# Patient Record
Sex: Female | Born: 1935
Health system: Southern US, Community
[De-identification: ages and names within clinical notes are randomized; demographics above are authoritative.]

## PROBLEM LIST (undated history)

## (undated) DIAGNOSIS — G629 Polyneuropathy, unspecified: Secondary | ICD-10-CM

## (undated) DIAGNOSIS — Z973 Presence of spectacles and contact lenses: Secondary | ICD-10-CM

## (undated) DIAGNOSIS — K589 Irritable bowel syndrome without diarrhea: Secondary | ICD-10-CM

## (undated) DIAGNOSIS — E278 Other specified disorders of adrenal gland: Secondary | ICD-10-CM

## (undated) DIAGNOSIS — I700 Atherosclerosis of aorta: Secondary | ICD-10-CM

## (undated) DIAGNOSIS — C649 Malignant neoplasm of unspecified kidney, except renal pelvis: Secondary | ICD-10-CM

## (undated) DIAGNOSIS — E7800 Pure hypercholesterolemia, unspecified: Secondary | ICD-10-CM

## (undated) DIAGNOSIS — M810 Age-related osteoporosis without current pathological fracture: Secondary | ICD-10-CM

## (undated) DIAGNOSIS — N183 Chronic kidney disease, stage 3 unspecified: Secondary | ICD-10-CM

## (undated) DIAGNOSIS — C799 Secondary malignant neoplasm of unspecified site: Secondary | ICD-10-CM

## (undated) DIAGNOSIS — I454 Nonspecific intraventricular block: Secondary | ICD-10-CM

## (undated) DIAGNOSIS — L989 Disorder of the skin and subcutaneous tissue, unspecified: Secondary | ICD-10-CM

## (undated) DIAGNOSIS — C679 Malignant neoplasm of bladder, unspecified: Secondary | ICD-10-CM

## (undated) DIAGNOSIS — N200 Calculus of kidney: Secondary | ICD-10-CM

## (undated) DIAGNOSIS — E782 Mixed hyperlipidemia: Secondary | ICD-10-CM

## (undated) DIAGNOSIS — Z972 Presence of dental prosthetic device (complete) (partial): Secondary | ICD-10-CM

## (undated) DIAGNOSIS — R600 Localized edema: Secondary | ICD-10-CM

## (undated) DIAGNOSIS — I10 Essential (primary) hypertension: Secondary | ICD-10-CM

## (undated) DIAGNOSIS — E039 Hypothyroidism, unspecified: Secondary | ICD-10-CM

## (undated) DIAGNOSIS — Z87442 Personal history of urinary calculi: Secondary | ICD-10-CM

## (undated) DIAGNOSIS — I8290 Acute embolism and thrombosis of unspecified vein: Secondary | ICD-10-CM

## (undated) DIAGNOSIS — I8393 Asymptomatic varicose veins of bilateral lower extremities: Secondary | ICD-10-CM

## (undated) DIAGNOSIS — I129 Hypertensive chronic kidney disease with stage 1 through stage 4 chronic kidney disease, or unspecified chronic kidney disease: Secondary | ICD-10-CM

## (undated) HISTORY — DX: Chronic kidney disease, stage 3 unspecified: N18.30

## (undated) HISTORY — DX: Irritable bowel syndrome, unspecified: K58.9

## (undated) HISTORY — PX: COLON SURGERY: SHX602

## (undated) HISTORY — PX: OTHER SURGICAL HISTORY: SHX169

## (undated) HISTORY — PX: ABDOMINAL HYSTERECTOMY: SHX81

## (undated) HISTORY — DX: Hypothyroidism, unspecified: E03.9

## (undated) HISTORY — DX: Hypertensive chronic kidney disease with stage 1 through stage 4 chronic kidney disease, or unspecified chronic kidney disease: I12.9

## (undated) HISTORY — DX: Atherosclerosis of aorta: I70.0

## (undated) HISTORY — DX: Mixed hyperlipidemia: E78.2

## (undated) HISTORY — DX: Malignant neoplasm of bladder, unspecified: C67.9

## (undated) HISTORY — DX: Calculus of kidney: N20.0

## (undated) HISTORY — PX: APPENDECTOMY: SHX54

## (undated) HISTORY — PX: CATARACT EXTRACTION: SUR2

## (undated) HISTORY — DX: Secondary malignant neoplasm of unspecified site: C79.9

## (undated) HISTORY — DX: Pure hypercholesterolemia, unspecified: E78.00

## (undated) HISTORY — DX: Other specified disorders of adrenal gland: E27.8

---

## 1988-09-27 HISTORY — PX: EYE SURGERY: SHX253

## 2009-05-12 HISTORY — PX: CATARACT EXTRACTION: SUR2

## 2009-11-19 HISTORY — PX: COLONOSCOPY: SHX174

## 2010-09-27 HISTORY — PX: KNEE SURGERY: SHX244

## 2017-01-25 HISTORY — PX: BLADDER SURGERY: SHX569

## 2018-04-26 DIAGNOSIS — Z01818 Encounter for other preprocedural examination: Secondary | ICD-10-CM | POA: Diagnosis not present

## 2018-05-30 DIAGNOSIS — N1330 Unspecified hydronephrosis: Secondary | ICD-10-CM | POA: Diagnosis not present

## 2018-05-30 DIAGNOSIS — B9689 Other specified bacterial agents as the cause of diseases classified elsewhere: Secondary | ICD-10-CM

## 2018-05-30 DIAGNOSIS — A09 Infectious gastroenteritis and colitis, unspecified: Secondary | ICD-10-CM | POA: Diagnosis not present

## 2018-05-30 DIAGNOSIS — N3000 Acute cystitis without hematuria: Secondary | ICD-10-CM | POA: Diagnosis not present

## 2018-05-30 DIAGNOSIS — A0472 Enterocolitis due to Clostridium difficile, not specified as recurrent: Secondary | ICD-10-CM | POA: Diagnosis not present

## 2018-05-30 DIAGNOSIS — E871 Hypo-osmolality and hyponatremia: Secondary | ICD-10-CM

## 2018-05-31 DIAGNOSIS — A0472 Enterocolitis due to Clostridium difficile, not specified as recurrent: Secondary | ICD-10-CM | POA: Diagnosis not present

## 2018-05-31 DIAGNOSIS — A09 Infectious gastroenteritis and colitis, unspecified: Secondary | ICD-10-CM | POA: Diagnosis not present

## 2018-05-31 DIAGNOSIS — N1330 Unspecified hydronephrosis: Secondary | ICD-10-CM | POA: Diagnosis not present

## 2018-05-31 DIAGNOSIS — N3000 Acute cystitis without hematuria: Secondary | ICD-10-CM | POA: Diagnosis not present

## 2018-06-01 DIAGNOSIS — A0472 Enterocolitis due to Clostridium difficile, not specified as recurrent: Secondary | ICD-10-CM | POA: Diagnosis not present

## 2018-06-01 DIAGNOSIS — N1330 Unspecified hydronephrosis: Secondary | ICD-10-CM | POA: Diagnosis not present

## 2018-06-01 DIAGNOSIS — N3000 Acute cystitis without hematuria: Secondary | ICD-10-CM | POA: Diagnosis not present

## 2018-06-01 DIAGNOSIS — A09 Infectious gastroenteritis and colitis, unspecified: Secondary | ICD-10-CM | POA: Diagnosis not present

## 2018-06-02 DIAGNOSIS — N3000 Acute cystitis without hematuria: Secondary | ICD-10-CM | POA: Diagnosis not present

## 2018-06-02 DIAGNOSIS — A0472 Enterocolitis due to Clostridium difficile, not specified as recurrent: Secondary | ICD-10-CM | POA: Diagnosis not present

## 2018-06-02 DIAGNOSIS — N1330 Unspecified hydronephrosis: Secondary | ICD-10-CM | POA: Diagnosis not present

## 2018-06-02 DIAGNOSIS — A09 Infectious gastroenteritis and colitis, unspecified: Secondary | ICD-10-CM | POA: Diagnosis not present

## 2018-06-03 DIAGNOSIS — A09 Infectious gastroenteritis and colitis, unspecified: Secondary | ICD-10-CM | POA: Diagnosis not present

## 2018-06-03 DIAGNOSIS — N1330 Unspecified hydronephrosis: Secondary | ICD-10-CM | POA: Diagnosis not present

## 2018-06-03 DIAGNOSIS — A0472 Enterocolitis due to Clostridium difficile, not specified as recurrent: Secondary | ICD-10-CM | POA: Diagnosis not present

## 2018-06-03 DIAGNOSIS — N3000 Acute cystitis without hematuria: Secondary | ICD-10-CM | POA: Diagnosis not present

## 2018-06-04 DIAGNOSIS — A09 Infectious gastroenteritis and colitis, unspecified: Secondary | ICD-10-CM | POA: Diagnosis not present

## 2018-06-04 DIAGNOSIS — A0472 Enterocolitis due to Clostridium difficile, not specified as recurrent: Secondary | ICD-10-CM | POA: Diagnosis not present

## 2018-06-04 DIAGNOSIS — N3000 Acute cystitis without hematuria: Secondary | ICD-10-CM | POA: Diagnosis not present

## 2018-06-04 DIAGNOSIS — N1330 Unspecified hydronephrosis: Secondary | ICD-10-CM | POA: Diagnosis not present

## 2018-06-05 DIAGNOSIS — A0472 Enterocolitis due to Clostridium difficile, not specified as recurrent: Secondary | ICD-10-CM | POA: Diagnosis not present

## 2018-06-05 DIAGNOSIS — A09 Infectious gastroenteritis and colitis, unspecified: Secondary | ICD-10-CM | POA: Diagnosis not present

## 2018-06-05 DIAGNOSIS — N3000 Acute cystitis without hematuria: Secondary | ICD-10-CM | POA: Diagnosis not present

## 2018-06-05 DIAGNOSIS — N1330 Unspecified hydronephrosis: Secondary | ICD-10-CM | POA: Diagnosis not present

## 2018-08-22 ENCOUNTER — Encounter: Payer: Self-pay | Admitting: Gastroenterology

## 2018-08-31 ENCOUNTER — Encounter: Payer: Self-pay | Admitting: Gastroenterology

## 2018-09-08 ENCOUNTER — Encounter: Payer: Self-pay | Admitting: Gastroenterology

## 2018-09-11 ENCOUNTER — Encounter: Payer: Self-pay | Admitting: Gastroenterology

## 2018-09-11 ENCOUNTER — Ambulatory Visit: Payer: Medicare Other | Admitting: Gastroenterology

## 2018-09-11 VITALS — BP 148/80 | HR 62 | Ht 65.5 in | Wt 137.1 lb

## 2018-09-11 DIAGNOSIS — A498 Other bacterial infections of unspecified site: Secondary | ICD-10-CM | POA: Diagnosis not present

## 2018-09-11 MED ORDER — VANCOMYCIN HCL 125 MG PO CAPS: ORAL_CAPSULE | ORAL | 0 refills | Status: DC

## 2018-09-11 NOTE — Patient Instructions (Addendum)
If you are age 82 or older, your body mass index should be between 23-30. Your Body mass index is 22.47 kg/m. If this is out of the aforementioned range listed, please consider follow up with your Primary Care Provider.  If you are age 52 or younger, your body mass index should be between 19-25. Your Body mass index is 22.47 kg/m. If this is out of the aformentioned range listed, please consider follow up with your Primary Care Provider.   We have sent the following medications to your pharmacy for you to pick up at your convenience: Vancomycin 125 mg every other day x 14 days, then every 3rd day x 14 days.   Please call Dr. Leland Her nurse Karl Pock, RN)  in 2 weeks at [phone]  to let her now how you are doing.    Thank you,  Dr. Jackquline Denmark

## 2018-09-11 NOTE — Progress Notes (Signed)
Chief Complaint: Recurrent C. difficile diarrhea  Referring Provider: Dr. [name]      ASSESSMENT AND PLAN;   #1. Recurrent C. Diff (since 05/2018) 3 episodes.  Currently on pulsed vancomycin therapy. Last colon 10/2009 - neg except for mild pancolonic diverticulosis, s/p sigmoid resection.  Plan: - Vancomycin 125 mg every other day for 14 days followed by 125 mg p.o. every third day for 14 days. - Blood tests done last week at Dr. Nyra Capes office. - Discussed regarding Dificid and fecal transplantation.  Hopefully we will not need. - Avoid antibiotics. - Can continue probiotics for now. - She is to promptly call us if she starts having diarrhea after she finishes vancomycin. - Meanwhile, I will also find out if, we as a group, are enrolled in open-biome [facility] program for oral FMT capsules.    HPI:    Kayla Bowers is a 84 y.o. female  Took antibiotics for over a year due to recurrent urinary tract infections/bladder cancer Started having increasing diarrhea around September 2019 around Labor Day requiring hospitalization.  Found to have C. Difficile.  Treated with vancomycin.  Her diarrhea completely resolves as long as she is on vancomycin.  However, when she stops taking vancomycin it does return.  She had 3 episodes.  Currently on tapering vancomycin and is down to 1/day with last dose today.  No diarrhea now.  She has normal bowel movements.  Has been taking probiotics  Lost 3 pounds since September 2019  No melena or hematochezia  No pets at home   Past Medical History:  Diagnosis Date  . Bladder cancer ([place])   . Hypercholesteremia   . Kidney stone     Past Surgical History:  Procedure Laterality Date  . ABDOMINAL HYSTERECTOMY    . APPENDECTOMY    . BLADDER SURGERY  01/2017   removal of a tumor  . CATARACT EXTRACTION    . COLON SURGERY     took out 12 inches  . COLONOSCOPY  11/19/2009   Mild pancolonic diverticulitis. Status post sigmoid resection.  Small internal hemorrhoid. Otherwies normal colonsocopy.   Wilson Singer Tuck    . Vericose Vein Stripping      Family History  Problem Relation Age of Onset  . Colon cancer Neg Hx   . Esophageal cancer Neg Hx     Social History   Tobacco Use  . Smoking status: Former Research scientist (life sciences)  . Smokeless tobacco: Never Used  Substance Use Topics  . Alcohol use: Not Currently    Comment: quit 2 years ago   . Drug use: Never    Current Outpatient Medications  Medication Sig Dispense Refill  . FENOFIBRATE PO Take 1 tablet by mouth at bedtime.    Marland Kitchen HYDROCHLOROTHIAZIDE PO Take 1 tablet by mouth daily.    Marland Kitchen lactobacillus acidophilus (BACID) TABS tablet Take 1 tablet by mouth 2 (two) times daily.    . Multiple Vitamins-Minerals (MULTIVITAMIN ADULT PO) Take 1 tablet by mouth daily.    . Multiple Vitamins-Minerals (PRESERVISION AREDS PO) Take 1 tablet by mouth daily.    Marland Kitchen VANCOMYCIN HCL PO Take 1 tablet by mouth daily.     No current facility-administered medications for this visit.     Allergies  Allergen Reactions  . Flomax [Tamsulosin Hcl]     Review of Systems:  Constitutional: Denies fever, chills, diaphoresis, appetite change and fatigue.  HEENT: Denies photophobia, eye pain, redness, hearing loss, ear pain, congestion, sore throat, rhinorrhea, sneezing, mouth sores,  neck pain, neck stiffness and tinnitus.   Respiratory: Denies SOB, DOE, cough, chest tightness,  and wheezing.   Cardiovascular: Denies chest pain, palpitations and leg swelling.  Genitourinary: Denies dysuria, urgency, frequency, hematuria, flank pain and difficulty urinating.  Musculoskeletal: Denies myalgias, back pain, joint swelling, arthralgias and gait problem.  Skin: No rash.  Neurological: Denies dizziness, seizures, syncope, weakness, light-headedness, numbness and headaches.  Hematological: Denies adenopathy. Easy bruising, personal or family bleeding history  Psychiatric/Behavioral: No anxiety or depression      Physical Exam:    BP (!) 148/80   Pulse 62   Ht 5' 5.5" (1.664 m)   Wt 137 lb 2 oz (62.2 kg)   BMI 22.47 kg/m  Filed Weights   09/11/18 1334  Weight: 137 lb 2 oz (62.2 kg)   Constitutional:  Well-developed, in no acute distress. Psychiatric: Normal mood and affect. Behavior is normal. HEENT: Pupils normal.  Conjunctivae are normal. No scleral icterus. Neck supple.  Cardiovascular: Normal rate, regular rhythm. No edema Pulmonary/chest: Effort normal and breath sounds normal. No wheezing, rales or rhonchi. Abdominal: Soft, nondistended. Nontender. Bowel sounds active throughout. There are no masses palpable. No hepatomegaly.  Has incisional hernia. Rectal:  defered Neurological: Alert and oriented to person place and time. Skin: Skin is warm and dry. No rashes noted.    Carmell Austria, MD 09/11/2018, 2:03 PM  Cc: Dr. [name]

## 2018-10-12 ENCOUNTER — Telehealth: Payer: Self-pay | Admitting: Gastroenterology

## 2018-10-12 NOTE — Telephone Encounter (Signed)
Pt called to inform that she finished taking vancomycin yesterday.  Pt reports she is doing fine.  She reported one small episode when switching from qd to q3d.  FYI.

## 2018-10-12 NOTE — Telephone Encounter (Signed)
I am glad she is doing better I did discuss with the urology previously.  They will only treat again with antibiotics if she has significant symptoms and the cultures are positive. Drink plenty of water

## 2018-10-12 NOTE — Telephone Encounter (Signed)
Please review previous message and advise on next step in plan of care;  

## 2018-10-13 NOTE — Telephone Encounter (Signed)
Called and spoke with patient-patient informed of MD recommendations and is agreeable to plan of care; patient reports she is following up with urology on 11/23/2018 due to cancer cells showing back up in her urine sample; patient requested Dr. Lyndel Safe be made aware that she is being followed by urology due to those results; Patient verbalized understanding of information/instructions; Patient was advised to call back if questions/concerns arise;

## 2019-06-21 HISTORY — PX: KIDNEY STONE SURGERY: SHX686

## 2019-07-09 DIAGNOSIS — S22020A Wedge compression fracture of second thoracic vertebra, initial encounter for closed fracture: Secondary | ICD-10-CM | POA: Insufficient documentation

## 2019-07-09 HISTORY — DX: Wedge compression fracture of second thoracic vertebra, initial encounter for closed fracture: S22.020A

## 2019-10-04 DIAGNOSIS — I129 Hypertensive chronic kidney disease with stage 1 through stage 4 chronic kidney disease, or unspecified chronic kidney disease: Secondary | ICD-10-CM | POA: Diagnosis not present

## 2019-10-12 DIAGNOSIS — N183 Chronic kidney disease, stage 3 unspecified: Secondary | ICD-10-CM | POA: Diagnosis not present

## 2019-10-12 DIAGNOSIS — Z139 Encounter for screening, unspecified: Secondary | ICD-10-CM | POA: Diagnosis not present

## 2019-10-12 DIAGNOSIS — C679 Malignant neoplasm of bladder, unspecified: Secondary | ICD-10-CM | POA: Diagnosis not present

## 2019-10-12 DIAGNOSIS — Z0000 Encounter for general adult medical examination without abnormal findings: Secondary | ICD-10-CM | POA: Diagnosis not present

## 2019-10-12 DIAGNOSIS — Z1339 Encounter for screening examination for other mental health and behavioral disorders: Secondary | ICD-10-CM | POA: Diagnosis not present

## 2019-10-12 DIAGNOSIS — E278 Other specified disorders of adrenal gland: Secondary | ICD-10-CM | POA: Diagnosis not present

## 2019-10-12 DIAGNOSIS — I129 Hypertensive chronic kidney disease with stage 1 through stage 4 chronic kidney disease, or unspecified chronic kidney disease: Secondary | ICD-10-CM | POA: Diagnosis not present

## 2019-10-12 DIAGNOSIS — Z1331 Encounter for screening for depression: Secondary | ICD-10-CM | POA: Diagnosis not present

## 2019-10-12 DIAGNOSIS — Z136 Encounter for screening for cardiovascular disorders: Secondary | ICD-10-CM | POA: Diagnosis not present

## 2019-10-22 DIAGNOSIS — E279 Disorder of adrenal gland, unspecified: Secondary | ICD-10-CM | POA: Diagnosis not present

## 2019-10-22 DIAGNOSIS — D3502 Benign neoplasm of left adrenal gland: Secondary | ICD-10-CM | POA: Diagnosis not present

## 2019-10-22 DIAGNOSIS — D3501 Benign neoplasm of right adrenal gland: Secondary | ICD-10-CM | POA: Diagnosis not present

## 2019-10-22 DIAGNOSIS — E278 Other specified disorders of adrenal gland: Secondary | ICD-10-CM | POA: Diagnosis not present

## 2019-10-23 DIAGNOSIS — N183 Chronic kidney disease, stage 3 unspecified: Secondary | ICD-10-CM | POA: Diagnosis not present

## 2019-10-23 DIAGNOSIS — J90 Pleural effusion, not elsewhere classified: Secondary | ICD-10-CM | POA: Diagnosis not present

## 2019-10-23 DIAGNOSIS — Z6823 Body mass index (BMI) 23.0-23.9, adult: Secondary | ICD-10-CM | POA: Diagnosis not present

## 2019-10-23 DIAGNOSIS — R935 Abnormal findings on diagnostic imaging of other abdominal regions, including retroperitoneum: Secondary | ICD-10-CM | POA: Diagnosis not present

## 2019-11-01 DIAGNOSIS — S22020D Wedge compression fracture of second thoracic vertebra, subsequent encounter for fracture with routine healing: Secondary | ICD-10-CM | POA: Diagnosis not present

## 2019-11-01 DIAGNOSIS — S22028D Other fracture of second thoracic vertebra, subsequent encounter for fracture with routine healing: Secondary | ICD-10-CM | POA: Diagnosis not present

## 2019-11-09 DIAGNOSIS — J939 Pneumothorax, unspecified: Secondary | ICD-10-CM | POA: Diagnosis not present

## 2019-11-09 DIAGNOSIS — R935 Abnormal findings on diagnostic imaging of other abdominal regions, including retroperitoneum: Secondary | ICD-10-CM | POA: Diagnosis not present

## 2019-11-09 DIAGNOSIS — J90 Pleural effusion, not elsewhere classified: Secondary | ICD-10-CM | POA: Diagnosis not present

## 2019-11-09 DIAGNOSIS — N200 Calculus of kidney: Secondary | ICD-10-CM | POA: Diagnosis not present

## 2019-11-15 DIAGNOSIS — E278 Other specified disorders of adrenal gland: Secondary | ICD-10-CM | POA: Diagnosis not present

## 2019-11-15 DIAGNOSIS — J90 Pleural effusion, not elsewhere classified: Secondary | ICD-10-CM | POA: Diagnosis not present

## 2019-11-15 DIAGNOSIS — Z6823 Body mass index (BMI) 23.0-23.9, adult: Secondary | ICD-10-CM | POA: Diagnosis not present

## 2019-11-16 DIAGNOSIS — H353132 Nonexudative age-related macular degeneration, bilateral, intermediate dry stage: Secondary | ICD-10-CM | POA: Diagnosis not present

## 2019-11-25 DIAGNOSIS — I700 Atherosclerosis of aorta: Secondary | ICD-10-CM | POA: Diagnosis not present

## 2019-11-25 DIAGNOSIS — I129 Hypertensive chronic kidney disease with stage 1 through stage 4 chronic kidney disease, or unspecified chronic kidney disease: Secondary | ICD-10-CM | POA: Diagnosis not present

## 2019-11-25 DIAGNOSIS — N183 Chronic kidney disease, stage 3 unspecified: Secondary | ICD-10-CM | POA: Diagnosis not present

## 2019-12-06 DIAGNOSIS — D4411 Neoplasm of uncertain behavior of right adrenal gland: Secondary | ICD-10-CM | POA: Diagnosis not present

## 2019-12-06 DIAGNOSIS — D3001 Benign neoplasm of right kidney: Secondary | ICD-10-CM | POA: Diagnosis not present

## 2019-12-06 DIAGNOSIS — Z8551 Personal history of malignant neoplasm of bladder: Secondary | ICD-10-CM | POA: Diagnosis not present

## 2019-12-06 DIAGNOSIS — R8279 Other abnormal findings on microbiological examination of urine: Secondary | ICD-10-CM | POA: Diagnosis not present

## 2019-12-07 ENCOUNTER — Other Ambulatory Visit: Payer: Self-pay | Admitting: Urology

## 2019-12-07 DIAGNOSIS — D3501 Benign neoplasm of right adrenal gland: Secondary | ICD-10-CM

## 2019-12-07 DIAGNOSIS — N3020 Other chronic cystitis without hematuria: Secondary | ICD-10-CM | POA: Diagnosis not present

## 2019-12-07 DIAGNOSIS — N3090 Cystitis, unspecified without hematuria: Secondary | ICD-10-CM | POA: Diagnosis not present

## 2019-12-07 DIAGNOSIS — N1330 Unspecified hydronephrosis: Secondary | ICD-10-CM | POA: Diagnosis not present

## 2019-12-07 DIAGNOSIS — C674 Malignant neoplasm of posterior wall of bladder: Secondary | ICD-10-CM | POA: Diagnosis not present

## 2019-12-14 DIAGNOSIS — D4411 Neoplasm of uncertain behavior of right adrenal gland: Secondary | ICD-10-CM | POA: Diagnosis not present

## 2019-12-20 DIAGNOSIS — D4411 Neoplasm of uncertain behavior of right adrenal gland: Secondary | ICD-10-CM | POA: Diagnosis not present

## 2019-12-26 DIAGNOSIS — I700 Atherosclerosis of aorta: Secondary | ICD-10-CM | POA: Diagnosis not present

## 2019-12-26 DIAGNOSIS — N183 Chronic kidney disease, stage 3 unspecified: Secondary | ICD-10-CM | POA: Diagnosis not present

## 2019-12-26 DIAGNOSIS — I129 Hypertensive chronic kidney disease with stage 1 through stage 4 chronic kidney disease, or unspecified chronic kidney disease: Secondary | ICD-10-CM | POA: Diagnosis not present

## 2020-01-01 ENCOUNTER — Ambulatory Visit
Admission: RE | Admit: 2020-01-01 | Discharge: 2020-01-01 | Disposition: A | Discharge status: Home / Self Care | Payer: Medicare PPO | Source: Ambulatory Visit | Attending: Urology | Admitting: Urology

## 2020-01-01 DIAGNOSIS — E279 Disorder of adrenal gland, unspecified: Secondary | ICD-10-CM | POA: Diagnosis not present

## 2020-01-01 DIAGNOSIS — N2889 Other specified disorders of kidney and ureter: Secondary | ICD-10-CM | POA: Diagnosis not present

## 2020-01-01 DIAGNOSIS — D3501 Benign neoplasm of right adrenal gland: Secondary | ICD-10-CM

## 2020-01-01 MED ORDER — GADOBENATE DIMEGLUMINE 529 MG/ML IV SOLN
12.0000 mL | Freq: Once | INTRAVENOUS | Status: AC | PRN
  Administered 2020-01-01: 12 mL via INTRAVENOUS

## 2020-01-10 DIAGNOSIS — D49511 Neoplasm of unspecified behavior of right kidney: Secondary | ICD-10-CM | POA: Diagnosis not present

## 2020-01-10 DIAGNOSIS — Z8551 Personal history of malignant neoplasm of bladder: Secondary | ICD-10-CM | POA: Diagnosis not present

## 2020-01-10 DIAGNOSIS — D4411 Neoplasm of uncertain behavior of right adrenal gland: Secondary | ICD-10-CM | POA: Diagnosis not present

## 2020-01-22 ENCOUNTER — Other Ambulatory Visit: Payer: Self-pay

## 2020-01-22 ENCOUNTER — Encounter (HOSPITAL_BASED_OUTPATIENT_CLINIC_OR_DEPARTMENT_OTHER): Payer: Self-pay | Admitting: Urology

## 2020-01-22 NOTE — Progress Notes (Signed)
Spoke w/ via phone for pre-op interview--- [facility] needs dos---- ISTAT8, EKG              Lab results------ COVID test ------01/26/2020 @ 0945 Arrive at -------0630 NPO after ------MN Medications to take morning of surgery -----NONE Diabetic medication -----NONE Patient Special Instructions -----NONE Pre-Op special Istructions -----NONE Patient verbalized understanding of instructions that were given at this phone interview. Patient denies shortness of breath, chest pain, fever, cough a this phone interview.

## 2020-01-25 DIAGNOSIS — I129 Hypertensive chronic kidney disease with stage 1 through stage 4 chronic kidney disease, or unspecified chronic kidney disease: Secondary | ICD-10-CM | POA: Diagnosis not present

## 2020-01-25 DIAGNOSIS — I700 Atherosclerosis of aorta: Secondary | ICD-10-CM | POA: Diagnosis not present

## 2020-01-26 ENCOUNTER — Other Ambulatory Visit (HOSPITAL_COMMUNITY)
Admission: RE | Admit: 2020-01-26 | Discharge: 2020-01-26 | Disposition: A | Discharge status: Home / Self Care | Payer: Medicare PPO | Source: Ambulatory Visit | Attending: Urology | Admitting: Urology

## 2020-01-26 DIAGNOSIS — Z01812 Encounter for preprocedural laboratory examination: Secondary | ICD-10-CM | POA: Insufficient documentation

## 2020-01-26 DIAGNOSIS — Z20822 Contact with and (suspected) exposure to covid-19: Secondary | ICD-10-CM | POA: Diagnosis not present

## 2020-01-26 LAB — SARS CORONAVIRUS 2 (TAT 6-24 HRS): SARS Coronavirus 2: NEGATIVE

## 2020-01-29 ENCOUNTER — Other Ambulatory Visit: Payer: Self-pay | Admitting: Urology

## 2020-01-29 NOTE — Anesthesia Preprocedure Evaluation (Addendum)
Anesthesia Evaluation  Patient identified by MRN, date of birth, ID band Patient awake    Reviewed: Allergy & Precautions, NPO status , Patient's Chart, lab work & pertinent test results  History of Anesthesia Complications Negative for: history of anesthetic complications  Airway Mallampati: II  TM Distance: >3 FB Neck ROM: Full    Dental  (+) Dental Advisory Given, Missing, Loose   Pulmonary former smoker,    Pulmonary exam normal        Cardiovascular hypertension, Pt. on medications Normal cardiovascular exam     Neuro/Psych negative neurological ROS  negative psych ROS   GI/Hepatic negative GI ROS, Neg liver ROS,   Endo/Other  negative endocrine ROS  Renal/GU negative Renal ROS    Bladder cancer     Musculoskeletal negative musculoskeletal ROS (+)   Abdominal   Peds  Hematology negative hematology ROS (+)   Anesthesia Other Findings Covid neg 5/1   Reproductive/Obstetrics                            Anesthesia Physical Anesthesia Plan  ASA: II  Anesthesia Plan: General   Post-op Pain Management:    Induction: Intravenous  PONV Risk Score and Plan: 3 and Treatment may vary due to age or medical condition and Ondansetron  Airway Management Planned: LMA  Additional Equipment: None  Intra-op Plan:   Post-operative Plan: Extubation in OR  Informed Consent: I have reviewed the patients History and Physical, chart, labs and discussed the procedure including the risks, benefits and alternatives for the proposed anesthesia with the patient or authorized representative who has indicated his/her understanding and acceptance.     Dental advisory given  Plan Discussed with: CRNA and Anesthesiologist  Anesthesia Plan Comments:        Anesthesia Quick Evaluation

## 2020-01-30 ENCOUNTER — Encounter (HOSPITAL_BASED_OUTPATIENT_CLINIC_OR_DEPARTMENT_OTHER)
Admission: RE | Disposition: A | Discharge status: Home / Self Care | Payer: Self-pay | Source: Ambulatory Visit | Attending: Urology

## 2020-01-30 ENCOUNTER — Ambulatory Visit (HOSPITAL_BASED_OUTPATIENT_CLINIC_OR_DEPARTMENT_OTHER): Payer: Medicare PPO | Admitting: Anesthesiology

## 2020-01-30 ENCOUNTER — Other Ambulatory Visit: Payer: Self-pay

## 2020-01-30 ENCOUNTER — Ambulatory Visit (HOSPITAL_BASED_OUTPATIENT_CLINIC_OR_DEPARTMENT_OTHER)
Admission: RE | Admit: 2020-01-30 | Discharge: 2020-01-30 | Disposition: A | Discharge status: Home / Self Care | Payer: Medicare PPO | Source: Ambulatory Visit | Attending: Urology | Admitting: Urology

## 2020-01-30 ENCOUNTER — Encounter (HOSPITAL_BASED_OUTPATIENT_CLINIC_OR_DEPARTMENT_OTHER): Payer: Self-pay | Admitting: Urology

## 2020-01-30 DIAGNOSIS — Z888 Allergy status to other drugs, medicaments and biological substances status: Secondary | ICD-10-CM | POA: Diagnosis not present

## 2020-01-30 DIAGNOSIS — C651 Malignant neoplasm of right renal pelvis: Secondary | ICD-10-CM | POA: Insufficient documentation

## 2020-01-30 DIAGNOSIS — C679 Malignant neoplasm of bladder, unspecified: Secondary | ICD-10-CM | POA: Diagnosis not present

## 2020-01-30 DIAGNOSIS — Z79899 Other long term (current) drug therapy: Secondary | ICD-10-CM | POA: Insufficient documentation

## 2020-01-30 DIAGNOSIS — N200 Calculus of kidney: Secondary | ICD-10-CM | POA: Insufficient documentation

## 2020-01-30 DIAGNOSIS — N2889 Other specified disorders of kidney and ureter: Secondary | ICD-10-CM | POA: Diagnosis not present

## 2020-01-30 DIAGNOSIS — Z87891 Personal history of nicotine dependence: Secondary | ICD-10-CM | POA: Diagnosis not present

## 2020-01-30 DIAGNOSIS — D49511 Neoplasm of unspecified behavior of right kidney: Secondary | ICD-10-CM | POA: Diagnosis not present

## 2020-01-30 DIAGNOSIS — I10 Essential (primary) hypertension: Secondary | ICD-10-CM | POA: Insufficient documentation

## 2020-01-30 DIAGNOSIS — Z9049 Acquired absence of other specified parts of digestive tract: Secondary | ICD-10-CM | POA: Insufficient documentation

## 2020-01-30 DIAGNOSIS — E7800 Pure hypercholesterolemia, unspecified: Secondary | ICD-10-CM | POA: Diagnosis not present

## 2020-01-30 DIAGNOSIS — R1900 Intra-abdominal and pelvic swelling, mass and lump, unspecified site: Secondary | ICD-10-CM | POA: Diagnosis present

## 2020-01-30 HISTORY — DX: Essential (primary) hypertension: I10

## 2020-01-30 HISTORY — PX: CYSTOSCOPY WITH URETEROSCOPY AND STENT PLACEMENT: SHX6377

## 2020-01-30 HISTORY — DX: Personal history of urinary calculi: Z87.442

## 2020-01-30 LAB — POCT I-STAT, CHEM 8
BUN: 22 mg/dL (ref 8–23)
Calcium, Ion: 1.39 mmol/L (ref 1.15–1.40)
Chloride: 100 mmol/L (ref 98–111)
Creatinine, Ser: 0.9 mg/dL (ref 0.44–1.00)
Glucose, Bld: 89 mg/dL (ref 70–99)
HCT: 40 % (ref 36.0–46.0)
Hemoglobin: 13.6 g/dL (ref 12.0–15.0)
Potassium: 3.8 mmol/L (ref 3.5–5.1)
Sodium: 139 mmol/L (ref 135–145)
TCO2: 31 mmol/L (ref 22–32)

## 2020-01-30 SURGERY — CYSTOURETEROSCOPY, WITH STENT INSERTION
Anesthesia: General | Site: Pelvis | Laterality: Right

## 2020-01-30 MED ORDER — ONDANSETRON HCL 4 MG/2ML IJ SOLN
INTRAMUSCULAR | Status: DC | PRN
  Administered 2020-01-30: 4 mg via INTRAVENOUS

## 2020-01-30 MED ORDER — DEXAMETHASONE SODIUM PHOSPHATE 10 MG/ML IJ SOLN
INTRAMUSCULAR | Status: AC
  Filled 2020-01-30: qty 1

## 2020-01-30 MED ORDER — LIDOCAINE 2% (20 MG/ML) 5 ML SYRINGE
INTRAMUSCULAR | Status: DC | PRN
  Administered 2020-01-30: 60 mg via INTRAVENOUS

## 2020-01-30 MED ORDER — SODIUM CHLORIDE 0.9 % IR SOLN
Status: DC | PRN
  Administered 2020-01-30: 3000 mL via INTRAVESICAL

## 2020-01-30 MED ORDER — ONDANSETRON HCL 4 MG/2ML IJ SOLN
INTRAMUSCULAR | Status: AC
  Filled 2020-01-30: qty 2

## 2020-01-30 MED ORDER — LACTATED RINGERS IV SOLN: INTRAVENOUS | Status: DC

## 2020-01-30 MED ORDER — IOHEXOL 300 MG/ML  SOLN
INTRAMUSCULAR | Status: DC | PRN
  Administered 2020-01-30: 10 mL via URETHRAL

## 2020-01-30 MED ORDER — PROPOFOL 500 MG/50ML IV EMUL: INTRAVENOUS | Status: DC | PRN

## 2020-01-30 MED ORDER — LIDOCAINE 2% (20 MG/ML) 5 ML SYRINGE
INTRAMUSCULAR | Status: AC
  Filled 2020-01-30: qty 5

## 2020-01-30 MED ORDER — CEFAZOLIN SODIUM-DEXTROSE 2-4 GM/100ML-% IV SOLN
INTRAVENOUS | Status: AC
  Filled 2020-01-30: qty 100

## 2020-01-30 MED ORDER — EPHEDRINE SULFATE-NACL 50-0.9 MG/10ML-% IV SOSY
PREFILLED_SYRINGE | INTRAVENOUS | Status: DC | PRN
  Administered 2020-01-30 (×2): 10 mg via INTRAVENOUS

## 2020-01-30 MED ORDER — TRAMADOL HCL 50 MG PO TABS: 50.0000 mg | ORAL_TABLET | Freq: Four times a day (QID) | ORAL | 0 refills | Status: AC | PRN

## 2020-01-30 MED ORDER — PROPOFOL 500 MG/50ML IV EMUL
INTRAVENOUS | Status: DC | PRN
  Administered 2020-01-30: 100 mg via INTRAVENOUS

## 2020-01-30 MED ORDER — FENTANYL CITRATE (PF) 100 MCG/2ML IJ SOLN
INTRAMUSCULAR | Status: AC
  Filled 2020-01-30: qty 2

## 2020-01-30 MED ORDER — PROPOFOL 10 MG/ML IV BOLUS
INTRAVENOUS | Status: AC
  Filled 2020-01-30: qty 20

## 2020-01-30 MED ORDER — FENTANYL CITRATE (PF) 100 MCG/2ML IJ SOLN
INTRAMUSCULAR | Status: DC | PRN
  Administered 2020-01-30: 50 ug via INTRAVENOUS

## 2020-01-30 MED ORDER — DEXAMETHASONE SODIUM PHOSPHATE 10 MG/ML IJ SOLN
INTRAMUSCULAR | Status: DC | PRN
  Administered 2020-01-30: 10 mg via INTRAVENOUS

## 2020-01-30 MED ORDER — CEFAZOLIN SODIUM-DEXTROSE 2-4 GM/100ML-% IV SOLN
2.0000 g | Freq: Once | INTRAVENOUS | Status: AC
  Administered 2020-01-30: 2 g via INTRAVENOUS

## 2020-01-30 MED ORDER — CEPHALEXIN 500 MG PO CAPS
500.0000 mg | ORAL_CAPSULE | Freq: Two times a day (BID) | ORAL | 0 refills | Status: AC
Start: 2020-01-30 — End: 2020-02-02

## 2020-01-30 SURGICAL SUPPLY — 29 items
BAG DRAIN URO-CYSTO SKYTR STRL (DRAIN) ×2 IMPLANT
BASKET STONE 1.7 NGAGE (UROLOGICAL SUPPLIES) IMPLANT
BASKET ZERO TIP NITINOL 2.4FR (BASKET) ×1 IMPLANT
BENZOIN TINCTURE PRP APPL 2/3 (GAUZE/BANDAGES/DRESSINGS) IMPLANT
BRUSH URET BIOPSY 3F (UROLOGICAL SUPPLIES) ×1 IMPLANT
CABLE HIGH FREQUENCY MONO STRZ (ELECTRODE) ×1 IMPLANT
CATH URET 5FR 28IN OPEN ENDED (CATHETERS) ×1 IMPLANT
CLOTH BEACON ORANGE TIMEOUT ST (SAFETY) ×2 IMPLANT
CNTNR URN SCR LID CUP LEK RST (MISCELLANEOUS) IMPLANT
CONT SPEC 4OZ STRL OR WHT (MISCELLANEOUS) ×2
ELECT COAG BALL END 3FR (ELECTROSURGICAL) ×2
ELECTRODE COAG BALL END 3FR (ELECTROSURGICAL) IMPLANT
FIBER LASER FLEXIVA 365 (UROLOGICAL SUPPLIES) IMPLANT
FIBER LASER TRAC TIP (UROLOGICAL SUPPLIES) IMPLANT
GLOVE BIO SURGEON STRL SZ7.5 (GLOVE) ×2 IMPLANT
GOWN STRL REUS W/TWL XL LVL3 (GOWN DISPOSABLE) ×2 IMPLANT
GUIDEWIRE STR DUAL SENSOR (WIRE) IMPLANT
GUIDEWIRE ZIPWRE .038 STRAIGHT (WIRE) ×3 IMPLANT
IV NS IRRIG 3000ML ARTHROMATIC (IV SOLUTION) ×3 IMPLANT
KIT TURNOVER CYSTO (KITS) ×2 IMPLANT
MANIFOLD NEPTUNE II (INSTRUMENTS) ×2 IMPLANT
NDL SAFETY ECLIPSE 18X1.5 (NEEDLE) IMPLANT
NEEDLE HYPO 18GX1.5 SHARP (NEEDLE) ×1
NS IRRIG 500ML POUR BTL (IV SOLUTION) ×2 IMPLANT
STRIP CLOSURE SKIN 1/2X4 (GAUZE/BANDAGES/DRESSINGS) IMPLANT
SYR 10ML LL (SYRINGE) ×2 IMPLANT
TRAY CYSTO PACK (CUSTOM PROCEDURE TRAY) ×2 IMPLANT
TUBE CONNECTING 12X1/4 (SUCTIONS) ×1 IMPLANT
TUBING UROLOGY SET (TUBING) ×2 IMPLANT

## 2020-01-30 NOTE — Anesthesia Postprocedure Evaluation (Signed)
Anesthesia Post Note  Patient: Kayla Bowers  Procedure(s) Performed: CYSTOSCOPY WITH RIGHT URETEROSCOPY/BIOPSY (Right Pelvis)     Patient location during evaluation: PACU Anesthesia Type: General Level of consciousness: awake and alert Pain management: pain level controlled Vital Signs Assessment: post-procedure vital signs reviewed and stable Respiratory status: spontaneous breathing, nonlabored ventilation and respiratory function stable Cardiovascular status: blood pressure returned to baseline and stable Postop Assessment: no apparent nausea or vomiting Anesthetic complications: no    Last Vitals:  Vitals:   01/30/20 0950 01/30/20 1003  BP:    Pulse: 75   Resp: 20   Temp:  (!) 36.3 C  SpO2: 100%     Last Pain:  Vitals:   01/30/20 1003  TempSrc: Oral  PainSc:                  Audry Pili

## 2020-01-30 NOTE — Anesthesia Procedure Notes (Signed)
Procedure Name: LMA Insertion Date/Time: 01/30/2020 8:23 AM Performed by: Gerald Leitz, CRNA Pre-anesthesia Checklist: Patient identified, Patient being monitored, Timeout performed, Emergency Drugs available and Suction available Patient Re-evaluated:Patient Re-evaluated prior to induction Oxygen Delivery Method: Circle system utilized Preoxygenation: Pre-oxygenation with 100% oxygen Induction Type: IV induction Ventilation: Mask ventilation without difficulty LMA: LMA inserted LMA Size: 3.0 Tube type: Oral Number of attempts: 1 Placement Confirmation: positive ETCO2 and breath sounds checked- equal and bilateral Tube secured with: Tape Dental Injury: Teeth and Oropharynx as per pre-operative assessment

## 2020-01-30 NOTE — H&P (Signed)
PRE-OP H&P  Office Visit Report     01/10/2020   --------------------------------------------------------------------------------   Kayla Bowers  MRN: [id number]  DOB: 03-16-1929, 86 year old Female  SSN:    PRIMARY CARE:  Kayla Senegal, MD  REFERRING:  Kayla Senegal, MD  PROVIDER:  Ellison Bowers, M.D.  LOCATION:  [facility] [phone]     --------------------------------------------------------------------------------   CC/HPI: Right renal pelvis mass   Kayla Bowers is an 86 year old female with a history of bladder cancer that was diagnosed and treated by Kayla Bowers in 2018. The patient has been referred by Kayla Bowers after she was found to have multiple lesions involving the right kidney concerning for possible urothelial carcinoma along with a 2 cm right adrenal nodule on recent MRI. CT with contrast from 10/2019 made no note of any solid and enhancing right renal parenchymal lesions. Per her records, she underwent right ureteroscopy in September of 2020 for kidney stones and a biopsy of a lesion in the right renal pelvis was performed, but no pathology report is available for review.   Past surgical history is significant for an exploratory laparotomy following an episode of diverticulitis in 2010.   Today, the patient denies interval episodes of gross hematuria, flank pain or dysuria.     ALLERGIES: Flomax    MEDICATIONS: Hydrochlorothiazide  Fenofibrate  Multivitamin  Probiotic     GU PSH: None     PSH Notes: Abdominoplasty  Varicose Veins     NON-GU PSH: Hysterectomy Partial Remove Colon Tonsillectomy     GU PMH: Benign tumor right kidney - 12/06/2019 History of bladder cancer - 12/06/2019 Renal calculus      PMH Notes: C-diff  Spine fracture   NON-GU PMH: Right adrenal neoplasm - 12/06/2019 DVT, History    FAMILY HISTORY: 1 Daughter - Other 1 son - Other   SOCIAL HISTORY: Marital Status: Married Preferred Language:  English; Race: White Current Smoking Status: Patient does not smoke anymore. Has not smoked since 11/26/1994.   Tobacco Use Assessment Completed: Used Tobacco in last 30 days? Drinks 4+ caffeinated drinks per day.    REVIEW OF SYSTEMS:    GU Review Female:   Patient denies frequent urination, hard to postpone urination, burning /pain with urination, get up at night to urinate, leakage of urine, stream starts and stops, trouble starting your stream, have to strain to urinate, and being pregnant.  Gastrointestinal (Upper):   Patient denies nausea, vomiting, and indigestion/ heartburn.  Gastrointestinal (Lower):   Patient denies diarrhea and constipation.  Constitutional:   Patient denies fever, night sweats, weight loss, and fatigue.  Skin:   Patient denies skin rash/ lesion and itching.  Eyes:   Patient denies blurred vision and double vision.  Ears/ Nose/ Throat:   Patient denies sore throat and sinus problems.  Hematologic/Lymphatic:   Patient denies swollen glands and easy bruising.  Cardiovascular:   Patient denies leg swelling and chest pains.  Respiratory:   Patient denies shortness of breath and cough.  Endocrine:   Patient denies excessive thirst.  Musculoskeletal:   Patient denies back pain and joint pain.  Neurological:   Patient denies headaches and dizziness.  Psychologic:   Patient denies depression and anxiety.   VITAL SIGNS:      01/10/2020 03:46 PM  Weight 140 lb / 63.5 kg  Height 65 in / 165.1 cm  BP 180/75 mmHg  Pulse 76 /min  Temperature 98.3 F / 36.8 C  BMI 23.3 kg/m  MULTI-SYSTEM PHYSICAL EXAMINATION:    Constitutional: Well-nourished. No physical deformities. Normally developed. Good grooming.  Neurologic / Psychiatric: Oriented to time, oriented to place, oriented to person. No depression, no anxiety, no agitation.  Musculoskeletal: Normal gait and station of head and neck.     Complexity of Data:  X-Ray Review: C.T. Abdomen/Pelvis: Reviewed Films.  Reviewed Report. Discussed With Patient.  C.T. Chest: Reviewed Films. Reviewed Report. Discussed With Patient.  MRI Abdomen: Reviewed Films. Reviewed Report. Discussed With Patient.    Notes:                     CLINICAL DATA: New right adrenal mass   EXAM:  MRI ABDOMEN WITHOUT AND WITH CONTRAST   TECHNIQUE:  Multiplanar multisequence MR imaging of the abdomen was performed  both before and after the administration of intravenous contrast.   CONTRAST: 59mL MULTIHANCE GADOBENATE DIMEGLUMINE 529 MG/ML IV SOLN   COMPARISON: CT abdomen dated to 08/2020. CT abdomen/pelvis dated  06/15/2019.   FINDINGS:  Motion degraded images.   Lower chest: Small right pleural effusion.   Hepatobiliary: Liver is within normal limits. No  suspicious/enhancing hepatic lesions. No hepatic steatosis. No  morphologic findings of cirrhosis.   Gallbladder is unremarkable. No intrahepatic or extrahepatic ductal  dilatation.   Pancreas: Within normal limits.   Spleen: Within normal limits.   Adrenals/Urinary Tract: 2.1 x 2.2 cm right adrenal mass (series  6/image 24), measuring 1.9 x 2.2 cm on recent CTA but new from  September 2020. Additional 1.4 x 1.6 cm enhancing medial right  adrenal nodule (series 6/image 21), 1.3 x 1.6 cm on recent CTA but  new from 2020. No intracellular lipid on opposed phase imaging.  These are both considered suspicious for metastatic disease.   16 mm left adrenal adenoma, benign.   Malrotated right kidney with enhancing soft tissue in the posterior  right upper pole collecting system measuring 1.8 x 2.0 cm (series  9/image 24), highly suspicious for urothelial neoplasm such as  transitional cell carcinoma. Associated restricted diffusion (series  7/image 80; series 8/image 20).   Tiny left renal cysts. No hydronephrosis.   Stomach/Bowel: Stomach and visualized bowel are grossly  unremarkable.   Vascular/Lymphatic: No evidence of abdominal aortic aneurysm.   No  suspicious abdominal lymphadenopathy.   Other: No abdominal ascites.   Musculoskeletal: No focal osseous lesions.   IMPRESSION:  Motion degraded images.   1.8 x 2.0 cm enhancing lesion in the right upper pole renal  collecting system, highly suspicious for urothelial neoplasm such as  transitional cell carcinoma.   Two right renal masses measuring up to 2.2 cm, new from September  2020, compatible with metastatic disease.   Small right pleural effusion.    Electronically Signed  By: Julian Hy M.D.  On: 01/02/2020 09:43  __________________________________________________________________________________________________  CLINICAL DATA: Right suprarenal enlarging lesion, rule out  aneurysm.   EXAM:  CT ANGIOGRAPHY ABDOMEN   TECHNIQUE:  Multidetector CT imaging of the abdomen was performed using the  standard protocol during bolus administration of intravenous  contrast. Multiplanar reconstructed images and MIPs were obtained  and reviewed to evaluate the vascular anatomy.   CONTRAST: 100 mL Isovue 370 IV   COMPARISON: 10/22/2019   FINDINGS:  VASCULAR   Aorta: Moderately heavy calcified atheromatous plaque. No aneurysm,  dissection, or stenosis.   Celiac: Patent without evidence of aneurysm, dissection, vasculitis  or significant stenosis.   SMA: Eccentric plaque just beyond the origin resulting in mild  short-segment  stenosis, patent distally with classic distal branch  anatomy.   Renals: Single left, with calcified ostial plaque, no high-grade  stenosis.   Duplicated right, superior dominant, with heavily calcified ostial  plaque resulting in short segment stenosis of possible hemodynamic  significance. Inferior right renal artery is diminutive, supplying a  portion of the lower pole.   IMA: Diminutive, patent   Inflow: Calcified plaque in the visualized proximal common iliac  arteries without aneurysm or stenosis.   Veins: Patent hepatic veins,  portal vein, splenic vein, SMV,  bilateral renal veins. No venous pathology is identified.   Review of the MIP images confirms the above findings.   NON-VASCULAR   Lower chest: Persistent moderate right pleural effusion with  atelectasis/consolidation at the right lung base.   Hepatobiliary: No focal liver abnormality is seen. No gallstones,  gallbladder wall thickening, or biliary dilatation.   Pancreas: Unremarkable. No pancreatic ductal dilatation or  surrounding inflammatory changes.   Spleen: Normal in size without focal abnormality.   Adrenals/Urinary Tract: Enhancing 2.3 cm right adrenal mass, new  since 06/14/2019. Smaller low-attenuation left adrenal nodule  present since 04/07/2018 possibly adenoma.   Ptotic right kidney. Hyperdense right upper pole renal collecting  system with coarse calcifications measuring up to 6 mm. No  hydronephrosis.   Stomach/Bowel: Stomach is nondistended. Visualized portions of small  bowel and colon decompressed, unremarkable.   Lymphatic: No abdominal or pelvic adenopathy.   Other: No ascites. No free air.   Musculoskeletal: Surgical clips in the left retroperitoneum.  Spondylitic changes in the lumbar spine with a mild thoracolumbar  levoscoliosis apex L1, no underlying vertebral anomaly.   IMPRESSION:  VASCULAR   1. Negative for aneurysm.  2. Right renal artery ostial stenosis of possible hemodynamic  significance.  3. Aortic Atherosclerosis (ICD10-170.0).   NON-VASCULAR   1. Enhancing 2.3 cm right adrenal mass, new since 06/14/2019,  suggesting metastatic disease or primary adrenal neoplasm.  2. Smaller left adrenal nodule, possibly adenoma.  3. Right nephrolithiasis without hydronephrosis.  4. Hyperdense upper pole right renal collecting system, progressive  since prior studies. MR may be useful to differentiate hyperdense  parapelvic cyst from neoplasm.    Electronically Signed  By: Lucrezia Europe M.D.  On: 11/09/2019  16:45  _____________________________________________________________________________________________________--  CLINICAL DATA: Pleural effusion. Right adrenal mass.   EXAM:  CT CHEST WITHOUT CONTRAST   TECHNIQUE:  Multidetector CT imaging of the chest was performed following the  standard protocol without IV contrast.   COMPARISON: 07/08/2019   FINDINGS:  Cardiovascular: Four-chamber cardiac enlargement, stable. No  pericardial effusion. Coarse coronary calcifications. Aortic  Atherosclerosis (ICD10-170.0).   Mediastinum/Nodes: No definite hilar or mediastinal adenopathy,  sensitivity decreased without IV contrast.   Lungs/Pleura: Moderate right pleural effusion, new since previous,  without convincing loculation or nodularity.. Pneumothorax seen  previously has resolved. Consolidation/atelectasis at the right lung  base with air bronchograms. Minimal linear subsegmental atelectasis  or scarring medially in the left lower lobe. Lungs otherwise clear.   Upper Abdomen: See concurrent CTA abdomen report   Musculoskeletal: Sclerosis in the manubrium presumably related to  fracture healing. Multiple healing bilateral rib fractures without  significant displacement. Sclerotic lesion in the T4 vertebral body  stable since 07/08/2019, possibly bone island but nonspecific. No  other suggestion of osseous metastatic disease. No acute fracture.   IMPRESSION:  1. Interval development of moderate right pleural effusion without  loculation or nodularity.  2. Interval resolution of right pneumothorax.  3. Healing bilateral rib and  manubrial fractures.  4. Atherosclerosis, including aortic and coronary artery disease.    Electronically Signed  By: Lucrezia Europe M.D.  On: 11/09/2019 16:56     PROCEDURES: None   ASSESSMENT:      ICD-10 Details  1 GU:   Right renal neoplasm - D49.511 Undiagnosed New Problem  3   History of bladder cancer - Z85.51 Chronic, Stable  2 NON-GU:   Right  adrenal neoplasm - D44.11 Undiagnosed New Problem   PLAN:           Schedule Return Visit/Planned Activity: ASAP - Schedule Surgery          Document Letter(s):  Created for Patient: Clinical Summary         Notes:   -The risks, benefits and alternatives of cystoscopy with RIGHT ureteroscopy, right renal pelvis biopsy, possible laser ablation of right renal pelvis tumor and ureteral stent placement was discussed the patient. Risks included, but are not limited to: bleeding, urinary tract infection, ureteral injury/avulsion, ureteral stricture formation, the possibility that multiple surgeries may be required to treat renal mass, MI, stroke, PE and the inherent risks of general anesthesia. The patient voices understanding and wishes to proceed.

## 2020-01-30 NOTE — Discharge Instructions (Signed)
CYSTOSCOPY HOME CARE INSTRUCTIONS  Activity: Rest for the remainder of the day.  Do not drive or operate equipment today.  You may resume normal activities in one to two days as instructed by your physician.   Meals: Drink plenty of liquids and eat light foods such as gelatin or soup this evening.  You may return to a normal meal plan tomorrow.  Return to Work: You may return to work in one to two days or as instructed by your physician.  Special Instructions / Symptoms: Call your physician if any of these symptoms occur:   -persistent or heavy bleeding  -bleeding which continues after first few urination  -large blood clots that are difficult to pass  -urine stream diminishes or stops completely  -fever equal to or higher than 101 degrees Farenheit.  -cloudy urine with a strong, foul odor  -severe pain  Females should always wipe from front to back after elimination.  You may feel some burning pain when you urinate.  This should disappear with time.  Applying moist heat to the lower abdomen or a hot tub bath may help relieve the pain. \  Follow-Up / Date of Return Visit to Your Physician:   Call for an appointment to arrange follow-up.    Post Anesthesia Home Care Instructions  Activity: Get plenty of rest for the remainder of the day. A responsible adult should stay with you for 24 hours following the procedure.  For the next 24 hours, DO NOT: -Drive a car -Paediatric nurse -Drink alcoholic beverages -Take any medication unless instructed by your physician -Make any legal decisions or sign important papers.  Meals: Start with liquid foods such as gelatin or soup. Progress to regular foods as tolerated. Avoid greasy, spicy, heavy foods. If nausea and/or vomiting occur, drink only clear liquids until the nausea and/or vomiting subsides. Call your physician if vomiting continues.  Special Instructions/Symptoms: Your throat may feel dry or sore from the anesthesia or the  breathing tube placed in your throat during surgery. If this causes discomfort, gargle with warm salt water. The discomfort should disappear within 24 hours.  If you had a scopolamine patch placed behind your ear for the management of post- operative nausea and/or vomiting:  1. The medication in the patch is effective for 72 hours, after which it should be removed.  Wrap patch in a tissue and discard in the trash. Wash hands thoroughly with soap and water. 2. You may remove the patch earlier than 72 hours if you experience unpleasant side effects which may include dry mouth, dizziness or visual disturbances. 3. Avoid touching the patch. Wash your hands with soap and water after contact with the patch.    Post Anesthesia Home Care Instructions  Activity: Get plenty of rest for the remainder of the day. A responsible adult should stay with you for 24 hours following the procedure.  For the next 24 hours, DO NOT: -Drive a car -Paediatric nurse -Drink alcoholic beverages -Take any medication unless instructed by your physician -Make any legal decisions or sign important papers.  Meals: Start with liquid foods such as gelatin or soup. Progress to regular foods as tolerated. Avoid greasy, spicy, heavy foods. If nausea and/or vomiting occur, drink only clear liquids until the nausea and/or vomiting subsides. Call your physician if vomiting continues.  Special Instructions/Symptoms: Your throat may feel dry or sore from the anesthesia or the breathing tube placed in your throat during surgery. If this causes discomfort, gargle with warm salt  water. The discomfort should disappear within 24 hours.  If you had a scopolamine patch placed behind your ear for the management of post- operative nausea and/or vomiting:  1. The medication in the patch is effective for 72 hours, after which it should be removed.  Wrap patch in a tissue and discard in the trash. Wash hands thoroughly with soap and  water. 2. You may remove the patch earlier than 72 hours if you experience unpleasant side effects which may include dry mouth, dizziness or visual disturbances. 3. Avoid touching the patch. Wash your hands with soap and water after contact with the patch.

## 2020-01-30 NOTE — Transfer of Care (Signed)
Immediate Anesthesia Transfer of Care Note  Patient: Kayla Bowers  Procedure(s) Performed: Procedure(s): CYSTOSCOPY WITH RIGHT URETEROSCOPY/POSSIBLE BIOPSY/ POSSIBLE LASER ABLATION/ POSSIBLE STENT PLACEMENT (Right)  Patient Location: PACU  Anesthesia Type:General  Level of Consciousness: Alert, Awake, Oriented  Airway & Oxygen Therapy: Patient Spontanous Breathing  Post-op Assessment: Report given to RN  Post vital signs: Reviewed and stable  Last Vitals:  Vitals:   01/30/20 0720  BP: (!) 163/75  Pulse: (!) 57  Resp: 14  Temp: 36.6 C  SpO2: [id number]    Complications: No apparent anesthesia complications

## 2020-01-30 NOTE — Op Note (Signed)
Operative Note  Preoperative diagnosis:  1.  2 cm right renal pelvis mass  Postoperative diagnosis: 1.  2 cm right renal pelvis mass  Procedure(s): 1.  Cystoscopy with right ureteroscopy, right renal mass biopsy 2.  Right retrograde pyelogram with intraoperative interpretation of fluoroscopic imaging  Surgeon: Ellison Hughs, MD  Assistants:  None  Anesthesia:  General  Complications:  None  EBL: 5 mL  Specimens: 1.  Right renal pelvis washings 2.  Right renal pelvis mass biopsy 3.  Brush biopsy of right renal pelvis mass  Drains/Catheters: 1.  None  Intraoperative findings:   1. 2 cm right upper pole renal pelvis mass with features concerning for urothelial carcinoma 2. Right retrograde pyelogram revealed a tortuous right ureter with a filling defect within the upper pole of the right collecting system, consistent with the enhancing mass seen on recent CT.  No other intravesical, intraureteral or right renal pelvis lesions were noted.  Indication:  Kayla Bowers is a 86 y.o. female with an enhancing 2 cm right renal pelvis mass along with a 2 cm solid right adrenal nodule seen on recent CT and MRI.  She has been consented for the above procedures, voices understanding and wishes to proceed.  Description of procedure:  After informed consent was obtained, the patient was brought to the operating room and general LMA anesthesia was administered. The patient was then placed in the dorsolithotomy position and prepped and draped in the usual sterile fashion. A timeout was performed. A 23 French rigid cystoscope was then inserted into the urethral meatus and advanced into the bladder under direct vision. A complete bladder survey revealed no intravesical pathology.  A 5 French ureteral catheter was then inserted into the right ureteral orifice and a retrograde pyelogram was obtained, with the findings listed above.  A Glidewire was then used to intubate the lumen of the  ureteral catheter and was advanced up to the right renal pelvis, under fluoroscopic guidance.  The catheter was then removed, leaving the wire in place.  A flexible ureteroscope was then advanced alongside the wire and up the right ureter.  A full inspection of the right renal pelvis revealed the 2 cm mass noted on recent cross-sectional imaging.  Right renal pelvis washings were obtained along with a renal pelvis biopsy as well as a brush biopsy of the mass in question.  Given the massive size and risk for bleeding, it was not ablated.  The flexible ureteroscope was then removed under direct vision.  There were no ureteral lesions noted on flexible cystoscopy.  Given the atraumatic nature of the ureteroscopy, a stent was not placed.  The patient's bladder was drained.  She tolerated the procedure well and was transferred to the postanesthesia in stable condition.  Plan: Follow-up in 2 weeks to discuss pathology results

## 2020-01-31 LAB — SURGICAL PATHOLOGY

## 2020-01-31 LAB — CYTOLOGY - NON PAP

## 2020-02-15 DIAGNOSIS — N3020 Other chronic cystitis without hematuria: Secondary | ICD-10-CM | POA: Diagnosis not present

## 2020-02-15 DIAGNOSIS — R8279 Other abnormal findings on microbiological examination of urine: Secondary | ICD-10-CM | POA: Diagnosis not present

## 2020-02-15 DIAGNOSIS — C651 Malignant neoplasm of right renal pelvis: Secondary | ICD-10-CM | POA: Diagnosis not present

## 2020-02-25 DIAGNOSIS — I700 Atherosclerosis of aorta: Secondary | ICD-10-CM | POA: Diagnosis not present

## 2020-02-25 DIAGNOSIS — N183 Chronic kidney disease, stage 3 unspecified: Secondary | ICD-10-CM | POA: Diagnosis not present

## 2020-02-25 DIAGNOSIS — I129 Hypertensive chronic kidney disease with stage 1 through stage 4 chronic kidney disease, or unspecified chronic kidney disease: Secondary | ICD-10-CM | POA: Diagnosis not present

## 2020-03-03 ENCOUNTER — Other Ambulatory Visit: Payer: Self-pay | Admitting: Urology

## 2020-03-26 DIAGNOSIS — I129 Hypertensive chronic kidney disease with stage 1 through stage 4 chronic kidney disease, or unspecified chronic kidney disease: Secondary | ICD-10-CM | POA: Diagnosis not present

## 2020-03-26 DIAGNOSIS — I700 Atherosclerosis of aorta: Secondary | ICD-10-CM | POA: Diagnosis not present

## 2020-03-26 DIAGNOSIS — N183 Chronic kidney disease, stage 3 unspecified: Secondary | ICD-10-CM | POA: Diagnosis not present

## 2020-04-04 DIAGNOSIS — E782 Mixed hyperlipidemia: Secondary | ICD-10-CM | POA: Diagnosis not present

## 2020-04-10 NOTE — Patient Instructions (Addendum)
DUE TO COVID-19 ONLY ONE VISITOR IS ALLOWED TO COME WITH YOU AND STAY IN THE WAITING ROOM ONLY DURING PRE OP AND PROCEDURE. THEN TWO VISITORS MAY VISIT WITH  YOU IN YOUR PRIVATE ROOM DURING VISITING HOURS ONLY!! (10AM-8PM)   COVID SWAB TESTING MUST BE COMPLETED ON:  04-19-20@ 10:30 AM   [address], [place] -[facility] enter pre surgical testing line (Must self quarantine after testing. Follow instructions on handout.)        [facility] [place] (Must self quarantine after testing. Follow instructions on handout.)       Your procedure is scheduled on: 04-23-20   Report to [facility] Main  Entrance    Report to admitting at 10:30 AM   Call this number if you have problems the morning of surgery [phone]   Do not eat food or drink liquids :After Midnight.    Take these medicines the morning of surgery with A SIP OF WATER:  None  Oral Hygiene is also important to reduce your risk of infection.                                     Remember - BRUSH YOUR TEETH THE MORNING OF SURGERY WITH YOUR REGULAR TOOTHPASTE.  AFTERWARDS NO WATER, GUM, CANDY OR MINTS               You may not have any metal on your body including hair pins, jewelry, and body piercings              Do not wear make-up, lotions, powders, perfumes or deodorant              Do not wear nail polish.  Do not shave  48 hours prior to surgery.               Do not bring valuables to the hospital. [place].   Contacts, dentures or bridgework may not be worn into surgery.   Bring small overnight bag day of surgery.    Special Instructions:               Please read over the following fact sheets you were given: IF YOU HAVE QUESTIONS ABOUT YOUR PRE OP INSTRUCTIONS PLEASE CALL [phone]   [facility] - Preparing for Surgery Before surgery, you can play an important role.  Because skin  is not sterile, your skin needs to be as free of germs as possible.  You can reduce the number of germs on your skin by washing with CHG (chlorahexidine gluconate) soap before surgery.  CHG is an antiseptic cleaner which kills germs and bonds with the skin to continue killing germs even after washing. Please DO NOT use if you have an allergy to CHG or antibacterial soaps.  If your skin becomes reddened/irritated stop using the CHG and inform your nurse when you arrive at Short Stay. Do not shave (including legs and underarms) for at least 48 hours prior to the first CHG shower.  You may shave your face/neck.  Please follow these instructions carefully:  1.  Shower with CHG Soap the night before surgery and the  morning of surgery.  2.  If you choose to wash your hair, wash your hair first as usual with your normal  shampoo.  3.  After you shampoo, rinse your hair and body thoroughly to remove the shampoo.                             4.  Use CHG as you would any other liquid soap.  You can apply chg directly to the skin and wash.  Gently with a scrungie or clean washcloth.  5.  Apply the CHG Soap to your body ONLY FROM THE NECK DOWN.   Do   not use on face/ open                           Wound or open sores. Avoid contact with eyes, ears mouth and   genitals (private parts).                       Wash face,  Genitals (private parts) with your normal soap.             6.  Wash thoroughly, paying special attention to the area where your    surgery  will be performed.  7.  Thoroughly rinse your body with warm water from the neck down.  8.  DO NOT shower/wash with your normal soap after using and rinsing off the CHG Soap.                9.  Pat yourself dry with a clean towel.            10.  Wear clean pajamas.            11.  Place clean sheets on your bed the night of your first shower and do not  sleep with pets. Day of Surgery : Do not apply any lotions/deodorants the morning of surgery.  Please  wear clean clothes to the hospital/surgery center.  FAILURE TO FOLLOW THESE INSTRUCTIONS MAY RESULT IN THE CANCELLATION OF YOUR SURGERY  PATIENT SIGNATURE_________________________________  NURSE SIGNATURE__________________________________  ________________________________________________________________________

## 2020-04-10 NOTE — Progress Notes (Addendum)
COVID Vaccine Completed: Date COVID Vaccine completed: COVID vaccine manufacturer: [place]   PCP - [name] Cardiologist -  No back stimulator   Chest x-ray -  EKG - 01-30-20 Stress Test -  ECHO -  Cardiac Cath -   Sleep Study -  CPAP -   Fasting Blood Sugar -  Checks Blood Sugar _____ times a day  Blood Thinner Instructions: Aspirin Instructions: Last Dose:  No SOB w/ADL's   Anesthesia review:   Patient denies shortness of breath, fever, cough and chest pain at PAT appointment   Patient verbalized understanding of instructions that were given to them at the PAT appointment. Patient was also instructed that they will need to review over the PAT instructions again at home before surgery.

## 2020-04-11 DIAGNOSIS — N1831 Chronic kidney disease, stage 3a: Secondary | ICD-10-CM | POA: Diagnosis not present

## 2020-04-11 DIAGNOSIS — C799 Secondary malignant neoplasm of unspecified site: Secondary | ICD-10-CM | POA: Diagnosis not present

## 2020-04-11 DIAGNOSIS — C679 Malignant neoplasm of bladder, unspecified: Secondary | ICD-10-CM | POA: Diagnosis not present

## 2020-04-11 DIAGNOSIS — I700 Atherosclerosis of aorta: Secondary | ICD-10-CM | POA: Diagnosis not present

## 2020-04-11 DIAGNOSIS — Z139 Encounter for screening, unspecified: Secondary | ICD-10-CM | POA: Diagnosis not present

## 2020-04-14 ENCOUNTER — Encounter (HOSPITAL_COMMUNITY)
Admission: RE | Admit: 2020-04-14 | Discharge: 2020-04-14 | Disposition: A | Discharge status: Home / Self Care | Payer: Medicare PPO | Source: Ambulatory Visit | Attending: Urology | Admitting: Urology

## 2020-04-14 ENCOUNTER — Other Ambulatory Visit: Payer: Self-pay

## 2020-04-14 ENCOUNTER — Encounter (HOSPITAL_COMMUNITY): Payer: Self-pay

## 2020-04-14 DIAGNOSIS — Z01812 Encounter for preprocedural laboratory examination: Secondary | ICD-10-CM | POA: Insufficient documentation

## 2020-04-14 LAB — BASIC METABOLIC PANEL
Anion gap: 10 (ref 5–15)
BUN: 21 mg/dL (ref 8–23)
CO2: 29 mmol/L (ref 22–32)
Calcium: 10.7 mg/dL — ABNORMAL HIGH (ref 8.9–10.3)
Chloride: 99 mmol/L (ref 98–111)
Creatinine, Ser: 0.95 mg/dL (ref 0.44–1.00)
GFR calc Af Amer: >60 mL/min (ref >60)
GFR calc non Af Amer: 53 mL/min — ABNORMAL LOW (ref >60)
Glucose, Bld: 107 mg/dL — ABNORMAL HIGH (ref 70–99)
Potassium: 5.3 mmol/L — ABNORMAL HIGH (ref 3.5–5.1)
Sodium: 138 mmol/L (ref 135–145)

## 2020-04-14 LAB — CBC
HCT: 44 % (ref 36.0–46.0)
Hemoglobin: 13.8 g/dL (ref 12.0–15.0)
MCH: 28.9 pg (ref 26.0–34.0)
MCHC: 31.4 g/dL (ref 30.0–36.0)
MCV: 92.1 fL (ref 80.0–100.0)
Platelets: 314 10*3/uL (ref 150–400)
RBC: 4.78 MIL/uL (ref 3.87–5.11)
RDW: 12.9 % (ref 11.5–15.5)
WBC: 6.9 10*3/uL (ref 4.0–10.5)
nRBC: 0 % (ref 0.0–0.2)

## 2020-04-18 NOTE — Progress Notes (Signed)
Anesthesia Chart Review   Case: [id number] Date/Time: 04/23/20 1200   Procedures:      XI ROBOT ASSITED LAPAROSCOPIC NEPHROURETERECTOMY (Right ) - ONLY NEEDS 240 MIN FOR BOTH PROCEDURES     XI ROBOTIC ADRENALECTOMY (Right )   Anesthesia type: General   Pre-op diagnosis: UROTHELIAL CARCINOMA OF THE RIGHT RENAL PELVIS   Location: [facility] ROOM 03 / [facility]   Surgeons: Ceasar Mons, MD      DISCUSSION:86 y.o. former smoker with h/o HTN, urothelial carcinoma of right renal pelvis scheduled for above procedure 04/23/2020 with Dr. Harrell Gave Lovena Neighbours.   EKG 01/30/20 with LBBB.  No EKGs in Epic to compare to.  PCP contacted, no EKG tracings on file.  In light of new onset LBBB pt will need cardiac evaluation prior to proceeding.  Discussed with Dr. Marcie Bal who agrees with plan.  Dr. Jackson Latino scheduler informed.  VS: BP (!) 161/58   Pulse 90   Temp 36.8 C (Oral)   Resp 16   Ht 5' 5.5" (1.664 m)   Wt 63 kg   SpO2 100%   BMI 22.78 kg/m   PROVIDERS: [name], MD is PCP    LABS: Labs reviewed: Acceptable for surgery. (all labs ordered are listed, but only abnormal results are displayed)  Labs Reviewed  BASIC METABOLIC PANEL - Abnormal; Notable for the following components:      Result Value   Potassium 5.3 (*)    Glucose, Bld 107 (*)    Calcium 10.7 (*)    GFR calc non Af Amer 53 (*)    All other components within normal limits  CBC     IMAGES:   EKG: 01/30/2020 Rate 63 bpm  Sinus rhythm with 1st degree A-V block Possible Left atrial enlargement Left axis deviation Left bundle branch block Abnormal ECG  CV:  Past Medical History:  Diagnosis Date  . Bladder cancer ([place])   . History of kidney stones   . Hypercholesteremia   . Hypertension   . Kidney stone     Past Surgical History:  Procedure Laterality Date  . ABDOMINAL HYSTERECTOMY    . APPENDECTOMY    . BLADDER SURGERY  01/2017   removal of a tumor  . CATARACT EXTRACTION    . COLON SURGERY     took  out 12 inches  . COLONOSCOPY  11/19/2009   Mild pancolonic diverticulitis. Status post sigmoid resection. Small internal hemorrhoid. Otherwies normal colonsocopy.   . CYSTOSCOPY WITH URETEROSCOPY AND STENT PLACEMENT Right 01/30/2020   Procedure: CYSTOSCOPY WITH RIGHT URETEROSCOPY/BIOPSY;  Surgeon: Ceasar Mons, MD;  Location: [facility];  Service: Urology;  Laterality: Right;  . EYE SURGERY Bilateral 1990   Implant  . Tummy Tuck    . Vericose Vein Stripping      MEDICATIONS: . fenofibrate 160 MG tablet  . FENOFIBRATE PO  . hydrochlorothiazide (MICROZIDE) 12.5 MG capsule  . lactobacillus acidophilus (BACID) TABS tablet  . Multiple Vitamins-Minerals (MULTIVITAMIN ADULT PO)  . Multiple Vitamins-Minerals (PRESERVISION AREDS PO)  . vancomycin (VANCOCIN) 125 MG capsule   No current facility-administered medications for this encounter.     Konrad Felix, PA-C [facility] Pre-Surgical Testing [phone]

## 2020-04-19 ENCOUNTER — Other Ambulatory Visit (HOSPITAL_COMMUNITY)
Admission: RE | Admit: 2020-04-19 | Discharge: 2020-04-19 | Disposition: A | Discharge status: Home / Self Care | Payer: Medicare PPO | Source: Ambulatory Visit | Attending: Urology | Admitting: Urology

## 2020-04-19 DIAGNOSIS — Z01812 Encounter for preprocedural laboratory examination: Secondary | ICD-10-CM | POA: Diagnosis not present

## 2020-04-19 DIAGNOSIS — Z20822 Contact with and (suspected) exposure to covid-19: Secondary | ICD-10-CM | POA: Insufficient documentation

## 2020-04-19 LAB — SARS CORONAVIRUS 2 (TAT 6-24 HRS): SARS Coronavirus 2: NEGATIVE

## 2020-04-27 DIAGNOSIS — N1831 Chronic kidney disease, stage 3a: Secondary | ICD-10-CM | POA: Diagnosis not present

## 2020-04-27 DIAGNOSIS — D692 Other nonthrombocytopenic purpura: Secondary | ICD-10-CM | POA: Diagnosis not present

## 2020-04-27 DIAGNOSIS — C799 Secondary malignant neoplasm of unspecified site: Secondary | ICD-10-CM | POA: Diagnosis not present

## 2020-04-27 DIAGNOSIS — I700 Atherosclerosis of aorta: Secondary | ICD-10-CM | POA: Diagnosis not present

## 2020-04-30 ENCOUNTER — Encounter: Payer: Self-pay | Admitting: Cardiology

## 2020-04-30 ENCOUNTER — Other Ambulatory Visit: Payer: Self-pay

## 2020-04-30 ENCOUNTER — Ambulatory Visit: Payer: Medicare PPO | Admitting: Cardiology

## 2020-04-30 VITALS — BP 148/58 | HR 69 | Ht 65.5 in | Wt 140.6 lb

## 2020-04-30 DIAGNOSIS — Z01810 Encounter for preprocedural cardiovascular examination: Secondary | ICD-10-CM | POA: Diagnosis not present

## 2020-04-30 DIAGNOSIS — E781 Pure hyperglyceridemia: Secondary | ICD-10-CM

## 2020-04-30 DIAGNOSIS — I517 Cardiomegaly: Secondary | ICD-10-CM

## 2020-04-30 DIAGNOSIS — I447 Left bundle-branch block, unspecified: Secondary | ICD-10-CM

## 2020-04-30 DIAGNOSIS — I10 Essential (primary) hypertension: Secondary | ICD-10-CM

## 2020-04-30 HISTORY — DX: Left bundle-branch block, unspecified: I44.7

## 2020-04-30 HISTORY — DX: Cardiomegaly: I51.7

## 2020-04-30 HISTORY — DX: Pure hyperglyceridemia: E78.1

## 2020-04-30 HISTORY — DX: Essential (primary) hypertension: I10

## 2020-04-30 NOTE — Patient Instructions (Signed)
Medication Instructions:  Your physician recommends that you continue on your current medications as directed. Please refer to the Current Medication list given to you today.  *If you need a refill on your cardiac medications before your next appointment, please call your pharmacy*   Lab Work: None ordered   If you have labs (blood work) drawn today and your tests are completely normal, you will receive your results only by: . MyChart Message (if you have MyChart) OR . A paper copy in the mail If you have any lab test that is abnormal or we need to change your treatment, we will call you to review the results.   Testing/Procedures: Your physician has requested that you have an echocardiogram. Echocardiography is a painless test that uses sound waves to create images of your heart. It provides your doctor with information about the size and shape of your heart and how well your heart's chambers and valves are working. This procedure takes approximately one hour. There are no restrictions for this procedure.     Follow-Up: At [facility], you and your health needs are our priority.  As part of our continuing mission to provide you with exceptional heart care, we have created designated Provider Care Teams.  These Care Teams include your primary Cardiologist (physician) and Advanced Practice Providers (APPs -  Physician Assistants and Nurse Practitioners) who all work together to provide you with the care you need, when you need it.  We recommend signing up for the patient portal called "MyChart".  Sign up information is provided on this After Visit Summary.  MyChart is used to connect with patients for Virtual Visits (Telemedicine).  Patients are able to view lab/test results, encounter notes, upcoming appointments, etc.  Non-urgent messages can be sent to your provider as well.   To learn more about what you can do with MyChart, go to [email or web address].    Your next appointment:    1 month(s)  The format for your next appointment:   In Person  Provider:   Kardie Tobb, DO   Other Instructions None   

## 2020-04-30 NOTE — Progress Notes (Signed)
Cardiology Office Note:    Date:  04/30/2020   ID:  Kayla Bowers, DOB [birth year], MRN [id number]  PCP:  [name], MD  Cardiologist:  Berniece Salines, DO  Electrophysiologist:  None   Referring MD: Davis Gourd*   " I am doing fine"  History of Present Illness:    Kayla Bowers is a 86 y.o. female with a hx of hypertension, hypercholesteremia presents today to be evaluated preoperatively for Left bundle branch block seen on her ekg.  The patient is here today with her husband.  She denies any chest pain, shortness of breath, nausea, vomiting or any dizziness or lightheadedness.  She tells me that she is doing all her activities at home without any problems.  Past Medical History:  Diagnosis Date  . Bladder cancer ([place])   . Closed wedge compression fracture of T2 vertebra ([place]) 07/09/2019  . History of kidney stones   . Hypercholesteremia   . Hypertension   . Kidney stone     Past Surgical History:  Procedure Laterality Date  . ABDOMINAL HYSTERECTOMY    . APPENDECTOMY    . BLADDER SURGERY  01/2017   removal of a tumor  . CATARACT EXTRACTION    . COLON SURGERY     took out 12 inches  . COLONOSCOPY  11/19/2009   Mild pancolonic diverticulitis. Status post sigmoid resection. Small internal hemorrhoid. Otherwies normal colonsocopy.   . CYSTOSCOPY WITH URETEROSCOPY AND STENT PLACEMENT Right 01/30/2020   Procedure: CYSTOSCOPY WITH RIGHT URETEROSCOPY/BIOPSY;  Surgeon: Ceasar Mons, MD;  Location: [facility];  Service: Urology;  Laterality: Right;  . EYE SURGERY Bilateral 1990   Implant  . Tummy Tuck    . Vericose Vein Stripping      Current Medications: Current Meds  Medication Sig  . fenofibrate 160 MG tablet Take 160 mg by mouth at bedtime.  . hydrochlorothiazide (MICROZIDE) 12.5 MG capsule Take 12.5 mg by mouth daily.   . Multiple Vitamins-Minerals (MULTIVITAMIN ADULT PO) Take 1 tablet by mouth daily.   . Multiple  Vitamins-Minerals (PRESERVISION AREDS PO) Take 1 tablet by mouth daily.      Allergies:   Flomax [tamsulosin hcl]   Social History   Socioeconomic History  . Marital status: Married    Spouse name: Not on file  . Number of children: 2  . Years of education: Not on file  . Highest education level: Not on file  Occupational History  . Not on file  Tobacco Use  . Smoking status: Former Research scientist (life sciences)  . Smokeless tobacco: Never Used  Vaping Use  . Vaping Use: Never used  Substance and Sexual Activity  . Alcohol use: Not Currently    Comment: quit 2 years ago   . Drug use: Never  . Sexual activity: Not Currently    Birth control/protection: Post-menopausal  Other Topics Concern  . Not on file  Social History Narrative  . Not on file   Social Determinants of Health   Financial Resource Strain:   . Difficulty of Paying Living Expenses:   Food Insecurity:   . Worried About Charity fundraiser in the Last Year:   . Arboriculturist in the Last Year:   Transportation Needs:   . Film/video editor (Medical):   Marland Kitchen Lack of Transportation (Non-Medical):   Physical Activity:   . Days of Exercise per Week:   . Minutes of Exercise per Session:   Stress:   . Feeling of  Stress :   Social Connections:   . Frequency of Communication with Friends and Family:   . Frequency of Social Gatherings with Friends and Family:   . Attends Religious Services:   . Active Member of Clubs or Organizations:   . Attends Archivist Meetings:   Marland Kitchen Marital Status:      Family History: The patient's family history is negative for Colon cancer and Esophageal cancer.  ROS:   Review of Systems  Constitution: Negative for decreased appetite, fever and weight gain.  HENT: Negative for congestion, ear discharge, hoarse voice and sore throat.   Eyes: Negative for discharge, redness, vision loss in right eye and visual halos.  Cardiovascular: Negative for chest pain, dyspnea on exertion, leg  swelling, orthopnea and palpitations.  Respiratory: Negative for cough, hemoptysis, shortness of breath and snoring.   Endocrine: Negative for heat intolerance and polyphagia.  Hematologic/Lymphatic: Negative for bleeding problem. Does not bruise/bleed easily.  Skin: Negative for flushing, nail changes, rash and suspicious lesions.  Musculoskeletal: Negative for arthritis, joint pain, muscle cramps, myalgias, neck pain and stiffness.  Gastrointestinal: Negative for abdominal pain, bowel incontinence, diarrhea and excessive appetite.  Genitourinary: Negative for decreased libido, genital sores and incomplete emptying.  Neurological: Negative for brief paralysis, focal weakness, headaches and loss of balance.  Psychiatric/Behavioral: Negative for altered mental status, depression and suicidal ideas.  Allergic/Immunologic: Negative for HIV exposure and persistent infections.    EKGs/Labs/Other Studies Reviewed:    The following studies were reviewed today:   EKG:  The ekg ordered today demonstrates sinus rhythm, prolonged PR interval, left anterior fascicular block with underlying left bundle branch block.  Poor R wave progression in the precordial leads cannot rule out septal infarction.  Nonspecific T wave abnormalities in P wave morphology suggesting left atrial enlargement.  Compared to prior EKG done on Jan 30, 2020 no significant change.  Recent Labs: 04/14/2020: BUN 21; Creatinine, Ser 0.95; Hemoglobin 13.8; Platelets 314; Potassium 5.3; Sodium 138  Recent Lipid Panel No results found for: CHOL, TRIG, HDL, CHOLHDL, VLDL, LDLCALC, LDLDIRECT  Physical Exam:    VS:  BP (!) 148/58   Pulse 69   Ht 5' 5.5" (1.664 m)   Wt 140 lb 9.6 oz (63.8 kg)   SpO2 99%   BMI 23.04 kg/m     Wt Readings from Last 3 Encounters:  04/30/20 140 lb 9.6 oz (63.8 kg)  04/14/20 139 lb (63 kg)  01/30/20 137 lb 1.6 oz (62.2 kg)     GEN: Well nourished, well developed in no acute distress HEENT:  Normal NECK: No JVD; No carotid bruits LYMPHATICS: No lymphadenopathy CARDIAC: S1S2 noted,RRR, no murmurs, rubs, gallops RESPIRATORY:  Clear to auscultation without rales, wheezing or rhonchi  ABDOMEN: Soft, non-tender, non-distended, +bowel sounds, no guarding. EXTREMITIES: No edema, No cyanosis, no clubbing MUSCULOSKELETAL:  No deformity  SKIN: Warm and dry NEUROLOGIC:  Alert and oriented x 3, non-focal PSYCHIATRIC:  Normal affect, good insight  ASSESSMENT:    1. Preoperative cardiovascular examination   2. Left bundle branch block   3. Essential hypertension   4. Left atrial enlargement   5. Hypertriglyceridemia    PLAN:     The patient does not have any unstable cardiac conditions.  Upon evaluation today, she can achieve 4 METs or greater without anginal symptoms.  According to [facility] and AHA guidelines, she requires no further cardiac workup prior to her noncardiac surgery and should be at acceptable risk.  Our service is available as  necessary in the perioperative period, at which time please contact heart care.  Due to concern for left atrial enlargement on her EKG I am getting an echocardiogram to rule out any other structural abnormalities.  This testing is not a criteria to clear the patient for her surgery therefore as stated above she can proceed with her noncardiac surgery.  Hypertension her blood pressure is acceptable in the office today no changes will be made to her antihypertensive regimen.  Hypertriglyceridemia continue patient on her fenofibrate.  The patient is in agreement with the above plan. The patient left the office in stable condition.  The patient will follow up in 1 month.    Medication Adjustments/Labs and Tests Ordered: Current medicines are reviewed at length with the patient today.  Concerns regarding medicines are outlined above.  Orders Placed This Encounter  Procedures  . EKG 12-Lead  . ECHOCARDIOGRAM COMPLETE   No orders of the defined  types were placed in this encounter.   Patient Instructions  Medication Instructions:  Your physician recommends that you continue on your current medications as directed. Please refer to the Current Medication list given to you today.  *If you need a refill on your cardiac medications before your next appointment, please call your pharmacy*   Lab Work: None ordered   If you have labs (blood work) drawn today and your tests are completely normal, you will receive your results only by: Marland Kitchen MyChart Message (if you have MyChart) OR . A paper copy in the mail If you have any lab test that is abnormal or we need to change your treatment, we will call you to review the results.   Testing/Procedures: Your physician has requested that you have an echocardiogram. Echocardiography is a painless test that uses sound waves to create images of your heart. It provides your doctor with information about the size and shape of your heart and how well your heart's chambers and valves are working. This procedure takes approximately one hour. There are no restrictions for this procedure.     Follow-Up: At [facility], you and your health needs are our priority.  As part of our continuing mission to provide you with exceptional heart care, we have created designated Provider Care Teams.  These Care Teams include your primary Cardiologist (physician) and Advanced Practice Providers (APPs -  Physician Assistants and Nurse Practitioners) who all work together to provide you with the care you need, when you need it.  We recommend signing up for the patient portal called "MyChart".  Sign up information is provided on this After Visit Summary.  MyChart is used to connect with patients for Virtual Visits (Telemedicine).  Patients are able to view lab/test results, encounter notes, upcoming appointments, etc.  Non-urgent messages can be sent to your provider as well.   To learn more about what you can do with  MyChart, go to [email or web address].    Your next appointment:   1 month(s)  The format for your next appointment:   In Person  Provider:   Berniece Salines, DO   Other Instructions None      Adopting a Healthy Lifestyle.  Know what a healthy weight is for you (roughly BMI <25) and aim to maintain this   Aim for 7+ servings of fruits and vegetables daily   65-80+ fluid ounces of water or unsweet tea for healthy kidneys   Limit to max 1 drink of alcohol per day; avoid smoking/tobacco   Limit animal fats in diet  for cholesterol and heart health - choose grass fed whenever available   Avoid highly processed foods, and foods high in saturated/trans fats   Aim for low stress - take time to unwind and care for your mental health   Aim for 150 min of moderate intensity exercise weekly for heart health, and weights twice weekly for bone health   Aim for 7-9 hours of sleep daily   When it comes to diets, agreement about the perfect plan isnt easy to find, even among the experts. Experts at the [employer] developed an idea known as the Healthy Eating Plate. Just imagine a plate divided into logical, healthy portions.   The emphasis is on diet quality:   Load up on vegetables and fruits - one-half of your plate: Aim for color and variety, and remember that potatoes dont count.   Go for whole grains - one-quarter of your plate: Whole wheat, barley, wheat berries, quinoa, oats, brown rice, and foods made with them. If you want pasta, go with whole wheat pasta.   Protein power - one-quarter of your plate: Fish, chicken, beans, and nuts are all healthy, versatile protein sources. Limit red meat.   The diet, however, does go beyond the plate, offering a few other suggestions.   Use healthy plant oils, such as olive, canola, soy, corn, sunflower and peanut. Check the labels, and avoid partially hydrogenated oil, which have unhealthy trans fats.   If youre  thirsty, drink water. Coffee and tea are good in moderation, but skip sugary drinks and limit milk and dairy products to one or two daily servings.   The type of carbohydrate in the diet is more important than the amount. Some sources of carbohydrates, such as vegetables, fruits, whole grains, and beans-are healthier than others.   Finally, stay active  Signed, Berniece Salines, DO  04/30/2020 2:15 PM    [facility]

## 2020-05-08 NOTE — Patient Instructions (Addendum)
DUE TO COVID-19 ONLY ONE VISITOR IS ALLOWED TO COME WITH YOU AND STAY IN THE WAITING ROOM ONLY DURING PRE OP AND PROCEDURE DAY OF SURGERY. THE 1 VISITOR  MAY VISIT WITH YOU AFTER SURGERY IN YOUR PRIVATE ROOM DURING VISITING HOURS ONLY!  YOU NEED TO HAVE A COVID 19 TEST ON: 05/17/20 @ 10:30 am, THIS TEST MUST BE DONE BEFORE SURGERY,  COVID TESTING SITE [place] [id number], IT IS ON THE RIGHT GOING OUT [address] APPROXIMATELY  2 MINUTES PAST [employer] ON THE RIGHT. ONCE YOUR COVID TEST IS COMPLETED,  PLEASE BEGIN THE QUARANTINE INSTRUCTIONS AS OUTLINED IN YOUR HANDOUT.                Kayla Bowers    Your procedure is scheduled on: 05/21/20   Report to [facility] Main  Entrance   Report to admitting at: 10:30 AM     Call this number if you have problems the morning of surgery [phone]    Remember: Do not eat solid food: After Midnight. Clear liquids from midnight until: 9:30 am.  CLEAR LIQUID DIET   Foods Allowed                                                                     Foods Excluded  Coffee and tea, regular and decaf                             liquids that you cannot  Plain Jell-O any favor except red or purple                                           see through such as: Fruit ices (not with fruit pulp)                                     milk, soups, orange juice  Iced Popsicles                                    All solid food Carbonated beverages, regular and diet                                    Cranberry, grape and apple juices Sports drinks like Gatorade Lightly seasoned clear broth or consume(fat free) Sugar, honey syrup  Sample Menu Breakfast                                Lunch                                     Supper Cranberry juice  Beef broth                            Chicken broth Jell-O                                     Grape juice                           Apple  juice Coffee or tea                        Jell-O                                      Popsicle                                                Coffee or tea                        Coffee or tea  _____________________________________________________________________  BRUSH YOUR TEETH MORNING OF SURGERY AND RINSE YOUR MOUTH OUT, NO CHEWING GUM CANDY OR MINTS.                                 You may not have any metal on your body including hair pins and              piercings  Do not wear jewelry, make-up, lotions, powders or perfumes, deodorant             Do not wear nail polish on your fingernails.  Do not shave  48 hours prior to surgery.               Do not bring valuables to the hospital. [place].  Contacts, dentures or bridgework may not be worn into surgery.  Leave suitcase in the car. After surgery it may be brought to your room.     Patients discharged the day of surgery will not be allowed to drive home. IF YOU ARE HAVING SURGERY AND GOING HOME THE SAME DAY, YOU MUST HAVE AN ADULT TO DRIVE YOU HOME AND BE WITH YOU FOR 24 HOURS. YOU MAY GO HOME BY TAXI OR UBER OR ORTHERWISE, BUT AN ADULT MUST ACCOMPANY YOU HOME AND STAY WITH YOU FOR 24 HOURS.  Name and phone number of your driver:  Special Instructions: N/A              Please read over the following fact sheets you were given: _____________________________________________________________________       [facility] - Preparing for Surgery Before surgery, you can play an important role.  Because skin is not sterile, your skin needs to be as free of germs as possible.  You can reduce the number of germs on your skin by washing with CHG (chlorahexidine gluconate) soap before surgery.  CHG is an antiseptic cleaner which kills germs and bonds  with the skin to continue killing germs even after washing. Please DO NOT use if you have an allergy to CHG or antibacterial soaps.  If your skin  becomes reddened/irritated stop using the CHG and inform your nurse when you arrive at Short Stay. Do not shave (including legs and underarms) for at least 48 hours prior to the first CHG shower.  You may shave your face/neck. Please follow these instructions carefully:  1.  Shower with CHG Soap the night before surgery and the  morning of Surgery.  2.  If you choose to wash your hair, wash your hair first as usual with your  normal  shampoo.  3.  After you shampoo, rinse your hair and body thoroughly to remove the  shampoo.                           4.  Use CHG as you would any other liquid soap.  You can apply chg directly  to the skin and wash                       Gently with a scrungie or clean washcloth.  5.  Apply the CHG Soap to your body ONLY FROM THE NECK DOWN.   Do not use on face/ open                           Wound or open sores. Avoid contact with eyes, ears mouth and genitals (private parts).                       Wash face,  Genitals (private parts) with your normal soap.             6.  Wash thoroughly, paying special attention to the area where your surgery  will be performed.  7.  Thoroughly rinse your body with warm water from the neck down.  8.  DO NOT shower/wash with your normal soap after using and rinsing off  the CHG Soap.                9.  Pat yourself dry with a clean towel.            10.  Wear clean pajamas.            11.  Place clean sheets on your bed the night of your first shower and do not  sleep with pets. Day of Surgery : Do not apply any lotions/deodorants the morning of surgery.  Please wear clean clothes to the hospital/surgery center.  FAILURE TO FOLLOW THESE INSTRUCTIONS MAY RESULT IN THE CANCELLATION OF YOUR SURGERY PATIENT SIGNATURE_________________________________  NURSE SIGNATURE__________________________________  ________________________________________________________________________ [facility] - Preparing for Surgery Before surgery, you  can play an important role.  Because skin is not sterile, your skin needs to be as free of germs as possible.  You can reduce the number of germs on your skin by washing with CHG (chlorahexidine gluconate) soap before surgery.  CHG is an antiseptic cleaner which kills germs and bonds with the skin to continue killing germs even after washing. Please DO NOT use if you have an allergy to CHG or antibacterial soaps.  If your skin becomes reddened/irritated stop using the CHG and inform your nurse when you arrive at Short Stay. Do not shave (including legs and underarms) for at least 48 hours prior  to the first CHG shower.  You may shave your face/neck. Please follow these instructions carefully:  1.  Shower with CHG Soap the night before surgery and the  morning of Surgery.  2.  If you choose to wash your hair, wash your hair first as usual with your  normal  shampoo.  3.  After you shampoo, rinse your hair and body thoroughly to remove the  shampoo.                           4.  Use CHG as you would any other liquid soap.  You can apply chg directly  to the skin and wash                       Gently with a scrungie or clean washcloth.  5.  Apply the CHG Soap to your body ONLY FROM THE NECK DOWN.   Do not use on face/ open                           Wound or open sores. Avoid contact with eyes, ears mouth and genitals (private parts).                       Wash face,  Genitals (private parts) with your normal soap.             6.  Wash thoroughly, paying special attention to the area where your surgery  will be performed.  7.  Thoroughly rinse your body with warm water from the neck down.  8.  DO NOT shower/wash with your normal soap after using and rinsing off  the CHG Soap.                9.  Pat yourself dry with a clean towel.            10.  Wear clean pajamas.            11.  Place clean sheets on your bed the night of your first shower and do not  sleep with pets. Day of Surgery : Do not apply  any lotions/deodorants the morning of surgery.  Please wear clean clothes to the hospital/surgery center.  FAILURE TO FOLLOW THESE INSTRUCTIONS MAY RESULT IN THE CANCELLATION OF YOUR SURGERY PATIENT SIGNATURE_________________________________  NURSE SIGNATURE__________________________________  ________________________________________________________________________

## 2020-05-09 ENCOUNTER — Encounter (HOSPITAL_COMMUNITY): Payer: Self-pay

## 2020-05-09 ENCOUNTER — Other Ambulatory Visit: Payer: Self-pay

## 2020-05-09 ENCOUNTER — Encounter (HOSPITAL_COMMUNITY)
Admission: RE | Admit: 2020-05-09 | Discharge: 2020-05-09 | Disposition: A | Discharge status: Home / Self Care | Payer: Medicare PPO | Source: Ambulatory Visit | Attending: Urology | Admitting: Urology

## 2020-05-09 DIAGNOSIS — Z01812 Encounter for preprocedural laboratory examination: Secondary | ICD-10-CM | POA: Insufficient documentation

## 2020-05-09 HISTORY — DX: Nonspecific intraventricular block: I45.4

## 2020-05-09 HISTORY — DX: Age-related osteoporosis without current pathological fracture: M81.0

## 2020-05-09 LAB — CBC
HCT: 41.9 % (ref 36.0–46.0)
Hemoglobin: 13.2 g/dL (ref 12.0–15.0)
MCH: 28.7 pg (ref 26.0–34.0)
MCHC: 31.5 g/dL (ref 30.0–36.0)
MCV: 91.1 fL (ref 80.0–100.0)
Platelets: 312 10*3/uL (ref 150–400)
RBC: 4.6 MIL/uL (ref 3.87–5.11)
RDW: 13.2 % (ref 11.5–15.5)
WBC: 6.4 10*3/uL (ref 4.0–10.5)
nRBC: 0 % (ref 0.0–0.2)

## 2020-05-09 LAB — BASIC METABOLIC PANEL
Anion gap: 8 (ref 5–15)
BUN: 24 mg/dL — ABNORMAL HIGH (ref 8–23)
CO2: 31 mmol/L (ref 22–32)
Calcium: 10.1 mg/dL (ref 8.9–10.3)
Chloride: 98 mmol/L (ref 98–111)
Creatinine, Ser: 0.96 mg/dL (ref 0.44–1.00)
GFR calc Af Amer: >60 mL/min (ref >60)
GFR calc non Af Amer: 53 mL/min — ABNORMAL LOW (ref >60)
Glucose, Bld: 89 mg/dL (ref 70–99)
Potassium: 4.4 mmol/L (ref 3.5–5.1)
Sodium: 137 mmol/L (ref 135–145)

## 2020-05-09 NOTE — Progress Notes (Signed)
COVID Vaccine Completed:yes  Date COVID Vaccine completed:10/29/19 COVID vaccine manufacturer: *[employer]    [employer]   PCP - Dr. Greer Pickerel Cardiologist - Dr. Godfrey Pick [place] LOV: 04/30/20  Chest x-ray -  EKG - 04/30/20 EPIC Stress Test -  ECHO - 04/30/20 EPIC Cardiac Cath -   Sleep Study -  CPAP -   Fasting Blood Sugar -  Checks Blood Sugar _____ times a day  Blood Thinner Instructions: Aspirin Instructions: Last Dose:  Anesthesia review: Hx: Lt. BBB.,HTN  Patient denies shortness of breath, fever, cough and chest pain at PAT appointment   Patient verbalized understanding of instructions that were given to them at the PAT appointment. Patient was also instructed that they will need to review over the PAT instructions again at home before surgery.

## 2020-05-12 NOTE — Anesthesia Preprocedure Evaluation (Addendum)
Anesthesia Evaluation  Patient identified by MRN, date of birth, ID band Patient awake    Reviewed: Allergy & Precautions, NPO status , Patient's Chart, lab work & pertinent test results  History of Anesthesia Complications Negative for: history of anesthetic complications  Airway Mallampati: III  TM Distance: <3 FB Neck ROM: Full    Dental  (+) Dental Advisory Given   Pulmonary neg pulmonary ROS, former smoker,  Covid-19 Nucleic Acid Test Results Lab Results      Component                Value               Date                      [place]              NEGATIVE            05/17/2020                [place]              NEGATIVE            04/19/2020                [place]              NEGATIVE            01/26/2020              breath sounds clear to auscultation       Cardiovascular hypertension, Pt. on medications (-) angina(-) Past MI + dysrhythmias  Rhythm:Regular     Neuro/Psych negative neurological ROS  negative psych ROS   GI/Hepatic negative GI ROS, Neg liver ROS,   Endo/Other  negative endocrine ROS  Renal/GU Renal diseaseLab Results      Component                Value               Date                      CREATININE               0.96                05/09/2020             UROTHELIAL CARCINOMA OF THE RIGHT RENAL PELVIS     Musculoskeletal   Abdominal   Peds  Hematology negative hematology ROS (+) Lab Results      Component                Value               Date                      WBC                      6.4                 05/09/2020                HGB                      13.2                05/09/2020  HCT                      41.9                05/09/2020                MCV                      91.1                05/09/2020                PLT                      312                 05/09/2020              Anesthesia Other Findings    Reproductive/Obstetrics negative OB ROS                           Anesthesia Physical Anesthesia Plan  ASA: II  Anesthesia Plan: General   Post-op Pain Management:    Induction: Intravenous  PONV Risk Score and Plan: 3 and Ondansetron and Dexamethasone  Airway Management Planned: Oral ETT  Additional Equipment: None  Intra-op Plan:   Post-operative Plan: Extubation in OR  Informed Consent: I have reviewed the patients History and Physical, chart, labs and discussed the procedure including the risks, benefits and alternatives for the proposed anesthesia with the patient or authorized representative who has indicated his/her understanding and acceptance.     Dental advisory given  Plan Discussed with: CRNA and Surgeon  Anesthesia Plan Comments: (Seen by cardiology 04/30/2020 for evaluation of new LBBB.  Per OV note, "The patient does not have any unstable cardiac conditions.  Upon evaluation today, she can achieve 4 METs or greater without anginal symptoms.  According to [facility] and AHA guidelines, she requires no further cardiac workup prior to her noncardiac surgery and should be at acceptable risk. Our service is available as necessary in the perioperative period, at which time please contact heart care. Due to concern for left atrial enlargement on her EKG I am getting an echocardiogram to rule out any other structural abnormalities.  This testing is not a criteria to clear the patient for her surgery therefore as stated above she can proceed with her noncardiac surgery." )       Anesthesia Quick Evaluation

## 2020-05-17 ENCOUNTER — Other Ambulatory Visit (HOSPITAL_COMMUNITY)
Admission: RE | Admit: 2020-05-17 | Discharge: 2020-05-17 | Disposition: A | Discharge status: Home / Self Care | Payer: Medicare PPO | Source: Ambulatory Visit | Attending: Urology | Admitting: Urology

## 2020-05-17 DIAGNOSIS — Z20822 Contact with and (suspected) exposure to covid-19: Secondary | ICD-10-CM | POA: Diagnosis not present

## 2020-05-17 DIAGNOSIS — Z01812 Encounter for preprocedural laboratory examination: Secondary | ICD-10-CM | POA: Insufficient documentation

## 2020-05-17 LAB — SARS CORONAVIRUS 2 (TAT 6-24 HRS): SARS Coronavirus 2: NEGATIVE

## 2020-05-20 NOTE — Progress Notes (Signed)
Pt. Was informed about time change for surgery.Pt. is aware of coming at 10:45 am tomorrow.Also, she knows that she can have clear liquids until 9:45 am.

## 2020-05-21 ENCOUNTER — Encounter (HOSPITAL_COMMUNITY): Payer: Self-pay | Admitting: Urology

## 2020-05-21 ENCOUNTER — Other Ambulatory Visit: Payer: Self-pay

## 2020-05-21 ENCOUNTER — Inpatient Hospital Stay (HOSPITAL_COMMUNITY)
Admission: RE | Admit: 2020-05-21 | Discharge: 2020-05-23 | DRG: 657 | Disposition: A | Discharge status: Home / Self Care | Payer: Medicare PPO | Attending: Urology | Admitting: Urology

## 2020-05-21 ENCOUNTER — Inpatient Hospital Stay (HOSPITAL_COMMUNITY): Payer: Medicare PPO | Admitting: Physician Assistant

## 2020-05-21 ENCOUNTER — Inpatient Hospital Stay (HOSPITAL_COMMUNITY): Payer: Medicare PPO | Admitting: Anesthesiology

## 2020-05-21 ENCOUNTER — Encounter (HOSPITAL_COMMUNITY)
Admission: RE | Disposition: A | Discharge status: Home / Self Care | Payer: Self-pay | Source: Home / Self Care | Attending: Urology

## 2020-05-21 DIAGNOSIS — C641 Malignant neoplasm of right kidney, except renal pelvis: Secondary | ICD-10-CM | POA: Diagnosis present

## 2020-05-21 DIAGNOSIS — M810 Age-related osteoporosis without current pathological fracture: Secondary | ICD-10-CM | POA: Diagnosis present

## 2020-05-21 DIAGNOSIS — C7971 Secondary malignant neoplasm of right adrenal gland: Secondary | ICD-10-CM | POA: Diagnosis present

## 2020-05-21 DIAGNOSIS — Z20822 Contact with and (suspected) exposure to covid-19: Secondary | ICD-10-CM | POA: Diagnosis not present

## 2020-05-21 DIAGNOSIS — E7800 Pure hypercholesterolemia, unspecified: Secondary | ICD-10-CM | POA: Diagnosis present

## 2020-05-21 DIAGNOSIS — C651 Malignant neoplasm of right renal pelvis: Secondary | ICD-10-CM | POA: Diagnosis not present

## 2020-05-21 DIAGNOSIS — N3289 Other specified disorders of bladder: Secondary | ICD-10-CM | POA: Diagnosis present

## 2020-05-21 DIAGNOSIS — Z90710 Acquired absence of both cervix and uterus: Secondary | ICD-10-CM

## 2020-05-21 DIAGNOSIS — I10 Essential (primary) hypertension: Secondary | ICD-10-CM | POA: Diagnosis not present

## 2020-05-21 DIAGNOSIS — Z8551 Personal history of malignant neoplasm of bladder: Secondary | ICD-10-CM | POA: Diagnosis not present

## 2020-05-21 DIAGNOSIS — Z87891 Personal history of nicotine dependence: Secondary | ICD-10-CM | POA: Diagnosis not present

## 2020-05-21 DIAGNOSIS — E279 Disorder of adrenal gland, unspecified: Secondary | ICD-10-CM | POA: Diagnosis not present

## 2020-05-21 HISTORY — PX: ROBOT ASSITED LAPAROSCOPIC NEPHROURETERECTOMY: SHX6077

## 2020-05-21 LAB — CBC
HCT: 42.1 % (ref 36.0–46.0)
Hemoglobin: 13.4 g/dL (ref 12.0–15.0)
MCH: 29 pg (ref 26.0–34.0)
MCHC: 31.8 g/dL (ref 30.0–36.0)
MCV: 91.1 fL (ref 80.0–100.0)
Platelets: 301 10*3/uL (ref 150–400)
RBC: 4.62 MIL/uL (ref 3.87–5.11)
RDW: 13.1 % (ref 11.5–15.5)
WBC: 18.3 10*3/uL — ABNORMAL HIGH (ref 4.0–10.5)
nRBC: 0 % (ref 0.0–0.2)

## 2020-05-21 LAB — TYPE AND SCREEN
ABO/RH(D): A NEG
Antibody Screen: NEGATIVE

## 2020-05-21 LAB — HEMOGLOBIN AND HEMATOCRIT, BLOOD
HCT: 41.5 % (ref 36.0–46.0)
Hemoglobin: 13.5 g/dL (ref 12.0–15.0)

## 2020-05-21 LAB — CREATININE, SERUM
Creatinine, Ser: 1.08 mg/dL — ABNORMAL HIGH (ref 0.44–1.00)
GFR calc Af Amer: 53 mL/min — ABNORMAL LOW (ref >60)
GFR calc non Af Amer: 45 mL/min — ABNORMAL LOW (ref >60)

## 2020-05-21 LAB — ABO/RH: ABO/RH(D): A NEG

## 2020-05-21 SURGERY — NEPHROURETERECTOMY, ROBOT-ASSISTED, LAPAROSCOPIC
Anesthesia: General | Laterality: Right

## 2020-05-21 MED ORDER — DEXAMETHASONE SODIUM PHOSPHATE 10 MG/ML IJ SOLN
INTRAMUSCULAR | Status: DC | PRN
  Administered 2020-05-21: 10 mg via INTRAVENOUS

## 2020-05-21 MED ORDER — PROMETHAZINE HCL 12.5 MG PO TABS: 12.5000 mg | ORAL_TABLET | ORAL | 0 refills | Status: DC | PRN

## 2020-05-21 MED ORDER — ONDANSETRON HCL 4 MG/2ML IJ SOLN: 4.0000 mg | INTRAMUSCULAR | Status: DC | PRN

## 2020-05-21 MED ORDER — HYDROCODONE-ACETAMINOPHEN 5-325 MG PO TABS: 1.0000 | ORAL_TABLET | Freq: Four times a day (QID) | ORAL | 0 refills | Status: DC | PRN

## 2020-05-21 MED ORDER — OXYCODONE HCL 5 MG PO TABS: 5.0000 mg | ORAL_TABLET | Freq: Once | ORAL | Status: DC | PRN

## 2020-05-21 MED ORDER — OXYCODONE HCL 5 MG PO TABS: 5.0000 mg | ORAL_TABLET | ORAL | Status: DC | PRN

## 2020-05-21 MED ORDER — CHLORHEXIDINE GLUCONATE CLOTH 2 % EX PADS
6.0000 | MEDICATED_PAD | Freq: Every day | CUTANEOUS | Status: DC
  Administered 2020-05-21: 6 via TOPICAL

## 2020-05-21 MED ORDER — DOCUSATE SODIUM 100 MG PO CAPS: 100.0000 mg | ORAL_CAPSULE | Freq: Two times a day (BID) | ORAL | Status: DC

## 2020-05-21 MED ORDER — ORAL CARE MOUTH RINSE: 15.0000 mL | Freq: Once | OROMUCOSAL | Status: AC

## 2020-05-21 MED ORDER — OXYCODONE HCL 5 MG/5ML PO SOLN: 5.0000 mg | Freq: Once | ORAL | Status: DC | PRN

## 2020-05-21 MED ORDER — PROPOFOL 10 MG/ML IV BOLUS
INTRAVENOUS | Status: AC
  Filled 2020-05-21: qty 20

## 2020-05-21 MED ORDER — LACTATED RINGERS IV SOLN: INTRAVENOUS | Status: DC | PRN

## 2020-05-21 MED ORDER — BUPIVACAINE-EPINEPHRINE 0.25% -1:200000 IJ SOLN
INTRAMUSCULAR | Status: DC | PRN
  Administered 2020-05-21: 10 mL

## 2020-05-21 MED ORDER — HYDROMORPHONE HCL 1 MG/ML IJ SOLN: 0.5000 mg | INTRAMUSCULAR | Status: DC | PRN

## 2020-05-21 MED ORDER — LACTATED RINGERS IR SOLN
Status: DC | PRN
  Administered 2020-05-21: 1000 mL

## 2020-05-21 MED ORDER — FENOFIBRATE 160 MG PO TABS
160.0000 mg | ORAL_TABLET | Freq: Every day | ORAL | Status: DC
  Administered 2020-05-21 – 2020-05-22 (×2): 160 mg via ORAL
  Filled 2020-05-21 (×2): qty 1

## 2020-05-21 MED ORDER — LACTATED RINGERS IV SOLN: INTRAVENOUS | Status: DC

## 2020-05-21 MED ORDER — DEXTROSE-NACL 5-0.45 % IV SOLN: INTRAVENOUS | Status: DC

## 2020-05-21 MED ORDER — ACETAMINOPHEN 160 MG/5ML PO SOLN: 1000.0000 mg | Freq: Once | ORAL | Status: DC | PRN

## 2020-05-21 MED ORDER — DOCUSATE SODIUM 100 MG PO CAPS
100.0000 mg | ORAL_CAPSULE | Freq: Two times a day (BID) | ORAL | Status: DC
  Administered 2020-05-21 – 2020-05-23 (×4): 100 mg via ORAL
  Filled 2020-05-21 (×4): qty 1

## 2020-05-21 MED ORDER — DIPHENHYDRAMINE HCL 50 MG/ML IJ SOLN: 12.5000 mg | Freq: Four times a day (QID) | INTRAMUSCULAR | Status: DC | PRN

## 2020-05-21 MED ORDER — PROPOFOL 10 MG/ML IV BOLUS
INTRAVENOUS | Status: DC | PRN
  Administered 2020-05-21: 90 mg via INTRAVENOUS

## 2020-05-21 MED ORDER — SODIUM CHLORIDE (PF) 0.9 % IJ SOLN
INTRAMUSCULAR | Status: AC
  Filled 2020-05-21: qty 20

## 2020-05-21 MED ORDER — ACETAMINOPHEN 325 MG PO TABS: 650.0000 mg | ORAL_TABLET | ORAL | Status: DC | PRN

## 2020-05-21 MED ORDER — CHLORHEXIDINE GLUCONATE 0.12 % MT SOLN
15.0000 mL | Freq: Once | OROMUCOSAL | Status: AC
  Administered 2020-05-21: 15 mL via OROMUCOSAL

## 2020-05-21 MED ORDER — FENTANYL CITRATE (PF) 100 MCG/2ML IJ SOLN
25.0000 ug | INTRAMUSCULAR | Status: DC | PRN
  Administered 2020-05-21 (×3): 50 ug via INTRAVENOUS

## 2020-05-21 MED ORDER — HEPARIN SODIUM (PORCINE) 5000 UNIT/ML IJ SOLN
5000.0000 [IU] | Freq: Three times a day (TID) | INTRAMUSCULAR | Status: DC
  Administered 2020-05-21 – 2020-05-23 (×5): 5000 [IU] via SUBCUTANEOUS
  Filled 2020-05-21 (×5): qty 1

## 2020-05-21 MED ORDER — FENTANYL CITRATE (PF) 100 MCG/2ML IJ SOLN
INTRAMUSCULAR | Status: AC
Start: 2020-05-21 — End: 2020-05-22
  Filled 2020-05-21: qty 4

## 2020-05-21 MED ORDER — CEFAZOLIN SODIUM-DEXTROSE 2-4 GM/100ML-% IV SOLN
2.0000 g | Freq: Once | INTRAVENOUS | Status: AC
  Administered 2020-05-21: 2 g via INTRAVENOUS
  Filled 2020-05-21: qty 100

## 2020-05-21 MED ORDER — CEFAZOLIN SODIUM-DEXTROSE 1-4 GM/50ML-% IV SOLN
1.0000 g | Freq: Three times a day (TID) | INTRAVENOUS | Status: AC
  Administered 2020-05-21 – 2020-05-22 (×2): 1 g via INTRAVENOUS
  Filled 2020-05-21 (×2): qty 50

## 2020-05-21 MED ORDER — BUPIVACAINE LIPOSOME 1.3 % IJ SUSP
20.0000 mL | Freq: Once | INTRAMUSCULAR | Status: AC
  Administered 2020-05-21: 20 mL
  Filled 2020-05-21: qty 20

## 2020-05-21 MED ORDER — BUPIVACAINE-EPINEPHRINE (PF) 0.25% -1:200000 IJ SOLN
INTRAMUSCULAR | Status: AC
  Filled 2020-05-21: qty 30

## 2020-05-21 MED ORDER — DIPHENHYDRAMINE HCL 12.5 MG/5ML PO ELIX: 12.5000 mg | ORAL_SOLUTION | Freq: Four times a day (QID) | ORAL | Status: DC | PRN

## 2020-05-21 MED ORDER — SUGAMMADEX SODIUM 200 MG/2ML IV SOLN
INTRAVENOUS | Status: DC | PRN
  Administered 2020-05-21: 150 mg via INTRAVENOUS

## 2020-05-21 MED ORDER — ACETAMINOPHEN 10 MG/ML IV SOLN
INTRAVENOUS | Status: AC
  Filled 2020-05-21: qty 100

## 2020-05-21 MED ORDER — ONDANSETRON HCL 4 MG/2ML IJ SOLN
INTRAMUSCULAR | Status: DC | PRN
  Administered 2020-05-21: 4 mg via INTRAVENOUS

## 2020-05-21 MED ORDER — FENTANYL CITRATE (PF) 250 MCG/5ML IJ SOLN
INTRAMUSCULAR | Status: AC
  Filled 2020-05-21: qty 5

## 2020-05-21 MED ORDER — FENTANYL CITRATE (PF) 100 MCG/2ML IJ SOLN
INTRAMUSCULAR | Status: DC | PRN
Start: 2020-05-21 — End: 2020-05-21
  Administered 2020-05-21 (×2): 50 ug via INTRAVENOUS
  Administered 2020-05-21: 100 ug via INTRAVENOUS
  Administered 2020-05-21: 50 ug via INTRAVENOUS

## 2020-05-21 MED ORDER — ACETAMINOPHEN 500 MG PO TABS: 1000.0000 mg | ORAL_TABLET | Freq: Once | ORAL | Status: DC | PRN

## 2020-05-21 MED ORDER — STERILE WATER FOR IRRIGATION IR SOLN
Status: DC | PRN
  Administered 2020-05-21: 1000 mL

## 2020-05-21 MED ORDER — BELLADONNA ALKALOIDS-OPIUM 16.2-60 MG RE SUPP
1.0000 | Freq: Four times a day (QID) | RECTAL | Status: DC | PRN
  Administered 2020-05-22: 1 via RECTAL
  Filled 2020-05-21: qty 1

## 2020-05-21 MED ORDER — ACETAMINOPHEN 10 MG/ML IV SOLN
1000.0000 mg | Freq: Once | INTRAVENOUS | Status: DC | PRN
  Administered 2020-05-21: 1000 mg via INTRAVENOUS

## 2020-05-21 MED ORDER — SODIUM CHLORIDE (PF) 0.9 % IJ SOLN
INTRAMUSCULAR | Status: DC | PRN
  Administered 2020-05-21: 20 mL

## 2020-05-21 MED ORDER — ROCURONIUM BROMIDE 10 MG/ML (PF) SYRINGE
PREFILLED_SYRINGE | INTRAVENOUS | Status: DC | PRN
  Administered 2020-05-21 (×3): 10 mg via INTRAVENOUS
  Administered 2020-05-21: 50 mg via INTRAVENOUS

## 2020-05-21 MED ORDER — LIDOCAINE 2% (20 MG/ML) 5 ML SYRINGE
INTRAMUSCULAR | Status: DC | PRN
  Administered 2020-05-21: 60 mg via INTRAVENOUS

## 2020-05-21 SURGICAL SUPPLY — 68 items
BAG LAPAROSCOPIC 12 15 PORT 16 (BASKET) ×2 IMPLANT
BAG RETRIEVAL 12/15 (BASKET) ×3
BAG URINE DRAIN 2000ML AR STRL (UROLOGICAL SUPPLIES) IMPLANT
BAG URO CATCHER STRL LF (MISCELLANEOUS) ×3 IMPLANT
CHLORAPREP W/TINT 26 (MISCELLANEOUS) ×3 IMPLANT
CLIP VESOLOCK LG 6/CT PURPLE (CLIP) ×3 IMPLANT
CLIP VESOLOCK MED LG 6/CT (CLIP) ×3 IMPLANT
CLIP VESOLOCK XL 6/CT (CLIP) ×3 IMPLANT
COVER SURGICAL LIGHT HANDLE (MISCELLANEOUS) ×3 IMPLANT
COVER TIP SHEARS 8 DVNC (MISCELLANEOUS) ×2 IMPLANT
COVER TIP SHEARS 8MM DA VINCI (MISCELLANEOUS) ×1
COVER WAND RF STERILE (DRAPES) IMPLANT
CUTTER ECHEON FLEX ENDO 45 340 (ENDOMECHANICALS) ×3 IMPLANT
DECANTER SPIKE VIAL GLASS SM (MISCELLANEOUS) ×3 IMPLANT
DERMABOND ADVANCED (GAUZE/BANDAGES/DRESSINGS) ×1
DERMABOND ADVANCED .7 DNX12 (GAUZE/BANDAGES/DRESSINGS) ×2 IMPLANT
DRAIN CHANNEL 15F RND FF 3/16 (WOUND CARE) ×3 IMPLANT
DRAPE ARM DVNC X/XI (DISPOSABLE) ×8 IMPLANT
DRAPE COLUMN DVNC XI (DISPOSABLE) ×2 IMPLANT
DRAPE DA VINCI XI ARM (DISPOSABLE) ×4
DRAPE DA VINCI XI COLUMN (DISPOSABLE) ×1
DRAPE INCISE IOBAN 66X45 STRL (DRAPES) ×3 IMPLANT
DRAPE LAPAROSCOPIC ABDOMINAL (DRAPES) ×3 IMPLANT
DRAPE SHEET LG 3/4 BI-LAMINATE (DRAPES) ×3 IMPLANT
DRSG TEGADERM 4X4.75 (GAUZE/BANDAGES/DRESSINGS) ×3 IMPLANT
ELECT REM PT RETURN 15FT ADLT (MISCELLANEOUS) ×3 IMPLANT
EVACUATOR SILICONE 100CC (DRAIN) ×3 IMPLANT
GLOVE BIO SURGEON STRL SZ 6.5 (GLOVE) ×3 IMPLANT
GLOVE BIOGEL M STRL SZ7.5 (GLOVE) ×6 IMPLANT
GOWN STRL REUS W/TWL LRG LVL3 (GOWN DISPOSABLE) ×9 IMPLANT
GOWN STRL REUS W/TWL XL LVL3 (GOWN DISPOSABLE) ×3 IMPLANT
HEMOSTAT SURGICEL 4X8 (HEMOSTASIS) ×3 IMPLANT
IRRIG SUCT STRYKERFLOW 2 WTIP (MISCELLANEOUS)
IRRIGATION SUCT STRKRFLW 2 WTP (MISCELLANEOUS) IMPLANT
KIT BASIN OR (CUSTOM PROCEDURE TRAY) ×3 IMPLANT
KIT TURNOVER KIT A (KITS) IMPLANT
LOOP CUT BIPOLAR 24F LRG (ELECTROSURGICAL) IMPLANT
MANIFOLD NEPTUNE II (INSTRUMENTS) ×3 IMPLANT
NEEDLE INSUFFLATION 14GA 120MM (NEEDLE) ×3 IMPLANT
NS IRRIG 1000ML POUR BTL (IV SOLUTION) ×3 IMPLANT
PACK CYSTO (CUSTOM PROCEDURE TRAY) ×3 IMPLANT
PENCIL SMOKE EVACUATOR (MISCELLANEOUS) IMPLANT
PROTECTOR NERVE ULNAR (MISCELLANEOUS) ×6 IMPLANT
SCISSORS LAP 5X35 DISP (ENDOMECHANICALS) ×3 IMPLANT
SEAL CANN UNIV 5-8 DVNC XI (MISCELLANEOUS) ×8 IMPLANT
SEAL XI 5MM-8MM UNIVERSAL (MISCELLANEOUS) ×4
SET TUBE SMOKE EVAC HIGH FLOW (TUBING) ×3 IMPLANT
SOLUTION ELECTROLUBE (MISCELLANEOUS) ×3 IMPLANT
STAPLE RELOAD 45 WHT (STAPLE) ×2 IMPLANT
STAPLE RELOAD 45MM WHITE (STAPLE) ×1
SUT ETHILON 3 0 PS 1 (SUTURE) ×3 IMPLANT
SUT MNCRL AB 4-0 PS2 18 (SUTURE) ×6 IMPLANT
SUT PDS AB 1 CTX 36 (SUTURE) ×6 IMPLANT
SUT V-LOC BARB 180 2/0GR6 GS22 (SUTURE)
SUT VICRYL 0 UR6 27IN ABS (SUTURE) ×3 IMPLANT
SUT VLOC BARB 180 ABS3/0GR12 (SUTURE)
SUTURE V-LC BRB 180 2/0GR6GS22 (SUTURE) IMPLANT
SUTURE VLOC BRB 180 ABS3/0GR12 (SUTURE) IMPLANT
SYR TOOMEY IRRIG 70ML (MISCELLANEOUS)
SYRINGE TOOMEY IRRIG 70ML (MISCELLANEOUS) IMPLANT
TOWEL OR NON WOVEN STRL DISP B (DISPOSABLE) ×3 IMPLANT
TRAY FOLEY MTR SLVR 16FR STAT (SET/KITS/TRAYS/PACK) ×3 IMPLANT
TRAY LAPAROSCOPIC (CUSTOM PROCEDURE TRAY) ×3 IMPLANT
TROCAR UNIVERSAL OPT 12M 100M (ENDOMECHANICALS) IMPLANT
TROCAR XCEL 12X100 BLDLESS (ENDOMECHANICALS) ×3 IMPLANT
TUBING CONNECTING 10 (TUBING) ×3 IMPLANT
TUBING UROLOGY SET (TUBING) ×3 IMPLANT
WATER STERILE IRR 1000ML POUR (IV SOLUTION) ×6 IMPLANT

## 2020-05-21 NOTE — Anesthesia Procedure Notes (Signed)
Procedure Name: Intubation Performed by: Monserath Neff J, CRNA Pre-anesthesia Checklist: Patient identified, Emergency Drugs available, Suction available, Patient being monitored and Timeout performed Patient Re-evaluated:Patient Re-evaluated prior to induction Oxygen Delivery Method: Circle system utilized Preoxygenation: Pre-oxygenation with 100% oxygen Induction Type: IV induction Ventilation: Mask ventilation without difficulty Laryngoscope Size: Mac and 3 Grade View: Grade I Tube type: Oral Tube size: 7.0 mm Number of attempts: 1 Airway Equipment and Method: Stylet Placement Confirmation: ETT inserted through vocal cords under direct vision,  positive ETCO2 and breath sounds checked- equal and bilateral Secured at: 21 cm Tube secured with: Tape Dental Injury: Teeth and Oropharynx as per pre-operative assessment        

## 2020-05-21 NOTE — H&P (Signed)
Urology Preoperative H&P   Chief Complaint: Urothelial carcinoma of the right renal pelvis and right adrenal masses  History of Present Illness: Kayla Bowers is a 86 y.o. female with a history of bladder cancer that was diagnosed and treated by Dr. Comer Locket in 2018. The patient has been referred by Dr. Gloriann Loan after she was found to have multiple lesions involving the right kidney concerning for possible urothelial carcinoma along with a 2 cm right adrenal nodule on recent MRI. CT with contrast from 10/2019 made no note of any solid and enhancing right renal parenchymal lesions. Per her records, she underwent right ureteroscopy in September of 2020 for kidney stones and a biopsy of a lesion in the right renal pelvis was performed, but no pathology report is available for review.   She underwent right ureteroscopy on 01/30/2020 and was found to have a 2 cm papillary bladder tumor involving the right renal pelvis. Brush biopsies of the lesion revealed high-grade papillary urothelial carcinoma.  Past surgical history is significant for an exploratory laparotomy following an episode of diverticulitis in 2010, open hysterectomy and abdominoplasty.    Past Medical History:  Diagnosis Date  . Bladder cancer ([place])   . Bundle branch block    left  . Closed wedge compression fracture of T2 vertebra ([place]) 07/09/2019  . History of kidney stones   . Hypercholesteremia   . Hypertension   . Kidney stone   . Osteoporosis     Past Surgical History:  Procedure Laterality Date  . ABDOMINAL HYSTERECTOMY    . APPENDECTOMY    . BLADDER SURGERY  01/2017   removal of a tumor  . CATARACT EXTRACTION    . COLON SURGERY     took out 12 inches  . COLONOSCOPY  11/19/2009   Mild pancolonic diverticulitis. Status post sigmoid resection. Small internal hemorrhoid. Otherwies normal colonsocopy.   . CYSTOSCOPY WITH URETEROSCOPY AND STENT PLACEMENT Right 01/30/2020   Procedure: CYSTOSCOPY WITH RIGHT  URETEROSCOPY/BIOPSY;  Surgeon: Ceasar Mons, MD;  [facility];  Service: Urology;  Laterality: Right;  . EYE SURGERY Bilateral 1990   Implant  . Tummy Tuck    . Vericose Vein Stripping      Allergies:  Allergies  Allergen Reactions  . Flomax [Tamsulosin Hcl] Nausea Only    Family History  Problem Relation Age of Onset  . Colon cancer Neg Hx   . Esophageal cancer Neg Hx     Social History:  reports that she has quit smoking. She has never used smokeless tobacco. She reports previous alcohol use. She reports that she does not use drugs.  ROS: A complete review of systems was performed.  All systems are negative except for pertinent findings as noted.  Physical Exam:  Vital signs in last 24 hours: Temp:  [97.8 F (36.6 C)] 97.8 F (36.6 C) (08/25 1100) Pulse Rate:  [76] 76 (08/25 1100) Resp:  [16] 16 (08/25 1100) BP: (187)/(94) 187/94 (08/25 1100) SpO2:  [100 %] 100 % (08/25 1100) Constitutional:  Alert and oriented, No acute distress Cardiovascular: Regular rate and rhythm, No JVD Respiratory: Normal respiratory effort, Lungs clear bilaterally GI: Abdomen is soft, nontender, nondistended, no abdominal masses GU: No CVA tenderness Lymphatic: No lymphadenopathy Neurologic: Grossly intact, no focal deficits Psychiatric: Normal mood and affect  Laboratory Data:  No results for input(s): WBC, HGB, HCT, PLT in the last 72 hours.  No results for input(s): NA, K, CL, GLUCOSE, BUN, CALCIUM, CREATININE in the last  72 hours.  Invalid input(s): CO3   No results found for this or any previous visit (from the past 24 hour(s)). Recent Results (from the past 240 hour(s))  SARS CORONAVIRUS 2 (TAT 6-24 HRS) Nasopharyngeal Nasopharyngeal Swab     Status: None   Collection Time: 05/17/20 10:33 AM   Specimen: Nasopharyngeal Swab  Result Value Ref Range Status   SARS Coronavirus 2 NEGATIVE NEGATIVE Final    Comment: (NOTE) SARS-CoV-2 target  nucleic acids are NOT DETECTED.  The SARS-CoV-2 RNA is generally detectable in upper and lower respiratory specimens during the acute phase of infection. Negative results do not preclude SARS-CoV-2 infection, do not rule out co-infections with other pathogens, and should not be used as the sole basis for treatment or other patient management decisions. Negative results must be combined with clinical observations, patient history, and epidemiological information. The expected result is Negative.  Fact Sheet for Patients: SugarRoll.be  Fact Sheet for Healthcare Providers: https://www.woods-mathews.com/  This test is not yet approved or cleared by the [place] FDA and  has been authorized for detection and/or diagnosis of SARS-CoV-2 by FDA under an Emergency Use Authorization (EUA). This EUA will remain  in effect (meaning this test can be used) for the duration of the COVID-19 declaration under Se ction 564(b)(1) of the Act, 21 U.S.C. section 360bbb-3(b)(1), unless the authorization is terminated or revoked sooner.  Performed at [facility], [place] [address], [place],  [address]     Renal Function: No results for input(s): CREATININE in the last 168 hours. Estimated Creatinine Clearance: 36.5 mL/min (by C-G formula based on SCr of 0.96 mg/dL).  Radiologic Imaging: CLINICAL DATA: New right adrenal mass   EXAM:  MRI ABDOMEN WITHOUT AND WITH CONTRAST   TECHNIQUE:  Multiplanar multisequence MR imaging of the abdomen was performed  both before and after the administration of intravenous contrast.   CONTRAST: 10mL MULTIHANCE GADOBENATE DIMEGLUMINE 529 MG/ML IV SOLN   COMPARISON: CT abdomen dated to 08/2020. CT abdomen/pelvis dated  06/15/2019.   FINDINGS:  Motion degraded images.   Lower chest: Small right pleural effusion.   Hepatobiliary: Liver is within normal limits. No  suspicious/enhancing hepatic lesions.  No hepatic steatosis. No  morphologic findings of cirrhosis.   Gallbladder is unremarkable. No intrahepatic or extrahepatic ductal  dilatation.   Pancreas: Within normal limits.   Spleen: Within normal limits.   Adrenals/Urinary Tract: 2.1 x 2.2 cm right adrenal mass (series  6/image 24), measuring 1.9 x 2.2 cm on recent CTA but new from  September 2020. Additional 1.4 x 1.6 cm enhancing medial right  adrenal nodule (series 6/image 21), 1.3 x 1.6 cm on recent CTA but  new from 2020. No intracellular lipid on opposed phase imaging.  These are both considered suspicious for metastatic disease.   16 mm left adrenal adenoma, benign.   Malrotated right kidney with enhancing soft tissue in the posterior  right upper pole collecting system measuring 1.8 x 2.0 cm (series  9/image 24), highly suspicious for urothelial neoplasm such as  transitional cell carcinoma. Associated restricted diffusion (series  7/image 80; series 8/image 20).   Tiny left renal cysts. No hydronephrosis.   Stomach/Bowel: Stomach and visualized bowel are grossly  unremarkable.   Vascular/Lymphatic: No evidence of abdominal aortic aneurysm.   No suspicious abdominal lymphadenopathy.   Other: No abdominal ascites.   Musculoskeletal: No focal osseous lesions.   IMPRESSION:  Motion degraded images.   1.8 x 2.0 cm enhancing lesion in the  right upper pole renal  collecting system, highly suspicious for urothelial neoplasm such as  transitional cell carcinoma.   Two right renal masses measuring up to 2.2 cm, new from September  2020, compatible with metastatic disease.   Small right pleural effusion.   Electronically Signed:  By: Julian Hy M.D.  On: 01/02/2020 09:43   ADDENDUM REPORT: 01/11/2020 09:10   ADDENDUM:  Dictation error in the impression original report.   Two right ADRENAL masses measuring up to 2.2 cm, new from September  2020, compatible metastatic disease.   Remainder of the  report is unchanged.   This was discussed with Dr. Lovena Neighbours at the time of addendum.    Electronically Signed  By: Julian Hy M.D.  I independently reviewed the above imaging studies.  Assessment and Plan Kayla Bowers is a 86 y.o. female with high grade UCC of the right renal pelvis along with 2 right adrenal masses   The risk, benefits and alternatives of robot-assisted laparoscopic right nephroureterectomy and right adrenalectomy was discussed in detail. Risks include, but are not limited to, bleeding, infection, adjacent organ/bowel injury, catastrophic bleeding, disease recurrence, MI, CVA, PE/DVT and the inherent risk of general anesthesia.  The patient I specifically discussed, in the event of extensive abdominal adhesions, that she would not want to have her surgery converted to an open approach.  She voices understanding wishes to proceed.  Ellison Hughs, MD 05/21/2020, 11:02 AM  [facility] Pager: [phone]

## 2020-05-21 NOTE — Discharge Instructions (Signed)

## 2020-05-21 NOTE — Transfer of Care (Signed)
Immediate Anesthesia Transfer of Care Note  Patient: Kayla Bowers  Procedure(s) Performed: XI ROBOT ASSITED LAPAROSCOPIC NEPHROURETERECTOMY,ADRENALECTOMY (Right )  Patient Location: PACU  Anesthesia Type:General  Level of Consciousness: sedated, patient cooperative and responds to stimulation  Airway & Oxygen Therapy: Patient Spontanous Breathing and Patient connected to face mask oxygen  Post-op Assessment: Report given to RN and Post -op Vital signs reviewed and stable  Post vital signs: Reviewed and stable  Last Vitals:  Vitals Value Taken Time  BP 156/90 05/21/20 1718  Temp    Pulse 89 05/21/20 1719  Resp 17 05/21/20 1721  SpO2 100 % 05/21/20 1719  Vitals shown include unvalidated device data.  Last Pain:  Vitals:   05/21/20 1157  TempSrc:   PainSc: 0-No pain      Patients Stated Pain Goal: 4 (62/95/28 4132)  Complications: No complications documented.

## 2020-05-21 NOTE — Op Note (Signed)
Operative Note  Preoperative diagnosis:  1.  High grade urothelial carcinoma of the right renal pelvis 2.  Right adrenal nodules  Postoperative diagnosis: 1.  High grade urothelial carcinoma of the right renal pelvis 2.  Right adrenal nodules  Procedure(s): 1.  Robot-assisted right nephroureterectomy and right adrenalectomy  Surgeon: Ellison Hughs, MD  Assistants:   1.  Debbrah Alar, [facility]  An assistant was required for this surgical procedure.  The duties of the assistant included but were not limited to suctioning, passing suture, camera manipulation, retraction.  This procedure would not be able to be performed without an Environmental consultant.   2.  Celene Squibb, MD PGY-4  Anesthesia:  General  Complications:  None  EBL:  150 mL  Specimens: 1.  Right kidney and ureter 2.  Right adrenal gland  Drains/Catheters: 1.  Foley catheter  Intraoperative findings:   1. Intraabdominal adhesions 2. Multiple right adrenal nodules and enlarged perhilar lymph nodes   Indication:  Kayla Bowers is a 86 y.o. female with high grade UCC of the right renal pelvis along with multiple right adrenal nodules.  She as been consented for the above procedures, voices understanding and wishes to proceed.   Description of procedure:  After informed consent was signed, the patient was taken back to the operating room and properly anesthetized.  The patient was then placed in the left lateral decubitus position with all pressure points padded.  The abdomen was then prepped and draped in the usual sterile fashion.  A time-out was then performed.     An 8 mm incision was then made lateral to the right rectus muscle at the level of the right 12th rib.  A Veress needle was then used to access the abdominal cavity.  A saline drop test showed no signs of obstruction and aspiration of the Veress needle revealed no blood or sucus.  The abdominal cavity was then insufflated to 15 mmHg.  An 8 mm robotic trocar  was then atraumatically inserted into the abdominal cavity.  The robotic camera was then inserted through the port and inspection of the abdominal cavity revealed no evidence of adjacent organ or vessel injury. We then placed three additional 8 mm robotic ports and a 12 mm assistant port in such as fashion as to triangulate the right renal hilum.  The robot was then docked into postion.   Using a combination of blunt and cold scissors dissection, the hepatic attachments were released from the abdominal sidewall.  The white line of Toldt along the ascending colon was then incised, allowing [place] to reflect the colon medially and expose the anterior surface of the right kidney.  The duodenum was then Kocherized medially, which abruptly led [place] to the identification of the inferior vena cava.    Once the colon was adequately mobilized, we moved to the lower pole and identified the gonadal vein and ureter.  The gonadal vein was then left running parallel to the vena cava and the right ureter was reflected anteriorly.  Using cautious cautery, the overlying perihilar attachments were then released.  This yielded visualization of the renal hilum, which included a single right renal vein and a single right renal artery.  The perilymphatic tissue surrounding the right renal artery were carefully released so that the right renal artery was fully encircled.   There were multiple dense lymph nodes surrounding the right renal hilum that were dissected free and sent with the final specimen.     Hemo-lock clips were then  applied to the right renal artery along with a lower pole accessory artery.  A 45 mm powered endovascular stapler was then used to ligate the right renal vein.  The right hilar stump was hemostatic following clip and staple ligation.   The right adrenal gland was then carefully dissected away from the lateral aspects of the vena cava.  Hemo-lock clips were used to ligate the right adrenal artery and veins.  The  remaining perinephric attachments were then incised using electrocautery. Reinspection of the right retroperitoneal space revealed excellent hemostasis.   The right ureter was then traced down into the pelvis until it was seen inserting into the bladder.  A hemo-lock clips was then used to ligate the distal most aspects of the right ureter as it entered into the bladder. The right ureter was then sharply excised.  Once the right kidney was fully mobile, it was placed in an Endo Catch bag and left in the abdominal cavity.   The 12 mm midline assistant port was then closed with a 0 Vicryl suture.  A right lower quadrant Gibson incision was then made in the right kidney was removed within the Endo Catch bag.  The fascia of the external and internal oblique were then closed with a running 0 PDS suture.  The skin incisions were then closed using 4-0 Monocryl.  Dermabond was applied to all skin incisions.  Plan:  Will monitor the patient on the floor overnight.

## 2020-05-22 ENCOUNTER — Encounter (HOSPITAL_COMMUNITY): Payer: Self-pay | Admitting: Urology

## 2020-05-22 LAB — BASIC METABOLIC PANEL
Anion gap: 9 (ref 5–15)
BUN: 13 mg/dL (ref 8–23)
CO2: 26 mmol/L (ref 22–32)
Calcium: 9.3 mg/dL (ref 8.9–10.3)
Chloride: 97 mmol/L — ABNORMAL LOW (ref 98–111)
Creatinine, Ser: 1.03 mg/dL — ABNORMAL HIGH (ref 0.44–1.00)
GFR calc Af Amer: 56 mL/min — ABNORMAL LOW (ref >60)
GFR calc non Af Amer: 48 mL/min — ABNORMAL LOW (ref >60)
Glucose, Bld: 148 mg/dL — ABNORMAL HIGH (ref 70–99)
Potassium: 3.9 mmol/L (ref 3.5–5.1)
Sodium: 132 mmol/L — ABNORMAL LOW (ref 135–145)

## 2020-05-22 LAB — HEMOGLOBIN AND HEMATOCRIT, BLOOD
HCT: 39 % (ref 36.0–46.0)
Hemoglobin: 12.5 g/dL (ref 12.0–15.0)

## 2020-05-22 NOTE — Progress Notes (Signed)
1 Day Post-Op Subjective: NAEON. Had some bladder spasms with foley that resolved with repositioning foley and B&O suppository.Tolerating CLD without nausea/emesis. Passed some gas this morning. On bedrest overnight. Pain well-controlled.   Objective: Vital signs in last 24 hours: Temp:  [97.7 F (36.5 C)-98.5 F (36.9 C)] 97.8 F (36.6 C) (08/26 0544) Pulse Rate:  [65-91] 65 (08/26 0544) Resp:  [12-18] 18 (08/26 0544) BP: (150-187)/(64-94) 150/67 (08/26 0544) SpO2:  [100 %] 100 % (08/26 0544) Weight:  [63 kg] 63 kg (08/25 1157)  Intake/Output from previous day: 08/25 0701 - 08/26 0700 In: 2183.6 [I.V.:2033.6; IV Piggyback:150] Out: 1191 [YNWGN:5621; Blood:150] Intake/Output this shift: No intake/output data recorded.  Physical Exam:  General: Alert and oriented CV: Regular rate Lungs: NWOB on RA Abdomen: Soft, appropriately tender, distended Incisions: C/D/I without signs of infection Ext: WWP  Lab Results: Recent Labs    05/21/20 1733 05/22/20 0523  HGB 13.4  13.5 12.5  HCT 42.1  41.5 39.0   BMET Recent Labs    05/21/20 1733 05/22/20 0523  NA  --  132*  K  --  3.9  CL  --  97*  CO2  --  26  GLUCOSE  --  148*  BUN  --  13  CREATININE 1.08* 1.03*  CALCIUM  --  9.3     Studies/Results: No results found.  Assessment/Plan: 86yo female with right HG UTUC and local metastasis to right adrenal gland who is now POD1 s/p right nephroureterectomy and adrenalectomy on 05/21/20. Overall doing well.  - Transition to reg diet. Stop IVF - Ambulate - Remove foley and TOV - SQH, SCDs for DVT ppx - EDD later today versus tomorrow     LOS: 1 day   Celene Squibb 05/22/2020, 8:04 AM

## 2020-05-23 LAB — BASIC METABOLIC PANEL
Anion gap: 8 (ref 5–15)
BUN: 20 mg/dL (ref 8–23)
CO2: 27 mmol/L (ref 22–32)
Calcium: 9.7 mg/dL (ref 8.9–10.3)
Chloride: 100 mmol/L (ref 98–111)
Creatinine, Ser: 1.07 mg/dL — ABNORMAL HIGH (ref 0.44–1.00)
GFR calc Af Amer: 53 mL/min — ABNORMAL LOW (ref >60)
GFR calc non Af Amer: 46 mL/min — ABNORMAL LOW (ref >60)
Glucose, Bld: 97 mg/dL (ref 70–99)
Potassium: 4 mmol/L (ref 3.5–5.1)
Sodium: 135 mmol/L (ref 135–145)

## 2020-05-23 LAB — HEMOGLOBIN AND HEMATOCRIT, BLOOD
HCT: 37.4 % (ref 36.0–46.0)
Hemoglobin: 12 g/dL (ref 12.0–15.0)

## 2020-05-23 NOTE — Discharge Summary (Signed)
[facility] Discharge Summary  Admit date: 05/21/2020  Discharge date and time: 05/23/20   Discharge to: Home  Discharge Service: Urology  Discharge Attending Physician:  Dr. Ellison Hughs  Discharge  Diagnoses: Renal cancer, right [facility])  Secondary Diagnosis: Principal Problem:   Renal cancer, right ([place])  OR Procedures: Procedure(s): XI ROBOT ASSITED LAPAROSCOPIC NEPHROURETERECTOMY,ADRENALECTOMY 05/21/2020   Ancillary Procedures: None   Discharge Day Services: The patient was seen and examined by the Urology team both in the morning and immediately prior to discharge.  Vital signs and laboratory values were stable and within normal limits.  The physical exam was benign and unchanged, and all surgical wounds were examined.  Discharge instructions were explained and all questions answered.  Subjective  No acute events overnight. Pain Controlled. Passing flatus. No fever or chills.  Objective Patient Vitals for the past 8 hrs:  BP Temp Temp src Pulse Resp SpO2  05/23/20 0528 (!) 169/65 98.4 F (36.9 C) Oral 67 18 98 %   No intake/output data recorded.  General Appearance:        No acute distress Lungs:                      Normal work of breathing on room air Heart:                                Regular rate  Abdomen:                         Soft, moderately distended but improved, appropriately tender. Incisions clean/dry/intact without signs of infection GU:         Voiding spontaneously  Extremities:                      Warm and well perfused  Hospital Course:  The patient underwent right nephroureterectomy and adrenalectomy on 05/21/2020 with Dr. Lovena Neighbours for history of right UTUC with local adrenal metastases.  The patient tolerated the procedure well, was extubated in the OR, and afterwards was taken to the PACU for routine post-surgical care. When stable the patient was transferred to the floor.   The patient did well postoperatively.    Her foley catheter  was removed on POD1, and she was able to void spontaneously. She developed abdominal distention post-operatively, but her diet was gradually advanced without issues, and  at the time of discharge was tolerating a regular diet without nausea/emesis. She was ambulating and passing flatus by time of discharge with improving abdominal distention. Her pain was controlled with analgesics.   The patient was discharged home 2 Days Post-Op, at which point was tolerating a regular solid diet, was able to void spontaneously, have adequate pain control with P.O. pain medication, and could ambulate without difficulty. The patient will follow up with [place] for post op check.   Condition at Discharge: Improved  Discharge Medications:  Allergies as of 05/23/2020      Reactions   Flomax [tamsulosin Hcl] Nausea Only      Medication List    STOP taking these medications   MULTIVITAMIN ADULT PO   PRESERVISION AREDS PO   PROBIOTIC DAILY PO     TAKE these medications   docusate sodium 100 MG capsule Commonly known as: COLACE Take 1 capsule (100 mg total) by mouth 2 (two) times daily.   fenofibrate 160 MG tablet Take 160  mg by mouth at bedtime.   hydrochlorothiazide 12.5 MG capsule Commonly known as: MICROZIDE Take 12.5 mg by mouth daily.   HYDROcodone-acetaminophen 5-325 MG tablet Commonly known as: Norco Take 1-2 tablets by mouth every 6 (six) hours as needed for moderate pain.   promethazine 12.5 MG tablet Commonly known as: PHENERGAN Take 1 tablet (12.5 mg total) by mouth every 4 (four) hours as needed for nausea or vomiting.            Discharge Care Instructions  (From admission, onward)         Start     Ordered   05/23/20 0000  No dressing needed        05/23/20 0734

## 2020-05-23 NOTE — Progress Notes (Signed)
Pt off the unit with staff and spouse via wheelchair. Verbalized understanding of dc instructions.

## 2020-05-23 NOTE — Anesthesia Postprocedure Evaluation (Signed)
Anesthesia Post Note  Patient: Kayla Bowers  Procedure(s) Performed: XI ROBOT ASSITED LAPAROSCOPIC [place] (Right )     Patient location during evaluation: PACU Anesthesia Type: General Level of consciousness: patient cooperative and awake Pain management: pain level controlled Vital Signs Assessment: post-procedure vital signs reviewed and stable Respiratory status: spontaneous breathing, nonlabored ventilation, respiratory function stable and patient connected to nasal cannula oxygen Cardiovascular status: blood pressure returned to baseline and stable Postop Assessment: no apparent nausea or vomiting Anesthetic complications: no   No complications documented.  Last Vitals:  Vitals:   05/22/20 2132 05/23/20 0528  BP: (!) 163/63 (!) 169/65  Pulse: 75 67  Resp: 18 18  Temp: 36.8 C 36.9 C  SpO2: 97% 98%    Last Pain:  Vitals:   05/23/20 0900  TempSrc:   PainSc: 0-No pain                 Chrisie Jankovich

## 2020-05-26 LAB — SURGICAL PATHOLOGY

## 2020-05-28 DIAGNOSIS — I700 Atherosclerosis of aorta: Secondary | ICD-10-CM | POA: Diagnosis not present

## 2020-05-28 DIAGNOSIS — C799 Secondary malignant neoplasm of unspecified site: Secondary | ICD-10-CM | POA: Diagnosis not present

## 2020-05-28 DIAGNOSIS — N1831 Chronic kidney disease, stage 3a: Secondary | ICD-10-CM | POA: Diagnosis not present

## 2020-05-29 ENCOUNTER — Other Ambulatory Visit: Payer: Self-pay

## 2020-05-29 NOTE — Patient Outreach (Signed)
[facility]) Care Management  05/29/2020  Kayla Bowers 1929-02-12 [id number]   EMMI- General Discharge RED ON EMMI ALERT Day # 4 Date: 05/28/20 Red Alert Reason: Other questions/problems? yes  Outreach attempt: Spoke with patient.  She reports that she is doing better everyday.  She states that she has support of her husband.  She has follow up with surgeon 06/05/20. Denies any problems with transportation.  Discussed red alert. Patient reports she had some questions about constipation.  She reports having some trouble but took liquid colace instead of pill and she got results.  She states she now will go back to colace regularly. Discussed colace action and importance to take regularly especially while taking pain medication. She verbalized understanding.  She denies any signs of infection to surgical sites and only a little bit of soreness.  Patient declines need for further nurse/THN follow up at this time.     Plan: RN CM will send letter and close case.    Jone Baseman, RN, MSN [facility] Care Management Care Management Coordinator Direct Line [phone] Toll Free: [phone]  Fax: [phone]

## 2020-05-30 ENCOUNTER — Other Ambulatory Visit: Payer: Medicare PPO

## 2020-06-05 ENCOUNTER — Ambulatory Visit: Payer: Medicare PPO | Admitting: Cardiology

## 2020-06-05 DIAGNOSIS — C651 Malignant neoplasm of right renal pelvis: Secondary | ICD-10-CM | POA: Diagnosis not present

## 2020-06-27 ENCOUNTER — Inpatient Hospital Stay: Payer: Medicare PPO | Attending: Oncology | Admitting: Oncology

## 2020-06-27 ENCOUNTER — Other Ambulatory Visit: Payer: Self-pay

## 2020-06-27 VITALS — BP 158/71 | HR 66 | Temp 97.3°F | Resp 16 | Ht 65.5 in | Wt 141.9 lb

## 2020-06-27 DIAGNOSIS — I700 Atherosclerosis of aorta: Secondary | ICD-10-CM | POA: Diagnosis not present

## 2020-06-27 DIAGNOSIS — N1831 Chronic kidney disease, stage 3a: Secondary | ICD-10-CM | POA: Diagnosis not present

## 2020-06-27 DIAGNOSIS — C641 Malignant neoplasm of right kidney, except renal pelvis: Secondary | ICD-10-CM

## 2020-06-27 DIAGNOSIS — Z79899 Other long term (current) drug therapy: Secondary | ICD-10-CM | POA: Insufficient documentation

## 2020-06-27 DIAGNOSIS — C651 Malignant neoplasm of right renal pelvis: Secondary | ICD-10-CM | POA: Insufficient documentation

## 2020-06-27 DIAGNOSIS — C779 Secondary and unspecified malignant neoplasm of lymph node, unspecified: Secondary | ICD-10-CM | POA: Diagnosis not present

## 2020-06-27 DIAGNOSIS — Z5111 Encounter for antineoplastic chemotherapy: Secondary | ICD-10-CM | POA: Insufficient documentation

## 2020-06-27 DIAGNOSIS — Z23 Encounter for immunization: Secondary | ICD-10-CM | POA: Insufficient documentation

## 2020-06-27 DIAGNOSIS — C799 Secondary malignant neoplasm of unspecified site: Secondary | ICD-10-CM | POA: Diagnosis not present

## 2020-06-27 DIAGNOSIS — C649 Malignant neoplasm of unspecified kidney, except renal pelvis: Secondary | ICD-10-CM

## 2020-06-27 HISTORY — DX: Malignant neoplasm of unspecified kidney, except renal pelvis: C64.9

## 2020-06-27 MED ORDER — PROCHLORPERAZINE MALEATE 10 MG PO TABS: 10.0000 mg | ORAL_TABLET | Freq: Four times a day (QID) | ORAL | 0 refills | Status: DC | PRN

## 2020-06-27 MED ORDER — LIDOCAINE-PRILOCAINE 2.5-2.5 % EX CREA: 1.0000 "application " | TOPICAL_CREAM | CUTANEOUS | 0 refills | Status: DC | PRN

## 2020-06-27 NOTE — Progress Notes (Signed)
START OFF PATHWAY REGIMEN - Bladder   [id number]:Carboplatin + Gemcitabine (5/1,000) q21 Days:   A cycle is every 21 days:     Carboplatin      Gemcitabine   **Always confirm dose/schedule in your pharmacy ordering system**  Patient Characteristics: Post-Cystectomy without Neoadjuvant Therapy (Pathologic Staging), pT0-4a, pN2-3, M0 Therapeutic Status: Post-Cystectomy without Neoadjuvant Therapy (Pathologic Staging) AJCC M Category: cM0 AJCC 8 Stage Grouping: IIIB AJCC T Category: pT3 AJCC N Category: pN2 Intent of Therapy: Curative Intent, Discussed with Patient

## 2020-06-27 NOTE — Progress Notes (Signed)
Reason for the request:    Urothelial carcinoma of the renal pelvis  HPI: I was asked by Dr. Lovena Neighbours to evaluate Kayla Bowers for the evaluation of urothelial carcinoma.  She is an 86 year old woman diagnosed with bladder cancer in 2018.  She was found to have right kidney lesions that are suspicious for urothelial carcinoma.  MRI on January 01, 2020 showed a 2.2 cm of the right adrenal gland as well as 1.8 x 2.0 cm enhancing lesion in the right upper pole of the renal collecting system.  Based on these findings, she underwent cystoscopy and right ureteroscopy and a right renal mass biopsy on Jan 30, 2020.  This showed high-grade urothelial carcinoma of the renal pelvis.  She subsequently underwent robotic assisted right nephro ureterectomy and right adrenalectomy by Dr. Lovena Neighbours on May 21, 2020.  The final pathology showed invasive high-grade urothelial carcinoma with necrosis measuring 4.6 and a 1.6 cm involving the renal pelvis.  The tumor invades beyond the muscularis indicating T3 disease.  1 out of 2 lymph nodes were involved with malignancy.  Her adrenal gland showed no evidence of malignancy.  She tolerated the procedure well without any complications.  She denies any abdominal pain, hematuria or dysuria.  She has resumed all activities of daily living.  She continues to enjoy excellent quality of life and performance status.     She does not report any headaches, blurry vision, syncope or seizures. Does not report any fevers, chills or sweats.  Does not report any cough, wheezing or hemoptysis.  Does not report any chest pain, palpitation, orthopnea or leg edema.  Does not report any nausea, vomiting or abdominal pain.  Does not report any constipation or diarrhea.  Does not report any skeletal complaints.    Does not report frequency, urgency or hematuria.  Does not report any skin rashes or lesions. Does not report any heat or cold intolerance.  Does not report any lymphadenopathy or petechiae.  Does  not report any anxiety or depression.  Remaining review of systems is negative.    Past Medical History:  Diagnosis Date  . Bladder cancer ([place])   . Bundle branch block    left  . Closed wedge compression fracture of T2 vertebra ([place]) 07/09/2019  . History of kidney stones   . Hypercholesteremia   . Hypertension   . Kidney stone   . Osteoporosis   :  Past Surgical History:  Procedure Laterality Date  . ABDOMINAL HYSTERECTOMY    . APPENDECTOMY    . BLADDER SURGERY  01/2017   removal of a tumor  . CATARACT EXTRACTION    . COLON SURGERY     took out 12 inches  . COLONOSCOPY  11/19/2009   Mild pancolonic diverticulitis. Status post sigmoid resection. Small internal hemorrhoid. Otherwies normal colonsocopy.   . CYSTOSCOPY WITH URETEROSCOPY AND STENT PLACEMENT Right 01/30/2020   Procedure: CYSTOSCOPY WITH RIGHT URETEROSCOPY/BIOPSY;  Surgeon: Ceasar Mons, MD;  Location: [facility];  Service: Urology;  Laterality: Right;  . EYE SURGERY Bilateral 1990   Implant  . ROBOT ASSITED LAPAROSCOPIC NEPHROURETERECTOMY Right 05/21/2020   Procedure: XI ROBOT ASSITED LAPAROSCOPIC [place];  Surgeon: Ceasar Mons, MD;  Location: [facility];  Service: Urology;  Laterality: Right;  . Tummy Tuck    . Vericose Vein Stripping    :   Current Outpatient Medications:  .  docusate sodium (COLACE) 100 MG capsule, Take 1 capsule (100 mg total) by mouth 2 (two) times daily.,  Disp: , Rfl:  .  fenofibrate 160 MG tablet, Take 160 mg by mouth at bedtime., Disp: , Rfl:  .  hydrochlorothiazide (MICROZIDE) 12.5 MG capsule, Take 12.5 mg by mouth daily. , Disp: , Rfl:  .  HYDROcodone-acetaminophen (NORCO) 5-325 MG tablet, Take 1-2 tablets by mouth every 6 (six) hours as needed for moderate pain., Disp: 20 tablet, Rfl: 0 .  promethazine (PHENERGAN) 12.5 MG tablet, Take 1 tablet (12.5 mg total) by mouth every 4 (four) hours as needed for nausea or vomiting.,  Disp: 15 tablet, Rfl: 0:  Allergies  Allergen Reactions  . Flomax [Tamsulosin Hcl] Nausea Only  :  Family History  Problem Relation Age of Onset  . Colon cancer Neg Hx   . Esophageal cancer Neg Hx   :  Social History   Socioeconomic History  . Marital status: Married    Spouse name: Not on file  . Number of children: 2  . Years of education: Not on file  . Highest education level: Not on file  Occupational History  . Not on file  Tobacco Use  . Smoking status: Former Research scientist (life sciences)  . Smokeless tobacco: Never Used  Vaping Use  . Vaping Use: Never used  Substance and Sexual Activity  . Alcohol use: Not Currently    Comment: quit 2 years ago   . Drug use: Never  . Sexual activity: Not Currently    Birth control/protection: Post-menopausal  Other Topics Concern  . Not on file  Social History Narrative  . Not on file   Social Determinants of Health   Financial Resource Strain:   . Difficulty of Paying Living Expenses: Not on file  Food Insecurity:   . Worried About Charity fundraiser in the Last Year: Not on file  . Ran Out of Food in the Last Year: Not on file  Transportation Needs:   . Lack of Transportation (Medical): Not on file  . Lack of Transportation (Non-Medical): Not on file  Physical Activity:   . Days of Exercise per Week: Not on file  . Minutes of Exercise per Session: Not on file  Stress:   . Feeling of Stress : Not on file  Social Connections:   . Frequency of Communication with Friends and Family: Not on file  . Frequency of Social Gatherings with Friends and Family: Not on file  . Attends Religious Services: Not on file  . Active Member of Clubs or Organizations: Not on file  . Attends Archivist Meetings: Not on file  . Marital Status: Not on file  Intimate Partner Violence:   . Fear of Current or Ex-Partner: Not on file  . Emotionally Abused: Not on file  . Physically Abused: Not on file  . Sexually Abused: Not on file   :  Pertinent items are noted in HPI.  Exam: Blood pressure (!) 158/71, pulse 66, temperature (!) 97.3 F (36.3 C), temperature source Tympanic, resp. rate 16, height 5' 5.5" (1.664 m), weight 141 lb 14.4 oz (64.4 kg), SpO2 100 %.   ECOG 1 General appearance: alert and cooperative appeared without distress. Head: atraumatic without any abnormalities. Eyes: conjunctivae/corneas clear. PERRL.  Sclera anicteric. Throat: lips, mucosa, and tongue normal; without oral thrush or ulcers. Resp: clear to auscultation bilaterally without rhonchi, wheezes or dullness to percussion. Cardio: regular rate and rhythm, S1, S2 normal, no murmur, click, rub or gallop GI: soft, non-tender; bowel sounds normal; no masses,  no organomegaly Skin: Skin color, texture, turgor normal.  No rashes or lesions Lymph nodes: Cervical, supraclavicular, and axillary nodes normal. Neurologic: Grossly normal without any motor, sensory or deep tendon reflexes. Musculoskeletal: No joint deformity or effusion.    Assessment and Plan:   86 year old woman with:  1.  High-grade urothelial carcinoma of the right renal pelvis diagnosed in August 2021.  She was found to have 2 separate lesions measuring 4.8 and 1.6 cm involving into the muscularis.  She had 1 out of 2 lymph node biopsy involved indicating pathological staging of T3N2 disease after nephro ureterectomy.  The natural course of this disease and treatment options moving forward were discussed.  She had initial staging work-up of back in April 2021 which did not show any evidence of metastatic disease.  Updating her staging at this time would be essential to rule out any evidence of metastatic disease.  She has no evidence of metastatic disease, the role of adjuvant therapy could be considered.    Carboplatin with gemcitabine-based regimen would be reasonable to treat adjuvantly for a total of 4 cycles.  Complication associated with this therapy include nausea,  vomiting, myelosuppression, neutropenia and infusion related complications as well as renal insufficiency.   After discussion today she is agreeable with this plan.  She will require longer duration of therapy if she has advanced disease.  We will arrange for chemotherapy education class.     2.  IV access: Risks and benefits of using Port-A-Cath versus peripheral veins was discussed today.  Complication associated with Port-A-Cath insertion include bleeding, infection and thrombosis.  After discussing the risks and benefits, she is agreeable to proceed.   3.  Antiemetics: Prescription for Compazine was made available to him.   4.  Renal function surveillance: We will continue to monitor on cisplatin therapy.  Baseline kidney function is normal.   5.  Goals of care:  Therapy would be curative unless she has advanced disease.   6.  Follow-up: will be in the immediate future to start chemotherapy.  60  minutes were dedicated to this visit. The time was spent on reviewing laboratory data, imaging studies, discussing treatment options, discussing differential diagnosis and answering questions regarding future plan.    A copy of this consult has been forwarded to the requesting physician.

## 2020-07-01 ENCOUNTER — Telehealth: Payer: Self-pay | Admitting: Oncology

## 2020-07-01 NOTE — Telephone Encounter (Signed)
Scheduled per 10/01 los, patient has been called and notified.  

## 2020-07-02 ENCOUNTER — Inpatient Hospital Stay: Payer: Medicare PPO

## 2020-07-02 ENCOUNTER — Other Ambulatory Visit: Payer: Self-pay

## 2020-07-03 ENCOUNTER — Other Ambulatory Visit: Payer: Self-pay | Admitting: Radiology

## 2020-07-04 ENCOUNTER — Other Ambulatory Visit: Payer: Medicare PPO

## 2020-07-04 DIAGNOSIS — C651 Malignant neoplasm of right renal pelvis: Secondary | ICD-10-CM | POA: Diagnosis not present

## 2020-07-07 ENCOUNTER — Other Ambulatory Visit: Payer: Self-pay

## 2020-07-07 ENCOUNTER — Ambulatory Visit (HOSPITAL_COMMUNITY)
Admission: RE | Admit: 2020-07-07 | Discharge: 2020-07-07 | Disposition: A | Discharge status: Home / Self Care | Payer: Medicare PPO | Source: Ambulatory Visit | Attending: Family Medicine | Admitting: Family Medicine

## 2020-07-07 ENCOUNTER — Telehealth: Payer: Self-pay

## 2020-07-07 ENCOUNTER — Encounter (HOSPITAL_COMMUNITY): Payer: Self-pay

## 2020-07-07 ENCOUNTER — Other Ambulatory Visit: Payer: Self-pay | Admitting: Oncology

## 2020-07-07 ENCOUNTER — Ambulatory Visit (HOSPITAL_COMMUNITY)
Admission: RE | Admit: 2020-07-07 | Discharge: 2020-07-07 | Disposition: A | Discharge status: Home / Self Care | Payer: Medicare PPO | Source: Ambulatory Visit | Attending: Oncology | Admitting: Oncology

## 2020-07-07 DIAGNOSIS — Z905 Acquired absence of kidney: Secondary | ICD-10-CM | POA: Diagnosis not present

## 2020-07-07 DIAGNOSIS — C641 Malignant neoplasm of right kidney, except renal pelvis: Secondary | ICD-10-CM | POA: Insufficient documentation

## 2020-07-07 DIAGNOSIS — Z79899 Other long term (current) drug therapy: Secondary | ICD-10-CM | POA: Diagnosis not present

## 2020-07-07 DIAGNOSIS — I10 Essential (primary) hypertension: Secondary | ICD-10-CM | POA: Diagnosis not present

## 2020-07-07 DIAGNOSIS — Z8551 Personal history of malignant neoplasm of bladder: Secondary | ICD-10-CM | POA: Diagnosis not present

## 2020-07-07 DIAGNOSIS — C7901 Secondary malignant neoplasm of right kidney and renal pelvis: Secondary | ICD-10-CM | POA: Diagnosis not present

## 2020-07-07 DIAGNOSIS — I447 Left bundle-branch block, unspecified: Secondary | ICD-10-CM | POA: Diagnosis not present

## 2020-07-07 DIAGNOSIS — Z452 Encounter for adjustment and management of vascular access device: Secondary | ICD-10-CM | POA: Diagnosis not present

## 2020-07-07 DIAGNOSIS — E7800 Pure hypercholesterolemia, unspecified: Secondary | ICD-10-CM | POA: Diagnosis not present

## 2020-07-07 DIAGNOSIS — Z87891 Personal history of nicotine dependence: Secondary | ICD-10-CM | POA: Insufficient documentation

## 2020-07-07 HISTORY — DX: Polyneuropathy, unspecified: G62.9

## 2020-07-07 HISTORY — PX: IR IMAGING GUIDED PORT INSERTION: IMG5740

## 2020-07-07 LAB — CBC WITH DIFFERENTIAL/PLATELET
Abs Immature Granulocytes: 0.03 10*3/uL (ref 0.00–0.07)
Basophils Absolute: 0.1 10*3/uL (ref 0.0–0.1)
Basophils Relative: 1 %
Eosinophils Absolute: 0.2 10*3/uL (ref 0.0–0.5)
Eosinophils Relative: 3 %
HCT: 42.4 % (ref 36.0–46.0)
Hemoglobin: 13.4 g/dL (ref 12.0–15.0)
Immature Granulocytes: 0 %
Lymphocytes Relative: 31 %
Lymphs Abs: 2.3 10*3/uL (ref 0.7–4.0)
MCH: 28.8 pg (ref 26.0–34.0)
MCHC: 31.6 g/dL (ref 30.0–36.0)
MCV: 91.2 fL (ref 80.0–100.0)
Monocytes Absolute: 0.6 10*3/uL (ref 0.1–1.0)
Monocytes Relative: 8 %
Neutro Abs: 4.3 10*3/uL (ref 1.7–7.7)
Neutrophils Relative %: 57 %
Platelets: 277 10*3/uL (ref 150–400)
RBC: 4.65 MIL/uL (ref 3.87–5.11)
RDW: 13.9 % (ref 11.5–15.5)
WBC: 7.5 10*3/uL (ref 4.0–10.5)
nRBC: 0 % (ref 0.0–0.2)

## 2020-07-07 LAB — PROTIME-INR
INR: 0.9 (ref 0.8–1.2)
Prothrombin Time: 12 seconds (ref 11.4–15.2)

## 2020-07-07 MED ORDER — CEFAZOLIN SODIUM-DEXTROSE 2-4 GM/100ML-% IV SOLN: 2.0000 g | INTRAVENOUS | Status: AC

## 2020-07-07 MED ORDER — LIDOCAINE-EPINEPHRINE 1 %-1:100000 IJ SOLN
INTRAMUSCULAR | Status: AC
  Filled 2020-07-07: qty 1

## 2020-07-07 MED ORDER — FENTANYL CITRATE (PF) 100 MCG/2ML IJ SOLN
INTRAMUSCULAR | Status: AC
  Filled 2020-07-07: qty 2

## 2020-07-07 MED ORDER — LIDOCAINE-EPINEPHRINE 1 %-1:100000 IJ SOLN
INTRAMUSCULAR | Status: AC | PRN
  Administered 2020-07-07: 20 mL

## 2020-07-07 MED ORDER — HEPARIN SOD (PORK) LOCK FLUSH 100 UNIT/ML IV SOLN
INTRAVENOUS | Status: AC | PRN
  Administered 2020-07-07: 500 [IU] via INTRAVENOUS

## 2020-07-07 MED ORDER — CEFAZOLIN SODIUM-DEXTROSE 2-4 GM/100ML-% IV SOLN
INTRAVENOUS | Status: AC
  Administered 2020-07-07: 2 g via INTRAVENOUS
  Filled 2020-07-07: qty 100

## 2020-07-07 MED ORDER — FENTANYL CITRATE (PF) 100 MCG/2ML IJ SOLN
INTRAMUSCULAR | Status: AC | PRN
  Administered 2020-07-07: 50 ug via INTRAVENOUS

## 2020-07-07 MED ORDER — SODIUM CHLORIDE 0.9 % IV SOLN: INTRAVENOUS | Status: DC

## 2020-07-07 MED ORDER — MIDAZOLAM HCL 2 MG/2ML IJ SOLN
INTRAMUSCULAR | Status: AC | PRN
  Administered 2020-07-07: 1 mg via INTRAVENOUS

## 2020-07-07 MED ORDER — HEPARIN SOD (PORK) LOCK FLUSH 100 UNIT/ML IV SOLN
INTRAVENOUS | Status: AC
  Filled 2020-07-07: qty 5

## 2020-07-07 MED ORDER — MIDAZOLAM HCL 2 MG/2ML IJ SOLN
INTRAMUSCULAR | Status: AC
  Filled 2020-07-07: qty 2

## 2020-07-07 NOTE — Telephone Encounter (Signed)
Called patient and advised her per Dr Alen Blew that will call her after her scan has been read and he has went over it. Patient verbalized understanding.

## 2020-07-07 NOTE — Telephone Encounter (Signed)
-----   Message from Wyatt Portela, MD sent at 07/07/2020  8:43 AM EDT ----- I will address it with her via phone on Thursday or Friday. Thanks ----- Message ----- From: Kennedy Bucker, LPN Sent: 06/25/5746   8:33 AM EDT To: Wyatt Portela, MD  Please advise  Thank you  Kim LPN ----- Message ----- From: Tami Lin, RN Sent: 07/07/2020   8:12 AM EDT To: Kennedy Bucker, LPN   ----- Message ----- From: Lin Givens, RN Sent: 07/02/2020   4:41 PM EDT To: Tami Lin, RN  Mrs. Pine starts treatment next Friday, she is having PET scan on Thursday and would like for Dr Alen Blew to review it with her before she starts treatment.  She does not have an appt with him on treatment day, will he go and see her in the infusion room?

## 2020-07-07 NOTE — Consult Note (Signed)
Chief Complaint: Patient was seen in consultation today for Port-A-Cath placement  Referring Physician(s): Shadad,Firas N  Supervising Physician: Sandi Mariscal  Patient Status: [facility] - Out-pt  History of Present Illness: Kayla Bowers is an 86 y.o. female with history of bladder cancer in 2018 and most recently high-grade urothelial carcinoma involving the right renal pelvis in August of this year, status post right nephroureterectomy and right adrenalectomy on 05/21/2020.  She presents today for Port-A-Cath placement for upcoming chemotherapy.  Past Medical History:  Diagnosis Date  . Bladder cancer ([place])   . Bundle branch block    left  . Closed wedge compression fracture of T2 vertebra ([place]) 07/09/2019  . History of kidney stones   . Hypercholesteremia   . Hypertension   . Kidney stone   . Osteoporosis     Past Surgical History:  Procedure Laterality Date  . ABDOMINAL HYSTERECTOMY    . APPENDECTOMY    . BLADDER SURGERY  01/2017   removal of a tumor  . CATARACT EXTRACTION    . COLON SURGERY     took out 12 inches  . COLONOSCOPY  11/19/2009   Mild pancolonic diverticulitis. Status post sigmoid resection. Small internal hemorrhoid. Otherwies normal colonsocopy.   . CYSTOSCOPY WITH URETEROSCOPY AND STENT PLACEMENT Right 01/30/2020   Procedure: CYSTOSCOPY WITH RIGHT URETEROSCOPY/BIOPSY;  Surgeon: Ceasar Mons, MD;  Location: [facility];  Service: Urology;  Laterality: Right;  . EYE SURGERY Bilateral 1990   Implant  . ROBOT ASSITED LAPAROSCOPIC NEPHROURETERECTOMY Right 05/21/2020   Procedure: XI ROBOT ASSITED LAPAROSCOPIC [place];  Surgeon: Ceasar Mons, MD;  Location: [facility];  Service: Urology;  Laterality: Right;  . Tummy Tuck    . Vericose Vein Stripping      Allergies: Flomax [tamsulosin hcl]  Medications: Prior to Admission medications   Medication Sig Start Date End Date Taking? Authorizing  Provider  docusate sodium (COLACE) 100 MG capsule Take 1 capsule (100 mg total) by mouth 2 (two) times daily. Patient not taking: Reported on 06/27/2020 05/21/20   Debbrah Alar, PA-C  fenofibrate 160 MG tablet Take 160 mg by mouth at bedtime. 03/06/20   [provider]  hydrochlorothiazide (MICROZIDE) 12.5 MG capsule Take 12.5 mg by mouth daily.     [provider]  HYDROcodone-acetaminophen (NORCO) 5-325 MG tablet Take 1-2 tablets by mouth every 6 (six) hours as needed for moderate pain. Patient not taking: Reported on 06/27/2020 05/21/20   Debbrah Alar, PA-C  lidocaine-prilocaine (EMLA) cream Apply 1 application topically as needed. 06/27/20   Wyatt Portela, MD  prochlorperazine (COMPAZINE) 10 MG tablet Take 1 tablet (10 mg total) by mouth every 6 (six) hours as needed for nausea or vomiting. 06/27/20   Wyatt Portela, MD  promethazine (PHENERGAN) 12.5 MG tablet Take 1 tablet (12.5 mg total) by mouth every 4 (four) hours as needed for nausea or vomiting. Patient not taking: Reported on 06/27/2020 05/21/20   Debbrah Alar, PA-C     Family History  Problem Relation Age of Onset  . Colon cancer Neg Hx   . Esophageal cancer Neg Hx     Social History   Socioeconomic History  . Marital status: Married    Spouse name: Not on file  . Number of children: 2  . Years of education: Not on file  . Highest education level: Not on file  Occupational History  . Not on file  Tobacco Use  . Smoking status: Former Research scientist (life sciences)  . Smokeless  tobacco: Never Used  Vaping Use  . Vaping Use: Never used  Substance and Sexual Activity  . Alcohol use: Not Currently    Comment: quit 2 years ago   . Drug use: Never  . Sexual activity: Not Currently    Birth control/protection: Post-menopausal  Other Topics Concern  . Not on file  Social History Narrative  . Not on file   Social Determinants of Health   Financial Resource Strain:   . Difficulty of Paying Living Expenses: Not on file  Food  Insecurity:   . Worried About Charity fundraiser in the Last Year: Not on file  . Ran Out of Food in the Last Year: Not on file  Transportation Needs:   . Lack of Transportation (Medical): Not on file  . Lack of Transportation (Non-Medical): Not on file  Physical Activity:   . Days of Exercise per Week: Not on file  . Minutes of Exercise per Session: Not on file  Stress:   . Feeling of Stress : Not on file  Social Connections:   . Frequency of Communication with Friends and Family: Not on file  . Frequency of Social Gatherings with Friends and Family: Not on file  . Attends Religious Services: Not on file  . Active Member of Clubs or Organizations: Not on file  . Attends Archivist Meetings: Not on file  . Marital Status: Not on file      Review of Systems currently denies fever, headache, chest pain, dyspnea, cough, back pain, nausea, vomiting or bleeding.  She does have some abdominal discomfort and lower extremity neuropathy  Vital Signs: BP (!) 179/69   Pulse 73   Temp 98.1 F (36.7 C) (Oral)   Resp 16   SpO2 99%   Physical Exam awake, alert.  Chest clear to auscultation bilaterally.  Heart with regular rate and rhythm.  Abdomen soft, positive bowel sounds, some mild diffuse tenderness to palpation.  Trace pretibial edema bilaterally.  Imaging: No results found.  Labs:  CBC: Recent Labs    04/14/20 1140 04/14/20 1140 05/09/20 1345 05/21/20 1733 05/22/20 0523 05/23/20 0516  WBC 6.9  --  6.4 18.3*  --   --   HGB 13.8   < > 13.2 13.4  13.5 12.5 12.0  HCT 44.0   < > 41.9 42.1  41.5 39.0 37.4  PLT 314  --  312 301  --   --    < > = values in this interval not displayed.    COAGS: No results for input(s): INR, APTT in the last 8760 hours.  BMP: Recent Labs    04/14/20 1140 04/14/20 1140 05/09/20 1345 05/21/20 1733 05/22/20 0523 05/23/20 0516  NA 138  --  137  --  132* 135  K 5.3*  --  4.4  --  3.9 4.0  CL 99  --  98  --  97* 100  CO2 29   --  31  --  26 27  GLUCOSE 107*  --  89  --  148* 97  BUN 21  --  24*  --  13 20  CALCIUM 10.7*  --  10.1  --  9.3 9.7  CREATININE 0.95   < > 0.96 1.08* 1.03* 1.07*  GFRNONAA 53*   < > 53* 45* 48* 46*  GFRAA >60   < > >60 53* 56* 53*   < > = values in this interval not displayed.    LIVER FUNCTION TESTS:  No results for input(s): BILITOT, AST, ALT, ALKPHOS, PROT, ALBUMIN in the last 8760 hours.  TUMOR MARKERS: No results for input(s): AFPTM, CEA, CA199, CHROMGRNA in the last 8760 hours.  Assessment and Plan: 86 y.o. female with history of bladder cancer in 2018 and most recently high-grade urothelial carcinoma involving the right renal pelvis in August of this year, status post right nephroureterectomy and right adrenalectomy on 05/21/2020.  She presents today for Port-A-Cath placement for upcoming chemotherapy.Risks and benefits of image guided port-a-catheter placement was discussed with the patient including, but not limited to bleeding, infection, pneumothorax, or fibrin sheath development and need for additional procedures.  All of the patient's questions were answered, patient is agreeable to proceed. Consent signed and in chart.      Thank you for this interesting consult.  I greatly enjoyed meeting Kayla Bowers and look forward to participating in their care.  A copy of this report was sent to the requesting provider on this date.  Electronically Signed: D. Rowe Robert, PA-C 07/07/2020, 1:18 PM   I spent a total of 25 minutes    in face to face in clinical consultation, greater than 50% of which was counseling/coordinating care for Port-A-Cath placement

## 2020-07-07 NOTE — Discharge Instructions (Signed)
For questions /concerns may call Interventional Radiology at [phone]  You may remove your dressing 24 to 48 hours post procedure.  Do not submerge in tub or pool until site is healing well.  DO NOT use EMLA cream for 2 weeks after port placement as the cream will remove surgical glue on your incision.    Your provider should set up appointments for your port to be flushed on a monthly basis.   Implanted Port Insertion, Care After This sheet gives you information about how to care for yourself after your procedure. Your health care provider may also give you more specific instructions. If you have problems or questions, contact your health care provider. What can I expect after the procedure? After the procedure, it is common to have:  Discomfort at the port insertion site.  Bruising on the skin over the port. This should improve over 3-4 days. Follow these instructions at home: [facility] care  After your port is placed, you will get a manufacturer's information card. The card has information about your port. Keep this card with you at all times.  Take care of the port as told by your health care provider. Ask your health care provider if you or a family member can get training for taking care of the port at home. A home health care nurse may also take care of the port.  Make sure to remember what type of port you have. Incision care Follow instructions from your health care provider about how to take care of your port insertion site. Make sure you: ? Wash your hands with soap and water before and after you change your bandage (dressing). If soap and water are not available, use hand sanitizer. ? Change your dressing as told by your health care provider. ? Leave stitches (sutures), skin glue, or adhesive strips in place. These skin closures may need to stay in place for 2 weeks or longer. If adhesive strip edges start to loosen and curl up, you may trim the loose edges. Do not remove  adhesive strips completely unless your health care provider tells you to do that.  Check your port insertion site every day for signs of infection. Check for: ? Redness, swelling, or pain. ? Fluid or blood. ? Warmth. ? Pus or a bad smell. Activity  Return to your normal activities as told by your health care provider. Ask your health care provider what activities are safe for you.  Do not lift anything that is heavier than 10 lb (4.5 kg), or the limit that you are told, until your health care provider says that it is safe. General instructions  Take over-the-counter and prescription medicines only as told by your health care provider.  Do not take baths, swim, or use a hot tub until your health care provider approves. Ask your health care provider if you may take showers. You may only be allowed to take sponge baths.  Do not drive for 24 hours if you were given a sedative during your procedure.  Wear a medical alert bracelet in case of an emergency. This will tell any health care providers that you have a port.  Keep all follow-up visits as told by your health care provider. This is important. Contact a health care provider if:  You cannot flush your port with saline as directed, or you cannot draw blood from the port.  You have a fever or chills.  You have redness, swelling, or pain around your port insertion site.  You have fluid or blood coming from your port insertion site.  Your port insertion site feels warm to the touch.  You have pus or a bad smell coming from the port insertion site. Get help right away if:  You have chest pain or shortness of breath.  You have bleeding from your port that you cannot control. Summary  Take care of the port as told by your health care provider. Keep the manufacturer's information card with you at all times.  Change your dressing as told by your health care provider.  Contact a health care provider if you have a fever or chills  or if you have redness, swelling, or pain around your port insertion site.  Keep all follow-up visits as told by your health care provider. This information is not intended to replace advice given to you by your health care provider. Make sure you discuss any questions you have with your health care provider. Document Revised: 04/11/2018 Document Reviewed: 04/11/2018 Elsevier Patient Education  [place]. Moderate Conscious Sedation, Adult, Care After These instructions provide you with information about caring for yourself after your procedure. Your health care provider may also give you more specific instructions. Your treatment has been planned according to current medical practices, but problems sometimes occur. Call your health care provider if you have any problems or questions after your procedure. What can I expect after the procedure? After your procedure, it is common:  To feel sleepy for several hours.  To feel clumsy and have poor balance for several hours.  To have poor judgment for several hours.  To vomit if you eat too soon. Follow these instructions at home: For at least 24 hours after the procedure:  Do not: ? Participate in activities where you could fall or become injured. ? Drive. ? Use heavy machinery. ? Drink alcohol. ? Take sleeping pills or medicines that cause drowsiness. ? Make important decisions or sign legal documents. ? Take care of children on your own.  Rest. Eating and drinking  Follow the diet recommended by your health care provider.  If you vomit: ? Drink water, juice, or soup when you can drink without vomiting. ? Make sure you have little or no nausea before eating solid foods. General instructions  Have a responsible adult stay with you until you are awake and alert.  Take over-the-counter and prescription medicines only as told by your health care provider.  If you smoke, do not smoke without supervision.  Keep all  follow-up visits as told by your health care provider. This is important. Contact a health care provider if:  You keep feeling nauseous or you keep vomiting.  You feel light-headed.  You develop a rash.  You have a fever. Get help right away if:  You have trouble breathing. This information is not intended to replace advice given to you by your health care provider. Make sure you discuss any questions you have with your health care provider. Document Revised: 08/26/2017 Document Reviewed: 01/03/2016 Elsevier Patient Education  2020 [employer].

## 2020-07-07 NOTE — Progress Notes (Signed)
Pharmacist Chemotherapy Monitoring - Initial Assessment    Anticipated start date: 07/11/2020   Regimen:  . Are orders appropriate based on the patient's diagnosis, regimen, and cycle? Yes . Does the plan date match the patient's scheduled date? Yes . Is the sequencing of drugs appropriate? Yes . Are the premedications appropriate for the patient's regimen? Yes . Prior Authorization for treatment is: Not Started o If applicable, is the correct biosimilar selected based on the patient's insurance? not applicable  Organ Function and Labs: Marland Kitchen Are dose adjustments needed based on the patient's renal function, hepatic function, or hematologic function? Yes . Are appropriate labs ordered prior to the start of patient's treatment? Yes . Other organ system assessment, if indicated: N/A . The following baseline labs, if indicated, have been ordered: N/A  Dose Assessment: . Are the drug doses appropriate? Yes . Are the following correct: o Drug concentrations Yes o IV fluid compatible with drug Yes o Administration routes Yes o Timing of therapy Yes . If applicable, does the patient have documented access for treatment and/or plans for port-a-cath placement? yes . If applicable, have lifetime cumulative doses been properly documented and assessed? yes Lifetime Dose Tracking  No doses have been documented on this patient for the following tracked chemicals: Doxorubicin, Epirubicin, Idarubicin, Daunorubicin, Mitoxantrone, Bleomycin, Oxaliplatin, Carboplatin, Liposomal Doxorubicin  o   Toxicity Monitoring/Prevention: . The patient has the following take home antiemetics prescribed: Prochlorperazine . The patient has the following take home medications prescribed: N/A . Medication allergies and previous infusion related reactions, if applicable, have been reviewed and addressed. No . The patient's current medication list has been assessed for drug-drug interactions with their chemotherapy  regimen. no significant drug-drug interactions were identified on review.  Order Review: . Are the treatment plan orders signed? Yes . Is the patient scheduled to see a provider prior to their treatment? No  I verify that I have reviewed each item in the above checklist and answered each question accordingly.  Navreet Bolda D 07/07/2020 10:24 AM

## 2020-07-07 NOTE — Procedures (Signed)
Pre Procedure Dx: Poor venous access Post Procedural Dx: Same  Successful placement of right IJ approach port-a-cath with tip at the superior caval atrial junction. The catheter is ready for immediate use.  Estimated Blood Loss: Minimal  Complications: None immediate.  Jay Kaelani Kendrick, MD Pager #: [phone]   

## 2020-07-10 ENCOUNTER — Other Ambulatory Visit: Payer: Self-pay

## 2020-07-10 ENCOUNTER — Ambulatory Visit (HOSPITAL_COMMUNITY)
Admission: RE | Admit: 2020-07-10 | Discharge: 2020-07-10 | Disposition: A | Discharge status: Home / Self Care | Payer: Medicare PPO | Source: Ambulatory Visit | Attending: Oncology | Admitting: Oncology

## 2020-07-10 DIAGNOSIS — C641 Malignant neoplasm of right kidney, except renal pelvis: Secondary | ICD-10-CM | POA: Diagnosis not present

## 2020-07-10 DIAGNOSIS — I700 Atherosclerosis of aorta: Secondary | ICD-10-CM | POA: Diagnosis not present

## 2020-07-10 DIAGNOSIS — K449 Diaphragmatic hernia without obstruction or gangrene: Secondary | ICD-10-CM | POA: Diagnosis not present

## 2020-07-10 DIAGNOSIS — R918 Other nonspecific abnormal finding of lung field: Secondary | ICD-10-CM | POA: Insufficient documentation

## 2020-07-10 DIAGNOSIS — M47819 Spondylosis without myelopathy or radiculopathy, site unspecified: Secondary | ICD-10-CM | POA: Insufficient documentation

## 2020-07-10 DIAGNOSIS — I517 Cardiomegaly: Secondary | ICD-10-CM | POA: Insufficient documentation

## 2020-07-10 DIAGNOSIS — I2510 Atherosclerotic heart disease of native coronary artery without angina pectoris: Secondary | ICD-10-CM | POA: Diagnosis not present

## 2020-07-10 LAB — GLUCOSE, CAPILLARY: Glucose-Capillary: 95 mg/dL (ref 70–99)

## 2020-07-10 MED ORDER — FLUDEOXYGLUCOSE F - 18 (FDG) INJECTION
7.0300 | Freq: Once | INTRAVENOUS | Status: AC | PRN
  Administered 2020-07-10: 7.03 via INTRAVENOUS

## 2020-07-11 ENCOUNTER — Inpatient Hospital Stay: Payer: Medicare PPO

## 2020-07-11 ENCOUNTER — Other Ambulatory Visit: Payer: Self-pay

## 2020-07-11 VITALS — BP 179/75 | HR 75 | Temp 98.2°F | Resp 16

## 2020-07-11 DIAGNOSIS — Z95828 Presence of other vascular implants and grafts: Secondary | ICD-10-CM

## 2020-07-11 DIAGNOSIS — C779 Secondary and unspecified malignant neoplasm of lymph node, unspecified: Secondary | ICD-10-CM | POA: Diagnosis not present

## 2020-07-11 DIAGNOSIS — Z5111 Encounter for antineoplastic chemotherapy: Secondary | ICD-10-CM | POA: Diagnosis not present

## 2020-07-11 DIAGNOSIS — C651 Malignant neoplasm of right renal pelvis: Secondary | ICD-10-CM | POA: Diagnosis not present

## 2020-07-11 DIAGNOSIS — C649 Malignant neoplasm of unspecified kidney, except renal pelvis: Secondary | ICD-10-CM

## 2020-07-11 DIAGNOSIS — Z23 Encounter for immunization: Secondary | ICD-10-CM | POA: Diagnosis not present

## 2020-07-11 DIAGNOSIS — Z79899 Other long term (current) drug therapy: Secondary | ICD-10-CM | POA: Diagnosis not present

## 2020-07-11 DIAGNOSIS — C641 Malignant neoplasm of right kidney, except renal pelvis: Secondary | ICD-10-CM

## 2020-07-11 LAB — CBC WITH DIFFERENTIAL (CANCER CENTER ONLY)
Abs Immature Granulocytes: 0.01 10*3/uL (ref 0.00–0.07)
Basophils Absolute: 0.1 10*3/uL (ref 0.0–0.1)
Basophils Relative: 1 %
Eosinophils Absolute: 0.2 10*3/uL (ref 0.0–0.5)
Eosinophils Relative: 3 %
HCT: 38.5 % (ref 36.0–46.0)
Hemoglobin: 12.3 g/dL (ref 12.0–15.0)
Immature Granulocytes: 0 %
Lymphocytes Relative: 35 %
Lymphs Abs: 2 10*3/uL (ref 0.7–4.0)
MCH: 28.7 pg (ref 26.0–34.0)
MCHC: 31.9 g/dL (ref 30.0–36.0)
MCV: 89.7 fL (ref 80.0–100.0)
Monocytes Absolute: 0.5 10*3/uL (ref 0.1–1.0)
Monocytes Relative: 9 %
Neutro Abs: 3 10*3/uL (ref 1.7–7.7)
Neutrophils Relative %: 52 %
Platelet Count: 274 10*3/uL (ref 150–400)
RBC: 4.29 MIL/uL (ref 3.87–5.11)
RDW: 13.7 % (ref 11.5–15.5)
WBC Count: 5.7 10*3/uL (ref 4.0–10.5)
nRBC: 0 % (ref 0.0–0.2)

## 2020-07-11 LAB — CMP (CANCER CENTER ONLY)
ALT: 8 U/L (ref 0–44)
AST: 17 U/L (ref 15–41)
Albumin: 3.9 g/dL (ref 3.5–5.0)
Alkaline Phosphatase: 51 U/L (ref 38–126)
Anion gap: 3 — ABNORMAL LOW (ref 5–15)
BUN: 26 mg/dL — ABNORMAL HIGH (ref 8–23)
CO2: 31 mmol/L (ref 22–32)
Calcium: 10.3 mg/dL (ref 8.9–10.3)
Chloride: 100 mmol/L (ref 98–111)
Creatinine: 1.31 mg/dL — ABNORMAL HIGH (ref 0.44–1.00)
GFR, Estimated: 36 mL/min — ABNORMAL LOW (ref >60)
Glucose, Bld: 85 mg/dL (ref 70–99)
Potassium: 4.3 mmol/L (ref 3.5–5.1)
Sodium: 134 mmol/L — ABNORMAL LOW (ref 135–145)
Total Bilirubin: 0.4 mg/dL (ref 0.3–1.2)
Total Protein: 6.8 g/dL (ref 6.5–8.1)

## 2020-07-11 MED ORDER — SODIUM CHLORIDE 0.9 % IV SOLN
1000.0000 mg/m2 | Freq: Once | INTRAVENOUS | Status: AC
  Administered 2020-07-11: 1748 mg via INTRAVENOUS
  Filled 2020-07-11: qty 45.97

## 2020-07-11 MED ORDER — SODIUM CHLORIDE 0.9% FLUSH
10.0000 mL | Freq: Once | INTRAVENOUS | Status: AC
  Administered 2020-07-11: 10 mL
  Filled 2020-07-11: qty 10

## 2020-07-11 MED ORDER — SODIUM CHLORIDE 0.9 % IV SOLN
Freq: Once | INTRAVENOUS | Status: AC
  Filled 2020-07-11: qty 250

## 2020-07-11 MED ORDER — PALONOSETRON HCL INJECTION 0.25 MG/5ML
INTRAVENOUS | Status: AC
  Filled 2020-07-11: qty 5

## 2020-07-11 MED ORDER — SODIUM CHLORIDE 0.9 % IV SOLN
10.0000 mg | Freq: Once | INTRAVENOUS | Status: AC
  Administered 2020-07-11: 10 mg via INTRAVENOUS
  Filled 2020-07-11: qty 10

## 2020-07-11 MED ORDER — SODIUM CHLORIDE 0.9% FLUSH
10.0000 mL | INTRAVENOUS | Status: DC | PRN
  Administered 2020-07-11: 10 mL
  Filled 2020-07-11: qty 10

## 2020-07-11 MED ORDER — SODIUM CHLORIDE 0.9 % IV SOLN
150.0000 mg | Freq: Once | INTRAVENOUS | Status: AC
  Administered 2020-07-11: 150 mg via INTRAVENOUS
  Filled 2020-07-11: qty 150

## 2020-07-11 MED ORDER — SODIUM CHLORIDE 0.9 % IV SOLN
280.0000 mg | Freq: Once | INTRAVENOUS | Status: AC
  Administered 2020-07-11: 280 mg via INTRAVENOUS
  Filled 2020-07-11: qty 28

## 2020-07-11 MED ORDER — PALONOSETRON HCL INJECTION 0.25 MG/5ML
0.2500 mg | Freq: Once | INTRAVENOUS | Status: AC
  Administered 2020-07-11: 0.25 mg via INTRAVENOUS

## 2020-07-11 MED ORDER — HEPARIN SOD (PORK) LOCK FLUSH 100 UNIT/ML IV SOLN
500.0000 [IU] | Freq: Once | INTRAVENOUS | Status: AC | PRN
  Administered 2020-07-11: 500 [IU]
  Filled 2020-07-11: qty 5

## 2020-07-11 NOTE — Patient Instructions (Signed)
[place] Discharge Instructions for Patients Receiving Chemotherapy  Today you received the following chemotherapy agents gemzar/carboplatin   To help prevent nausea and vomiting after your treatment, we encourage you to take your nausea medication as directed   If you develop nausea and vomiting that is not controlled by your nausea medication, call the clinic.   BELOW ARE SYMPTOMS THAT SHOULD BE REPORTED IMMEDIATELY:  *FEVER GREATER THAN 100.5 F  *CHILLS WITH OR WITHOUT FEVER  NAUSEA AND VOMITING THAT IS NOT CONTROLLED WITH YOUR NAUSEA MEDICATION  *UNUSUAL SHORTNESS OF BREATH  *UNUSUAL BRUISING OR BLEEDING  TENDERNESS IN MOUTH AND THROAT WITH OR WITHOUT PRESENCE OF ULCERS  *URINARY PROBLEMS  *BOWEL PROBLEMS  UNUSUAL RASH Items with * indicate a potential emergency and should be followed up as soon as possible.  Feel free to call the clinic you have any questions or concerns. The clinic phone number is [phone].  Gemcitabine injection What is this medicine? GEMCITABINE (jem SYE ta been) is a chemotherapy drug. This medicine is used to treat many types of cancer like breast cancer, lung cancer, pancreatic cancer, and ovarian cancer. This medicine may be used for other purposes; ask your health care provider or pharmacist if you have questions. COMMON BRAND NAME(S): Gemzar, Infugem What should I tell my health care provider before I take this medicine? They need to know if you have any of these conditions:  blood disorders  infection  kidney disease  liver disease  lung or breathing disease, like asthma  recent or ongoing radiation therapy  an unusual or allergic reaction to gemcitabine, other chemotherapy, other medicines, foods, dyes, or preservatives  pregnant or trying to get pregnant  breast-feeding How should I use this medicine? This drug is given as an infusion into a vein. It is administered in a hospital or clinic by a  specially trained health care professional. Talk to your pediatrician regarding the use of this medicine in children. Special care may be needed. Overdosage: If you think you have taken too much of this medicine contact a poison control center or emergency room at once. NOTE: This medicine is only for you. Do not share this medicine with others. What if I miss a dose? It is important not to miss your dose. Call your doctor or health care professional if you are unable to keep an appointment. What may interact with this medicine?  medicines to increase blood counts like filgrastim, pegfilgrastim, sargramostim  some other chemotherapy drugs like cisplatin  vaccines Talk to your doctor or health care professional before taking any of these medicines:  acetaminophen  aspirin  ibuprofen  ketoprofen  naproxen This list may not describe all possible interactions. Give your health care provider a list of all the medicines, herbs, non-prescription drugs, or dietary supplements you use. Also tell them if you smoke, drink alcohol, or use illegal drugs. Some items may interact with your medicine. What should I watch for while using this medicine? Visit your doctor for checks on your progress. This drug may make you feel generally unwell. This is not uncommon, as chemotherapy can affect healthy cells as well as cancer cells. Report any side effects. Continue your course of treatment even though you feel ill unless your doctor tells you to stop. In some cases, you may be given additional medicines to help with side effects. Follow all directions for their use. Call your doctor or health care professional for advice if you get a fever, chills or sore  throat, or other symptoms of a cold or flu. Do not treat yourself. This drug decreases your body's ability to fight infections. Try to avoid being around people who are sick. This medicine may increase your risk to bruise or bleed. Call your doctor or  health care professional if you notice any unusual bleeding. Be careful brushing and flossing your teeth or using a toothpick because you may get an infection or bleed more easily. If you have any dental work done, tell your dentist you are receiving this medicine. Avoid taking products that contain aspirin, acetaminophen, ibuprofen, naproxen, or ketoprofen unless instructed by your doctor. These medicines may hide a fever. Do not become pregnant while taking this medicine or for 6 months after stopping it. Women should inform their doctor if they wish to become pregnant or think they might be pregnant. Men should not father a child while taking this medicine and for 3 months after stopping it. There is a potential for serious side effects to an unborn child. Talk to your health care professional or pharmacist for more information. Do not breast-feed an infant while taking this medicine or for at least 1 week after stopping it. Men should inform their doctors if they wish to father a child. This medicine may lower sperm counts. Talk with your doctor or health care professional if you are concerned about your fertility. What side effects may I notice from receiving this medicine? Side effects that you should report to your doctor or health care professional as soon as possible:  allergic reactions like skin rash, itching or hives, swelling of the face, lips, or tongue  breathing problems  pain, redness, or irritation at site where injected  signs and symptoms of a dangerous change in heartbeat or heart rhythm like chest pain; dizziness; fast or irregular heartbeat; palpitations; feeling faint or lightheaded, falls; breathing problems  signs of decreased platelets or bleeding - bruising, pinpoint red spots on the skin, black, tarry stools, blood in the urine  signs of decreased red blood cells - unusually weak or tired, feeling faint or lightheaded, falls  signs of infection - fever or chills,  cough, sore throat, pain or difficulty passing urine  signs and symptoms of kidney injury like trouble passing urine or change in the amount of urine  signs and symptoms of liver injury like dark yellow or brown urine; general ill feeling or flu-like symptoms; light-colored stools; loss of appetite; nausea; right upper belly pain; unusually weak or tired; yellowing of the eyes or skin  swelling of ankles, feet, hands Side effects that usually do not require medical attention (report to your doctor or health care professional if they continue or are bothersome):  constipation  diarrhea  hair loss  loss of appetite  nausea  rash  vomiting This list may not describe all possible side effects. Call your doctor for medical advice about side effects. You may report side effects to FDA at 1-800-FDA-1088. Where should I keep my medicine? This drug is given in a hospital or clinic and will not be stored at home. NOTE: This sheet is a summary. It may not cover all possible information. If you have questions about this medicine, talk to your doctor, pharmacist, or health care provider.  2020 Elsevier/Gold Standard (2017-12-07 18:06:11)  Carboplatin injection What is this medicine? CARBOPLATIN (KAR boe pla tin) is a chemotherapy drug. It targets fast dividing cells, like cancer cells, and causes these cells to die. This medicine is used to treat ovarian  cancer and many other cancers. This medicine may be used for other purposes; ask your health care provider or pharmacist if you have questions. COMMON BRAND NAME(S): Paraplatin What should I tell my health care provider before I take this medicine? They need to know if you have any of these conditions:  blood disorders  hearing problems  kidney disease  recent or ongoing radiation therapy  an unusual or allergic reaction to carboplatin, cisplatin, other chemotherapy, other medicines, foods, dyes, or preservatives  pregnant or trying  to get pregnant  breast-feeding How should I use this medicine? This drug is usually given as an infusion into a vein. It is administered in a hospital or clinic by a specially trained health care professional. Talk to your pediatrician regarding the use of this medicine in children. Special care may be needed. Overdosage: If you think you have taken too much of this medicine contact a poison control center or emergency room at once. NOTE: This medicine is only for you. Do not share this medicine with others. What if I miss a dose? It is important not to miss a dose. Call your doctor or health care professional if you are unable to keep an appointment. What may interact with this medicine?  medicines for seizures  medicines to increase blood counts like filgrastim, pegfilgrastim, sargramostim  some antibiotics like amikacin, gentamicin, neomycin, streptomycin, tobramycin  vaccines Talk to your doctor or health care professional before taking any of these medicines:  acetaminophen  aspirin  ibuprofen  ketoprofen  naproxen This list may not describe all possible interactions. Give your health care provider a list of all the medicines, herbs, non-prescription drugs, or dietary supplements you use. Also tell them if you smoke, drink alcohol, or use illegal drugs. Some items may interact with your medicine. What should I watch for while using this medicine? Your condition will be monitored carefully while you are receiving this medicine. You will need important blood work done while you are taking this medicine. This drug may make you feel generally unwell. This is not uncommon, as chemotherapy can affect healthy cells as well as cancer cells. Report any side effects. Continue your course of treatment even though you feel ill unless your doctor tells you to stop. In some cases, you may be given additional medicines to help with side effects. Follow all directions for their use. Call  your doctor or health care professional for advice if you get a fever, chills or sore throat, or other symptoms of a cold or flu. Do not treat yourself. This drug decreases your body's ability to fight infections. Try to avoid being around people who are sick. This medicine may increase your risk to bruise or bleed. Call your doctor or health care professional if you notice any unusual bleeding. Be careful brushing and flossing your teeth or using a toothpick because you may get an infection or bleed more easily. If you have any dental work done, tell your dentist you are receiving this medicine. Avoid taking products that contain aspirin, acetaminophen, ibuprofen, naproxen, or ketoprofen unless instructed by your doctor. These medicines may hide a fever. Do not become pregnant while taking this medicine. Women should inform their doctor if they wish to become pregnant or think they might be pregnant. There is a potential for serious side effects to an unborn child. Talk to your health care professional or pharmacist for more information. Do not breast-feed an infant while taking this medicine. What side effects may I notice  from receiving this medicine? Side effects that you should report to your doctor or health care professional as soon as possible:  allergic reactions like skin rash, itching or hives, swelling of the face, lips, or tongue  signs of infection - fever or chills, cough, sore throat, pain or difficulty passing urine  signs of decreased platelets or bleeding - bruising, pinpoint red spots on the skin, black, tarry stools, nosebleeds  signs of decreased red blood cells - unusually weak or tired, fainting spells, lightheadedness  breathing problems  changes in hearing  changes in vision  chest pain  high blood pressure  low blood counts - This drug may decrease the number of white blood cells, red blood cells and platelets. You may be at increased risk for infections and  bleeding.  nausea and vomiting  pain, swelling, redness or irritation at the injection site  pain, tingling, numbness in the hands or feet  problems with balance, talking, walking  trouble passing urine or change in the amount of urine Side effects that usually do not require medical attention (report to your doctor or health care professional if they continue or are bothersome):  hair loss  loss of appetite  metallic taste in the mouth or changes in taste This list may not describe all possible side effects. Call your doctor for medical advice about side effects. You may report side effects to FDA at 1-800-FDA-1088. Where should I keep my medicine? This drug is given in a hospital or clinic and will not be stored at home. NOTE: This sheet is a summary. It may not cover all possible information. If you have questions about this medicine, talk to your doctor, pharmacist, or health care provider.  2020 Elsevier/Gold Standard (2007-12-19 14:38:05)

## 2020-07-16 ENCOUNTER — Inpatient Hospital Stay: Payer: Medicare PPO | Admitting: Oncology

## 2020-07-16 ENCOUNTER — Other Ambulatory Visit: Payer: Self-pay

## 2020-07-16 ENCOUNTER — Inpatient Hospital Stay: Payer: Medicare PPO

## 2020-07-16 VITALS — BP 187/79 | HR 76 | Temp 97.1°F | Resp 18 | Ht 65.0 in | Wt 142.7 lb

## 2020-07-16 VITALS — BP 164/60 | HR 68 | Temp 98.9°F | Resp 20

## 2020-07-16 DIAGNOSIS — Z23 Encounter for immunization: Secondary | ICD-10-CM | POA: Diagnosis not present

## 2020-07-16 DIAGNOSIS — C649 Malignant neoplasm of unspecified kidney, except renal pelvis: Secondary | ICD-10-CM

## 2020-07-16 DIAGNOSIS — Z95828 Presence of other vascular implants and grafts: Secondary | ICD-10-CM

## 2020-07-16 DIAGNOSIS — C641 Malignant neoplasm of right kidney, except renal pelvis: Secondary | ICD-10-CM

## 2020-07-16 DIAGNOSIS — C779 Secondary and unspecified malignant neoplasm of lymph node, unspecified: Secondary | ICD-10-CM | POA: Diagnosis not present

## 2020-07-16 DIAGNOSIS — Z5111 Encounter for antineoplastic chemotherapy: Secondary | ICD-10-CM | POA: Diagnosis not present

## 2020-07-16 DIAGNOSIS — C651 Malignant neoplasm of right renal pelvis: Secondary | ICD-10-CM | POA: Diagnosis not present

## 2020-07-16 DIAGNOSIS — Z79899 Other long term (current) drug therapy: Secondary | ICD-10-CM | POA: Diagnosis not present

## 2020-07-16 LAB — CBC WITH DIFFERENTIAL (CANCER CENTER ONLY)
Abs Immature Granulocytes: 0.01 10*3/uL (ref 0.00–0.07)
Basophils Absolute: 0 10*3/uL (ref 0.0–0.1)
Basophils Relative: 1 %
Eosinophils Absolute: 0.2 10*3/uL (ref 0.0–0.5)
Eosinophils Relative: 5 %
HCT: 36.5 % (ref 36.0–46.0)
Hemoglobin: 11.9 g/dL — ABNORMAL LOW (ref 12.0–15.0)
Immature Granulocytes: 0 %
Lymphocytes Relative: 36 %
Lymphs Abs: 1.3 10*3/uL (ref 0.7–4.0)
MCH: 28.9 pg (ref 26.0–34.0)
MCHC: 32.6 g/dL (ref 30.0–36.0)
MCV: 88.6 fL (ref 80.0–100.0)
Monocytes Absolute: 0.1 10*3/uL (ref 0.1–1.0)
Monocytes Relative: 3 %
Neutro Abs: 2 10*3/uL (ref 1.7–7.7)
Neutrophils Relative %: 55 %
Platelet Count: 230 10*3/uL (ref 150–400)
RBC: 4.12 MIL/uL (ref 3.87–5.11)
RDW: 13.3 % (ref 11.5–15.5)
WBC Count: 3.5 10*3/uL — ABNORMAL LOW (ref 4.0–10.5)
nRBC: 0 % (ref 0.0–0.2)

## 2020-07-16 LAB — CMP (CANCER CENTER ONLY)
ALT: 18 U/L (ref 0–44)
AST: 27 U/L (ref 15–41)
Albumin: 3.8 g/dL (ref 3.5–5.0)
Alkaline Phosphatase: 52 U/L (ref 38–126)
Anion gap: 5 (ref 5–15)
BUN: 28 mg/dL — ABNORMAL HIGH (ref 8–23)
CO2: 30 mmol/L (ref 22–32)
Calcium: 10.2 mg/dL (ref 8.9–10.3)
Chloride: 98 mmol/L (ref 98–111)
Creatinine: 1.1 mg/dL — ABNORMAL HIGH (ref 0.44–1.00)
GFR, Estimated: 44 mL/min — ABNORMAL LOW (ref >60)
Glucose, Bld: 92 mg/dL (ref 70–99)
Potassium: 4.8 mmol/L (ref 3.5–5.1)
Sodium: 133 mmol/L — ABNORMAL LOW (ref 135–145)
Total Bilirubin: 0.4 mg/dL (ref 0.3–1.2)
Total Protein: 6.7 g/dL (ref 6.5–8.1)

## 2020-07-16 MED ORDER — SODIUM CHLORIDE 0.9% FLUSH
10.0000 mL | INTRAVENOUS | Status: DC | PRN
  Administered 2020-07-16: 10 mL
  Filled 2020-07-16: qty 10

## 2020-07-16 MED ORDER — HEPARIN SOD (PORK) LOCK FLUSH 100 UNIT/ML IV SOLN
500.0000 [IU] | Freq: Once | INTRAVENOUS | Status: AC | PRN
  Administered 2020-07-16: 500 [IU]
  Filled 2020-07-16: qty 5

## 2020-07-16 MED ORDER — INFLUENZA VAC A&B SA ADJ QUAD 0.5 ML IM PRSY
PREFILLED_SYRINGE | INTRAMUSCULAR | Status: AC
  Filled 2020-07-16: qty 0.5

## 2020-07-16 MED ORDER — SODIUM CHLORIDE 0.9 % IV SOLN
800.0000 mg/m2 | Freq: Once | INTRAVENOUS | Status: AC
  Administered 2020-07-16: 1368 mg via INTRAVENOUS
  Filled 2020-07-16: qty 35.98

## 2020-07-16 MED ORDER — PROCHLORPERAZINE MALEATE 10 MG PO TABS
10.0000 mg | ORAL_TABLET | Freq: Once | ORAL | Status: AC
  Administered 2020-07-16: 10 mg via ORAL

## 2020-07-16 MED ORDER — SODIUM CHLORIDE 0.9 % IV SOLN
Freq: Once | INTRAVENOUS | Status: AC
  Filled 2020-07-16: qty 250

## 2020-07-16 MED ORDER — PROCHLORPERAZINE MALEATE 10 MG PO TABS
ORAL_TABLET | ORAL | Status: AC
  Filled 2020-07-16: qty 1

## 2020-07-16 MED ORDER — INFLUENZA VAC A&B SA ADJ QUAD 0.5 ML IM PRSY
0.5000 mL | PREFILLED_SYRINGE | Freq: Once | INTRAMUSCULAR | Status: AC
  Administered 2020-07-16: 0.5 mL via INTRAMUSCULAR

## 2020-07-16 MED ORDER — SODIUM CHLORIDE 0.9% FLUSH
10.0000 mL | Freq: Once | INTRAVENOUS | Status: AC
  Administered 2020-07-16: 10 mL
  Filled 2020-07-16: qty 10

## 2020-07-16 NOTE — Progress Notes (Signed)
Hematology and Oncology Follow Up Visit  Kayla Bowers [id number] [birth year] 86 y.o. 07/16/2020 11:30 AM [name], MDHodges, [name], MD   Principle Diagnosis: 86 year old woman with right renal pelvis high-grade urothelial carcinoma diagnosed in 2021.  She was found to have T3N2 disease after nephro ureterectomy.   Prior Therapy:  He is status post robotic assisted right nephro ureterectomy and right adrenalectomy completed by Dr. Lovena Neighbours on May 21, 2020.  Current therapy: Adjuvant chemotherapy utilizing carboplatin and gemcitabine started on July 11, 2020.  Interim History: Kayla Bowers returns today for a follow-up visit.  Since last visit, she completed day 1 of cycle 1 of chemotherapy without any major complaints. He denies any nausea, vomiting or abdominal pain. She did report constipation however. She denies any shortness of breath or difficulty breathing. He denies any infusion related complaints.     Medications: I have reviewed the patient's current medications.  Current Outpatient Medications  Medication Sig Dispense Refill  . docusate sodium (COLACE) 100 MG capsule Take 1 capsule (100 mg total) by mouth 2 (two) times daily. (Patient not taking: Reported on 06/27/2020)    . fenofibrate 160 MG tablet Take 160 mg by mouth at bedtime.    . hydrochlorothiazide (MICROZIDE) 12.5 MG capsule Take 12.5 mg by mouth daily.     Marland Kitchen HYDROcodone-acetaminophen (NORCO) 5-325 MG tablet Take 1-2 tablets by mouth every 6 (six) hours as needed for moderate pain. (Patient not taking: Reported on 06/27/2020) 20 tablet 0  . lidocaine-prilocaine (EMLA) cream Apply 1 application topically as needed. 30 g 0  . Multiple Vitamins-Minerals (PRESERVISION AREDS PO) Take 1 capsule by mouth daily.    . prochlorperazine (COMPAZINE) 10 MG tablet Take 1 tablet (10 mg total) by mouth every 6 (six) hours as needed for nausea or vomiting. 30 tablet 0  . promethazine (PHENERGAN) 12.5 MG tablet Take 1 tablet  (12.5 mg total) by mouth every 4 (four) hours as needed for nausea or vomiting. (Patient not taking: Reported on 06/27/2020) 15 tablet 0   No current facility-administered medications for this visit.     Allergies:  Allergies  Allergen Reactions  . Flomax [Tamsulosin Hcl] Nausea Only     Physical Exam: Blood pressure (!) 187/79, pulse 76, temperature (!) 97.1 F (36.2 C), temperature source Tympanic, resp. rate 18, height 5\' 5"  (1.651 m), weight 142 lb 11.2 oz (64.7 kg), SpO2 100 %.   ECOG: 1   General appearance: Comfortable appearing without any discomfort Head: Normocephalic without any trauma Oropharynx: Mucous membranes are moist and pink without any thrush or ulcers. Eyes: Pupils are equal and round reactive to light. Lymph nodes: No cervical, supraclavicular, inguinal or axillary lymphadenopathy.   Heart:regular rate and rhythm.  S1 and S2 without leg edema. Lung: Clear without any rhonchi or wheezes.  No dullness to percussion. Abdomin: Soft, nontender, nondistended with good bowel sounds.  No hepatosplenomegaly. Musculoskeletal: No joint deformity or effusion.  Full range of motion noted. Neurological: No deficits noted on motor, sensory and deep tendon reflex exam. Skin: No petechial rash or dryness.  Appeared moist.      Lab Results: Lab Results  Component Value Date   WBC 5.7 07/11/2020   HGB 12.3 07/11/2020   HCT 38.5 07/11/2020   MCV 89.7 07/11/2020   PLT 274 07/11/2020     Chemistry      Component Value Date/Time   NA 134 (L) 07/11/2020 1246   K 4.3 07/11/2020 1246   CL 100 07/11/2020 1246  CO2 31 07/11/2020 1246   BUN 26 (H) 07/11/2020 1246   CREATININE 1.31 (H) 07/11/2020 1246      Component Value Date/Time   CALCIUM 10.3 07/11/2020 1246   ALKPHOS 51 07/11/2020 1246   AST 17 07/11/2020 1246   ALT 8 07/11/2020 1246   BILITOT 0.4 07/11/2020 1246       Radiological Studies: IMPRESSION: 1. No findings highly suspicious for hypermetabolic  metastatic disease. 2. Focal osseous hypermetabolism in the left L4 posterior elements at the site of prominent degenerative facet arthropathy, without discrete mass correlate on the CT images, indeterminate for bone metastasis, although degenerative uptake is favored. Consider short-term follow-up lumbar spine MRI with and without IV contrast versus attention on follow-up PET-CT. 3. Subcentimeter non-solid bilateral pulmonary nodules, below PET resolution, stable since 11/09/2019 chest CT. Continued chest CT surveillance suggested in 6-12 months. 4. Benign brown fat hypermetabolism throughout the neck and chest. 5. Chronic findings include: Aortic Atherosclerosis (ICD10-I70.0). Mild cardiomegaly. Coronary atherosclerosis. Small hiatal hernia.  Impression and Plan:   86 year old woman with:  1.    Renal pelvis high-grade urothelial carcinoma presented with T3N2 disease after nephroureterectomy in August 2021.  She is currently receiving adjuvant chemotherapy after imaging studies completed on May 10, 2020 showed no evidence of metastatic disease.  Risks and benefits of continuing this treatment were discussed today.  Complications that include myelosuppression, neutropenia and possible sepsis were reviewed. He is agreeable to proceed at this time.   2. IV access:Port-A-Cath inserted without any complications.  3. Antiemetics: No nausea or vomiting reported at this time.  Compazine is available to her.  4. Renal function surveillance:Her creatinine clearance around 35 cc/min and will adjust carboplatin dosing accordingly.  5. Goals of care:  Her disease remains curable at this time with aggressive measures are warranted.  6. Follow-up: She will return in 2 weeks for the start of cycle 2 of therapy.  30  minutes were spent on this encounter.  The time was dedicated to reviewing disease status, updating treatment options and future plan of care reviewed.  [name], MD 10/20/202111:30 AM

## 2020-07-16 NOTE — Patient Instructions (Signed)

## 2020-07-16 NOTE — Patient Instructions (Signed)
[facility] Discharge Instructions for Patients Receiving Chemotherapy  Today you received the following chemotherapy agents Gemcitabine (GEMZAR).  To help prevent nausea and vomiting after your treatment, we encourage you to take your nausea medication as prescribed.   If you develop nausea and vomiting that is not controlled by your nausea medication, call the clinic.   BELOW ARE SYMPTOMS THAT SHOULD BE REPORTED IMMEDIATELY:  *FEVER GREATER THAN 100.5 F  *CHILLS WITH OR WITHOUT FEVER  NAUSEA AND VOMITING THAT IS NOT CONTROLLED WITH YOUR NAUSEA MEDICATION  *UNUSUAL SHORTNESS OF BREATH  *UNUSUAL BRUISING OR BLEEDING  TENDERNESS IN MOUTH AND THROAT WITH OR WITHOUT PRESENCE OF ULCERS  *URINARY PROBLEMS  *BOWEL PROBLEMS  UNUSUAL RASH Items with * indicate a potential emergency and should be followed up as soon as possible.  Feel free to call the clinic should you have any questions or concerns. The clinic phone number is [phone].  Please show the [facility] at check-in to the Emergency Department and triage nurse.  Influenza Virus Vaccine injection What is this medicine? INFLUENZA VIRUS VACCINE (in floo EN zuh VAHY ruhs vak SEEN) helps to reduce the risk of getting influenza also known as the flu. The vaccine only helps protect you against some strains of the flu. This medicine may be used for other purposes; ask your health care provider or pharmacist if you have questions. COMMON BRAND NAME(S): Afluria, Afluria Quadrivalent, Agriflu, Alfuria, FLUAD, Fluarix, Fluarix Quadrivalent, Flublok, Flublok Quadrivalent, FLUCELVAX, FLUCELVAX Quadrivalent, Flulaval, Flulaval Quadrivalent, Fluvirin, Fluzone, Fluzone High-Dose, Fluzone Intradermal, Fluzone Quadrivalent What should I tell my health care provider before I take this medicine? They need to know if you have any of these conditions:  bleeding disorder like hemophilia  fever or  infection  Guillain-Barre syndrome or other neurological problems  immune system problems  infection with the human immunodeficiency virus (HIV) or AIDS  low blood platelet counts  multiple sclerosis  an unusual or allergic reaction to influenza virus vaccine, latex, other medicines, foods, dyes, or preservatives. Different brands of vaccines contain different allergens. Some may contain latex or eggs. Talk to your doctor about your allergies to make sure that you get the right vaccine.  pregnant or trying to get pregnant  breast-feeding How should I use this medicine? This vaccine is for injection into a muscle or under the skin. It is given by a health care professional. A copy of Vaccine Information Statements will be given before each vaccination. Read this sheet carefully each time. The sheet may change frequently. Talk to your healthcare provider to see which vaccines are right for you. Some vaccines should not be used in all age groups. Overdosage: If you think you have taken too much of this medicine contact a poison control center or emergency room at once. NOTE: This medicine is only for you. Do not share this medicine with others. What if I miss a dose? This does not apply. What may interact with this medicine?  chemotherapy or radiation therapy  medicines that lower your immune system like etanercept, anakinra, infliximab, and adalimumab  medicines that treat or prevent blood clots like warfarin  phenytoin  steroid medicines like prednisone or cortisone  theophylline  vaccines This list may not describe all possible interactions. Give your health care provider a list of all the medicines, herbs, non-prescription drugs, or dietary supplements you use. Also tell them if you smoke, drink alcohol, or use illegal drugs. Some items may interact with your medicine. What  should I watch for while using this medicine? Report any side effects that do not go away within 3  days to your doctor or health care professional. Call your health care provider if any unusual symptoms occur within 6 weeks of receiving this vaccine. You may still catch the flu, but the illness is not usually as bad. You cannot get the flu from the vaccine. The vaccine will not protect against colds or other illnesses that may cause fever. The vaccine is needed every year. What side effects may I notice from receiving this medicine? Side effects that you should report to your doctor or health care professional as soon as possible:  allergic reactions like skin rash, itching or hives, swelling of the face, lips, or tongue Side effects that usually do not require medical attention (report to your doctor or health care professional if they continue or are bothersome):  fever  headache  muscle aches and pains  pain, tenderness, redness, or swelling at the injection site  tiredness This list may not describe all possible side effects. Call your doctor for medical advice about side effects. You may report side effects to FDA at 1-800-FDA-1088. Where should I keep my medicine? The vaccine will be given by a health care professional in a clinic, pharmacy, doctor's office, or other health care setting. You will not be given vaccine doses to store at home. NOTE: This sheet is a summary. It may not cover all possible information. If you have questions about this medicine, talk to your doctor, pharmacist, or health care provider.  2020 Elsevier/Gold Standard (2018-08-08 08:45:43)

## 2020-07-17 ENCOUNTER — Other Ambulatory Visit: Payer: Medicare PPO

## 2020-07-17 ENCOUNTER — Ambulatory Visit: Payer: Medicare PPO | Admitting: Oncology

## 2020-07-28 DIAGNOSIS — N1831 Chronic kidney disease, stage 3a: Secondary | ICD-10-CM | POA: Diagnosis not present

## 2020-07-28 DIAGNOSIS — C679 Malignant neoplasm of bladder, unspecified: Secondary | ICD-10-CM | POA: Diagnosis not present

## 2020-07-28 DIAGNOSIS — I700 Atherosclerosis of aorta: Secondary | ICD-10-CM | POA: Diagnosis not present

## 2020-07-31 ENCOUNTER — Inpatient Hospital Stay: Payer: Medicare PPO | Attending: Oncology

## 2020-07-31 ENCOUNTER — Other Ambulatory Visit: Payer: Self-pay

## 2020-07-31 ENCOUNTER — Inpatient Hospital Stay: Payer: Medicare PPO

## 2020-07-31 ENCOUNTER — Inpatient Hospital Stay: Payer: Medicare PPO | Admitting: Oncology

## 2020-07-31 VITALS — BP 186/73 | HR 72 | Temp 97.4°F | Resp 18 | Ht 65.0 in | Wt 146.5 lb

## 2020-07-31 DIAGNOSIS — R300 Dysuria: Secondary | ICD-10-CM | POA: Diagnosis not present

## 2020-07-31 DIAGNOSIS — C651 Malignant neoplasm of right renal pelvis: Secondary | ICD-10-CM | POA: Diagnosis not present

## 2020-07-31 DIAGNOSIS — D709 Neutropenia, unspecified: Secondary | ICD-10-CM | POA: Insufficient documentation

## 2020-07-31 DIAGNOSIS — C649 Malignant neoplasm of unspecified kidney, except renal pelvis: Secondary | ICD-10-CM

## 2020-07-31 DIAGNOSIS — Z5111 Encounter for antineoplastic chemotherapy: Secondary | ICD-10-CM | POA: Diagnosis not present

## 2020-07-31 DIAGNOSIS — Z95828 Presence of other vascular implants and grafts: Secondary | ICD-10-CM

## 2020-07-31 DIAGNOSIS — Z79899 Other long term (current) drug therapy: Secondary | ICD-10-CM | POA: Insufficient documentation

## 2020-07-31 DIAGNOSIS — C641 Malignant neoplasm of right kidney, except renal pelvis: Secondary | ICD-10-CM

## 2020-07-31 LAB — CMP (CANCER CENTER ONLY)
ALT: 13 U/L (ref 0–44)
AST: 19 U/L (ref 15–41)
Albumin: 3.9 g/dL (ref 3.5–5.0)
Alkaline Phosphatase: 51 U/L (ref 38–126)
Anion gap: 6 (ref 5–15)
BUN: 33 mg/dL — ABNORMAL HIGH (ref 8–23)
CO2: 27 mmol/L (ref 22–32)
Calcium: 10 mg/dL (ref 8.9–10.3)
Chloride: 104 mmol/L (ref 98–111)
Creatinine: 1.63 mg/dL — ABNORMAL HIGH (ref 0.44–1.00)
GFR, Estimated: 30 mL/min — ABNORMAL LOW (ref >60)
Glucose, Bld: 82 mg/dL (ref 70–99)
Potassium: 4.9 mmol/L (ref 3.5–5.1)
Sodium: 137 mmol/L (ref 135–145)
Total Bilirubin: 0.4 mg/dL (ref 0.3–1.2)
Total Protein: 6.6 g/dL (ref 6.5–8.1)

## 2020-07-31 LAB — CBC WITH DIFFERENTIAL (CANCER CENTER ONLY)
Abs Immature Granulocytes: 0 10*3/uL (ref 0.00–0.07)
Basophils Absolute: 0 10*3/uL (ref 0.0–0.1)
Basophils Relative: 1 %
Eosinophils Absolute: 0.1 10*3/uL (ref 0.0–0.5)
Eosinophils Relative: 1 %
HCT: 33 % — ABNORMAL LOW (ref 36.0–46.0)
Hemoglobin: 10.8 g/dL — ABNORMAL LOW (ref 12.0–15.0)
Immature Granulocytes: 0 %
Lymphocytes Relative: 39 %
Lymphs Abs: 1.6 10*3/uL (ref 0.7–4.0)
MCH: 29 pg (ref 26.0–34.0)
MCHC: 32.7 g/dL (ref 30.0–36.0)
MCV: 88.5 fL (ref 80.0–100.0)
Monocytes Absolute: 0.7 10*3/uL (ref 0.1–1.0)
Monocytes Relative: 16 %
Neutro Abs: 1.8 10*3/uL (ref 1.7–7.7)
Neutrophils Relative %: 43 %
Platelet Count: 214 10*3/uL (ref 150–400)
RBC: 3.73 MIL/uL — ABNORMAL LOW (ref 3.87–5.11)
RDW: 13.9 % (ref 11.5–15.5)
WBC Count: 4.2 10*3/uL (ref 4.0–10.5)
nRBC: 0 % (ref 0.0–0.2)

## 2020-07-31 MED ORDER — PALONOSETRON HCL INJECTION 0.25 MG/5ML
INTRAVENOUS | Status: AC
  Filled 2020-07-31: qty 5

## 2020-07-31 MED ORDER — SODIUM CHLORIDE 0.9 % IV SOLN
800.0000 mg/m2 | Freq: Once | INTRAVENOUS | Status: AC
  Administered 2020-07-31: 1368 mg via INTRAVENOUS
  Filled 2020-07-31: qty 35.98

## 2020-07-31 MED ORDER — PALONOSETRON HCL INJECTION 0.25 MG/5ML
0.2500 mg | Freq: Once | INTRAVENOUS | Status: AC
  Administered 2020-07-31: 0.25 mg via INTRAVENOUS

## 2020-07-31 MED ORDER — SODIUM CHLORIDE 0.9 % IV SOLN
240.0000 mg | Freq: Once | INTRAVENOUS | Status: AC
  Administered 2020-07-31: 240 mg via INTRAVENOUS
  Filled 2020-07-31: qty 24

## 2020-07-31 MED ORDER — SODIUM CHLORIDE 0.9 % IV SOLN
150.0000 mg | Freq: Once | INTRAVENOUS | Status: AC
  Administered 2020-07-31: 150 mg via INTRAVENOUS
  Filled 2020-07-31: qty 150

## 2020-07-31 MED ORDER — SODIUM CHLORIDE 0.9 % IV SOLN
10.0000 mg | Freq: Once | INTRAVENOUS | Status: AC
  Administered 2020-07-31: 10 mg via INTRAVENOUS
  Filled 2020-07-31: qty 10

## 2020-07-31 MED ORDER — SODIUM CHLORIDE 0.9 % IV SOLN
Freq: Once | INTRAVENOUS | Status: AC
  Filled 2020-07-31: qty 250

## 2020-07-31 MED ORDER — SODIUM CHLORIDE 0.9% FLUSH
10.0000 mL | INTRAVENOUS | Status: DC | PRN
  Administered 2020-07-31: 10 mL
  Filled 2020-07-31: qty 10

## 2020-07-31 MED ORDER — SODIUM CHLORIDE 0.9% FLUSH
10.0000 mL | Freq: Once | INTRAVENOUS | Status: DC
  Filled 2020-07-31: qty 10

## 2020-07-31 MED ORDER — HEPARIN SOD (PORK) LOCK FLUSH 100 UNIT/ML IV SOLN
500.0000 [IU] | Freq: Once | INTRAVENOUS | Status: AC | PRN
  Administered 2020-07-31: 500 [IU]
  Filled 2020-07-31: qty 5

## 2020-07-31 NOTE — Progress Notes (Signed)
Per Dr. Alen Blew it is ok to treat with Gemzar and Carboplatin today with creatinine 1.63

## 2020-07-31 NOTE — Patient Instructions (Signed)
[facility] Discharge Instructions for Patients Receiving Chemotherapy  Today you received the following chemotherapy agents Gemzar and Carboplatin   To help prevent nausea and vomiting after your treatment, we encourage you to take your nausea medication as directed.    If you develop nausea and vomiting that is not controlled by your nausea medication, call the clinic.   BELOW ARE SYMPTOMS THAT SHOULD BE REPORTED IMMEDIATELY:  *FEVER GREATER THAN 100.5 F  *CHILLS WITH OR WITHOUT FEVER  NAUSEA AND VOMITING THAT IS NOT CONTROLLED WITH YOUR NAUSEA MEDICATION  *UNUSUAL SHORTNESS OF BREATH  *UNUSUAL BRUISING OR BLEEDING  TENDERNESS IN MOUTH AND THROAT WITH OR WITHOUT PRESENCE OF ULCERS  *URINARY PROBLEMS  *BOWEL PROBLEMS  UNUSUAL RASH Items with * indicate a potential emergency and should be followed up as soon as possible.  Feel free to call the clinic should you have any questions or concerns. The clinic phone number is [phone].  Please show the CHEMO ALERT CARD at check-in to the Emergency Department and triage nurse.   

## 2020-07-31 NOTE — Progress Notes (Signed)
Hematology and Oncology Follow Up Visit  Kayla Bowers [id number] 1928/10/16 86 y.o. 07/31/2020 11:14 AM [name], MDHodges, [name], MD   Principle Diagnosis: 86 year old woman with T3N2 high-grade urothelial carcinoma of the right renal pelvis diagnosed in 2021.     Prior Therapy:  He is status post robotic assisted right nephro ureterectomy and right adrenalectomy completed by Dr. Lovena Neighbours on May 21, 2020.  Current therapy: Adjuvant chemotherapy utilizing carboplatin and gemcitabine started on July 11, 2020.  He is here for day 1 of cycle 2 of therapy.  Interim History: Kayla Bowers presents today for repeat evaluation.  Since the last visit, she completed the first cycle of therapy without any major complications.  She did report occasional constipation and some diarrhea but all has resolved.  She reported some mild fatigue tiredness no other complaints.  She denies any nausea vomiting weight loss.  Appetite excellent and gained more weight.  She denies any hospitalization or illnesses.     Medications: Updated on review. Current Outpatient Medications  Medication Sig Dispense Refill  . docusate sodium (COLACE) 100 MG capsule Take 1 capsule (100 mg total) by mouth 2 (two) times daily. (Patient not taking: Reported on 06/27/2020)    . fenofibrate 160 MG tablet Take 160 mg by mouth at bedtime.    . hydrochlorothiazide (MICROZIDE) 12.5 MG capsule Take 12.5 mg by mouth daily.     Marland Kitchen HYDROcodone-acetaminophen (NORCO) 5-325 MG tablet Take 1-2 tablets by mouth every 6 (six) hours as needed for moderate pain. (Patient not taking: Reported on 06/27/2020) 20 tablet 0  . lidocaine-prilocaine (EMLA) cream Apply 1 application topically as needed. 30 g 0  . Multiple Vitamins-Minerals (PRESERVISION AREDS PO) Take 1 capsule by mouth daily.    . prochlorperazine (COMPAZINE) 10 MG tablet Take 1 tablet (10 mg total) by mouth every 6 (six) hours as needed for nausea or vomiting. 30 tablet 0  .  promethazine (PHENERGAN) 12.5 MG tablet Take 1 tablet (12.5 mg total) by mouth every 4 (four) hours as needed for nausea or vomiting. (Patient not taking: Reported on 06/27/2020) 15 tablet 0   No current facility-administered medications for this visit.     Allergies:  Allergies  Allergen Reactions  . Flomax [Tamsulosin Hcl] Nausea Only     Physical Exam:  Blood pressure (!) 186/73, pulse 72, temperature (!) 97.4 F (36.3 C), temperature source Tympanic, resp. rate 18, height 5\' 5"  (1.651 m), weight 146 lb 8 oz (66.5 kg), SpO2 100 %.   ECOG: 1    General appearance: Alert, awake without any distress. Head: Atraumatic without abnormalities Oropharynx: Without any thrush or ulcers. Eyes: No scleral icterus. Lymph nodes: No lymphadenopathy noted in the cervical, supraclavicular, or axillary nodes Heart:regular rate and rhythm, without any murmurs or gallops.   Lung: Clear to auscultation without any rhonchi, wheezes or dullness to percussion. Abdomin: Soft, nontender without any shifting dullness or ascites. Musculoskeletal: No clubbing or cyanosis. Neurological: No motor or sensory deficits. Skin: No rashes or lesions.     Lab Results: Lab Results  Component Value Date   WBC 3.5 (L) 07/16/2020   HGB 11.9 (L) 07/16/2020   HCT 36.5 07/16/2020   MCV 88.6 07/16/2020   PLT 230 07/16/2020     Chemistry      Component Value Date/Time   NA 133 (L) 07/16/2020 1150   K 4.8 07/16/2020 1150   CL 98 07/16/2020 1150   CO2 30 07/16/2020 1150   BUN 28 (H) 07/16/2020 1150  CREATININE 1.10 (H) 07/16/2020 1150      Component Value Date/Time   CALCIUM 10.2 07/16/2020 1150   ALKPHOS 52 07/16/2020 1150   AST 27 07/16/2020 1150   ALT 18 07/16/2020 1150   BILITOT 0.4 07/16/2020 1150       Impression and Plan:   86 year old woman with:  1.    T3N2 high-grade urothelial carcinoma of the right renal pelvis diagnosed August 2021.  She is status post surgical resection  and currently receiving adjuvant chemotherapy with gemcitabine and carboplatin.  Risks and benefits of continuing this treatment for 4 cycles were reviewed.  Complications occluding myelosuppression, neutropenia, sepsis and renal failure were reiterated.  Alternative treatment options including active surveillance.  After discussion she is agreeable to proceed.   2. IV access:Port-A-Cath remains in use without any issues.  3. Antiemetics: Compazine is available to her without any nausea or vomiting.  4. Renal function surveillance:Creatinine clearance improved slightly at 44 cc/min.  5. Goals of care:  Therapy remains aggressive at this time with curable goal.  Her performance status remains reasonable.  6. Follow-up: In 1 week to complete cycle 2 and in 3 weeks for the start of cycle 3 of therapy.  30  minutes were dedicated to this visit.  Time was spent on reviewing disease status update, reviewing laboratory data, discussing treatment options and future plan of care.  [name], MD 11/4/202111:14 AM

## 2020-07-31 NOTE — Patient Instructions (Signed)

## 2020-08-05 DIAGNOSIS — I129 Hypertensive chronic kidney disease with stage 1 through stage 4 chronic kidney disease, or unspecified chronic kidney disease: Secondary | ICD-10-CM | POA: Diagnosis not present

## 2020-08-06 DIAGNOSIS — H353132 Nonexudative age-related macular degeneration, bilateral, intermediate dry stage: Secondary | ICD-10-CM | POA: Diagnosis not present

## 2020-08-07 ENCOUNTER — Inpatient Hospital Stay: Payer: Medicare PPO

## 2020-08-07 ENCOUNTER — Other Ambulatory Visit: Payer: Self-pay

## 2020-08-07 ENCOUNTER — Encounter: Payer: Self-pay | Admitting: Oncology

## 2020-08-07 VITALS — BP 179/70 | HR 75 | Temp 98.2°F | Resp 18 | Wt 145.0 lb

## 2020-08-07 DIAGNOSIS — Z95828 Presence of other vascular implants and grafts: Secondary | ICD-10-CM

## 2020-08-07 DIAGNOSIS — Z79899 Other long term (current) drug therapy: Secondary | ICD-10-CM | POA: Diagnosis not present

## 2020-08-07 DIAGNOSIS — C641 Malignant neoplasm of right kidney, except renal pelvis: Secondary | ICD-10-CM

## 2020-08-07 DIAGNOSIS — R300 Dysuria: Secondary | ICD-10-CM | POA: Diagnosis not present

## 2020-08-07 DIAGNOSIS — C649 Malignant neoplasm of unspecified kidney, except renal pelvis: Secondary | ICD-10-CM

## 2020-08-07 DIAGNOSIS — Z5111 Encounter for antineoplastic chemotherapy: Secondary | ICD-10-CM | POA: Diagnosis not present

## 2020-08-07 DIAGNOSIS — D709 Neutropenia, unspecified: Secondary | ICD-10-CM | POA: Diagnosis not present

## 2020-08-07 DIAGNOSIS — C651 Malignant neoplasm of right renal pelvis: Secondary | ICD-10-CM | POA: Diagnosis not present

## 2020-08-07 LAB — CMP (CANCER CENTER ONLY)
ALT: 21 U/L (ref 0–44)
AST: 29 U/L (ref 15–41)
Albumin: 3.8 g/dL (ref 3.5–5.0)
Alkaline Phosphatase: 55 U/L (ref 38–126)
Anion gap: 7 (ref 5–15)
BUN: 24 mg/dL — ABNORMAL HIGH (ref 8–23)
CO2: 28 mmol/L (ref 22–32)
Calcium: 9.9 mg/dL (ref 8.9–10.3)
Chloride: 100 mmol/L (ref 98–111)
Creatinine: 1.16 mg/dL — ABNORMAL HIGH (ref 0.44–1.00)
GFR, Estimated: 45 mL/min — ABNORMAL LOW (ref >60)
Glucose, Bld: 97 mg/dL (ref 70–99)
Potassium: 4.6 mmol/L (ref 3.5–5.1)
Sodium: 135 mmol/L (ref 135–145)
Total Bilirubin: 0.5 mg/dL (ref 0.3–1.2)
Total Protein: 6.7 g/dL (ref 6.5–8.1)

## 2020-08-07 LAB — URINALYSIS, ROUTINE W REFLEX MICROSCOPIC
Bilirubin Urine: NEGATIVE
Glucose, UA: NEGATIVE mg/dL
Hgb urine dipstick: NEGATIVE
Ketones, ur: NEGATIVE mg/dL
Nitrite: POSITIVE — AB
Protein, ur: 30 mg/dL — AB
Specific Gravity, Urine: 1.009 (ref 1.005–1.030)
WBC, UA: >50 WBC/hpf — ABNORMAL HIGH (ref 0–5)
pH: 7 (ref 5.0–8.0)

## 2020-08-07 LAB — CBC WITH DIFFERENTIAL (CANCER CENTER ONLY)
Abs Immature Granulocytes: 0.02 10*3/uL (ref 0.00–0.07)
Basophils Absolute: 0 10*3/uL (ref 0.0–0.1)
Basophils Relative: 1 %
Eosinophils Absolute: 0 10*3/uL (ref 0.0–0.5)
Eosinophils Relative: 1 %
HCT: 31.5 % — ABNORMAL LOW (ref 36.0–46.0)
Hemoglobin: 10.5 g/dL — ABNORMAL LOW (ref 12.0–15.0)
Immature Granulocytes: 1 %
Lymphocytes Relative: 37 %
Lymphs Abs: 1.5 10*3/uL (ref 0.7–4.0)
MCH: 29.6 pg (ref 26.0–34.0)
MCHC: 33.3 g/dL (ref 30.0–36.0)
MCV: 88.7 fL (ref 80.0–100.0)
Monocytes Absolute: 0.5 10*3/uL (ref 0.1–1.0)
Monocytes Relative: 11 %
Neutro Abs: 2 10*3/uL (ref 1.7–7.7)
Neutrophils Relative %: 49 %
Platelet Count: 165 10*3/uL (ref 150–400)
RBC: 3.55 MIL/uL — ABNORMAL LOW (ref 3.87–5.11)
RDW: 14 % (ref 11.5–15.5)
WBC Count: 4 10*3/uL (ref 4.0–10.5)
nRBC: 0 % (ref 0.0–0.2)

## 2020-08-07 MED ORDER — HEPARIN SOD (PORK) LOCK FLUSH 100 UNIT/ML IV SOLN
500.0000 [IU] | Freq: Once | INTRAVENOUS | Status: AC | PRN
  Administered 2020-08-07: 500 [IU]
  Filled 2020-08-07: qty 5

## 2020-08-07 MED ORDER — PROCHLORPERAZINE MALEATE 10 MG PO TABS
10.0000 mg | ORAL_TABLET | Freq: Once | ORAL | Status: AC
  Administered 2020-08-07: 10 mg via ORAL

## 2020-08-07 MED ORDER — SODIUM CHLORIDE 0.9 % IV SOLN
Freq: Once | INTRAVENOUS | Status: AC
  Filled 2020-08-07: qty 250

## 2020-08-07 MED ORDER — SODIUM CHLORIDE 0.9 % IV SOLN
800.0000 mg/m2 | Freq: Once | INTRAVENOUS | Status: AC
  Administered 2020-08-07: 1368 mg via INTRAVENOUS
  Filled 2020-08-07: qty 35.98

## 2020-08-07 MED ORDER — SODIUM CHLORIDE 0.9% FLUSH
10.0000 mL | INTRAVENOUS | Status: DC | PRN
  Administered 2020-08-07: 10 mL
  Filled 2020-08-07: qty 10

## 2020-08-07 MED ORDER — SODIUM CHLORIDE 0.9% FLUSH
10.0000 mL | Freq: Once | INTRAVENOUS | Status: AC
  Administered 2020-08-07: 10 mL
  Filled 2020-08-07: qty 10

## 2020-08-07 NOTE — Progress Notes (Signed)
Met with patient at registration to introduce myself as Financial Resource Specialist and to offer available resources. ° °Discussed one-time $1000 [employer] grant and qualifications to assist with personal expenses while going through treatment. ° °Gave her my card if interested in applying and for any additional financial questions or concerns. °

## 2020-08-07 NOTE — Patient Instructions (Signed)
[facility] Discharge Instructions for Patients Receiving Chemotherapy  Today you received the following chemotherapy agents Gemzar To help prevent nausea and vomiting after your treatment, we encourage you to take your nausea medication as prescribed.  If you develop nausea and vomiting that is not controlled by your nausea medication, call the clinic.   BELOW ARE SYMPTOMS THAT SHOULD BE REPORTED IMMEDIATELY:  *FEVER GREATER THAN 100.5 F  *CHILLS WITH OR WITHOUT FEVER  NAUSEA AND VOMITING THAT IS NOT CONTROLLED WITH YOUR NAUSEA MEDICATION  *UNUSUAL SHORTNESS OF BREATH  *UNUSUAL BRUISING OR BLEEDING  TENDERNESS IN MOUTH AND THROAT WITH OR WITHOUT PRESENCE OF ULCERS  *URINARY PROBLEMS  *BOWEL PROBLEMS  UNUSUAL RASH Items with * indicate a potential emergency and should be followed up as soon as possible.  Feel free to call the clinic should you have any questions or concerns. The clinic phone number is [phone].  Please show the CHEMO ALERT CARD at check-in to the Emergency Department and triage nurse.   

## 2020-08-07 NOTE — Progress Notes (Signed)
Per Dr. Alen Blew - okay to treat with elevated BP.

## 2020-08-08 ENCOUNTER — Telehealth: Payer: Self-pay

## 2020-08-08 ENCOUNTER — Other Ambulatory Visit: Payer: Self-pay | Admitting: Oncology

## 2020-08-08 MED ORDER — CIPROFLOXACIN HCL 500 MG PO TABS: 500.0000 mg | ORAL_TABLET | Freq: Two times a day (BID) | ORAL | 0 refills | Status: DC

## 2020-08-08 NOTE — Telephone Encounter (Signed)
Called patient and let her know that per Dr. Alen Blew she has a UTI and a prescription for Cipro has been sent to her pharmacy. Patient verbalized understanding and knows to contact the office with any further questions or concerns.

## 2020-08-08 NOTE — Telephone Encounter (Signed)
-----   Message from Wyatt Portela, MD sent at 08/08/2020  8:34 AM EST ----- Please let her know she has UTI. I sent Cipro Rx to her pharmacy for 10 days.

## 2020-08-09 LAB — URINE CULTURE: Culture: >=100000 — AB

## 2020-08-11 ENCOUNTER — Other Ambulatory Visit: Payer: Self-pay | Admitting: Oncology

## 2020-08-11 ENCOUNTER — Telehealth: Payer: Self-pay | Admitting: *Deleted

## 2020-08-11 MED ORDER — AMPICILLIN 500 MG PO CAPS: 500.0000 mg | ORAL_CAPSULE | Freq: Three times a day (TID) | ORAL | 0 refills | Status: DC

## 2020-08-11 NOTE — Telephone Encounter (Signed)
Per Dr. Hazeline Junker request called patient and advised to stop Cipro for UTI and to start the Ampicillin based on her recent urine culture results. She advised that she felt that might be the case since the UTI hadn't improved any at all with the Cipro.  No further questions.

## 2020-08-11 NOTE — Telephone Encounter (Signed)
-----   Message from Wyatt Portela, MD sent at 08/11/2020  8:28 AM EST ----- Please let her know to stop Cipro (was called last week for UTI). I sent her an Rx for ampicillin to take 3 times a day for 5 days. Her UTI is sensitive to ampicillin and not Cipro.

## 2020-08-15 DIAGNOSIS — I129 Hypertensive chronic kidney disease with stage 1 through stage 4 chronic kidney disease, or unspecified chronic kidney disease: Secondary | ICD-10-CM | POA: Diagnosis not present

## 2020-08-15 DIAGNOSIS — E782 Mixed hyperlipidemia: Secondary | ICD-10-CM | POA: Diagnosis not present

## 2020-08-15 DIAGNOSIS — N183 Chronic kidney disease, stage 3 unspecified: Secondary | ICD-10-CM | POA: Diagnosis not present

## 2020-08-15 DIAGNOSIS — N390 Urinary tract infection, site not specified: Secondary | ICD-10-CM | POA: Diagnosis not present

## 2020-08-15 DIAGNOSIS — Z139 Encounter for screening, unspecified: Secondary | ICD-10-CM | POA: Diagnosis not present

## 2020-08-22 ENCOUNTER — Inpatient Hospital Stay: Payer: Medicare PPO

## 2020-08-22 ENCOUNTER — Other Ambulatory Visit: Payer: Self-pay

## 2020-08-22 ENCOUNTER — Inpatient Hospital Stay: Payer: Medicare PPO | Admitting: Oncology

## 2020-08-22 VITALS — BP 155/65 | HR 69 | Temp 98.2°F | Resp 18 | Ht 65.0 in | Wt 149.7 lb

## 2020-08-22 DIAGNOSIS — C649 Malignant neoplasm of unspecified kidney, except renal pelvis: Secondary | ICD-10-CM

## 2020-08-22 DIAGNOSIS — Z79899 Other long term (current) drug therapy: Secondary | ICD-10-CM | POA: Diagnosis not present

## 2020-08-22 DIAGNOSIS — D709 Neutropenia, unspecified: Secondary | ICD-10-CM | POA: Diagnosis not present

## 2020-08-22 DIAGNOSIS — C641 Malignant neoplasm of right kidney, except renal pelvis: Secondary | ICD-10-CM

## 2020-08-22 DIAGNOSIS — Z5111 Encounter for antineoplastic chemotherapy: Secondary | ICD-10-CM | POA: Diagnosis not present

## 2020-08-22 DIAGNOSIS — R300 Dysuria: Secondary | ICD-10-CM | POA: Diagnosis not present

## 2020-08-22 DIAGNOSIS — Z95828 Presence of other vascular implants and grafts: Secondary | ICD-10-CM

## 2020-08-22 DIAGNOSIS — C651 Malignant neoplasm of right renal pelvis: Secondary | ICD-10-CM | POA: Diagnosis not present

## 2020-08-22 LAB — CBC WITH DIFFERENTIAL (CANCER CENTER ONLY)
Abs Immature Granulocytes: 0 10*3/uL (ref 0.00–0.07)
Basophils Absolute: 0 10*3/uL (ref 0.0–0.1)
Basophils Relative: 1 %
Eosinophils Absolute: 0 10*3/uL (ref 0.0–0.5)
Eosinophils Relative: 2 %
HCT: 27.7 % — ABNORMAL LOW (ref 36.0–46.0)
Hemoglobin: 9.5 g/dL — ABNORMAL LOW (ref 12.0–15.0)
Immature Granulocytes: 0 %
Lymphocytes Relative: 52 %
Lymphs Abs: 1.3 10*3/uL (ref 0.7–4.0)
MCH: 30.4 pg (ref 26.0–34.0)
MCHC: 34.3 g/dL (ref 30.0–36.0)
MCV: 88.8 fL (ref 80.0–100.0)
Monocytes Absolute: 0.5 10*3/uL (ref 0.1–1.0)
Monocytes Relative: 21 %
Neutro Abs: 0.6 10*3/uL — ABNORMAL LOW (ref 1.7–7.7)
Neutrophils Relative %: 24 %
Platelet Count: 215 10*3/uL (ref 150–400)
RBC: 3.12 MIL/uL — ABNORMAL LOW (ref 3.87–5.11)
RDW: 17 % — ABNORMAL HIGH (ref 11.5–15.5)
WBC Count: 2.4 10*3/uL — ABNORMAL LOW (ref 4.0–10.5)
nRBC: 0 % (ref 0.0–0.2)

## 2020-08-22 LAB — CMP (CANCER CENTER ONLY)
ALT: 16 U/L (ref 0–44)
AST: 23 U/L (ref 15–41)
Albumin: 3.8 g/dL (ref 3.5–5.0)
Alkaline Phosphatase: 54 U/L (ref 38–126)
Anion gap: 8 (ref 5–15)
BUN: 26 mg/dL — ABNORMAL HIGH (ref 8–23)
CO2: 26 mmol/L (ref 22–32)
Calcium: 9.9 mg/dL (ref 8.9–10.3)
Chloride: 98 mmol/L (ref 98–111)
Creatinine: 1.37 mg/dL — ABNORMAL HIGH (ref 0.44–1.00)
GFR, Estimated: 37 mL/min — ABNORMAL LOW (ref >60)
Glucose, Bld: 90 mg/dL (ref 70–99)
Potassium: 4.3 mmol/L (ref 3.5–5.1)
Sodium: 132 mmol/L — ABNORMAL LOW (ref 135–145)
Total Bilirubin: 0.4 mg/dL (ref 0.3–1.2)
Total Protein: 6.5 g/dL (ref 6.5–8.1)

## 2020-08-22 MED ORDER — PHENAZOPYRIDINE HCL 95 MG PO TABS: 95.0000 mg | ORAL_TABLET | Freq: Three times a day (TID) | ORAL | 0 refills | Status: DC | PRN

## 2020-08-22 MED ORDER — SODIUM CHLORIDE 0.9% FLUSH
10.0000 mL | Freq: Once | INTRAVENOUS | Status: AC
  Administered 2020-08-22: 10 mL
  Filled 2020-08-22: qty 10

## 2020-08-22 NOTE — Progress Notes (Signed)
Hematology and Oncology Follow Up Visit  Kayla Bowers [id number] 1929-08-29 86 y.o. 08/22/2020 8:35 AM [name], MDHodges, [name], MD   Principle Diagnosis: 86 year old woman with urothelial carcinoma of the right renal pelvis diagnosed in August 2021.  She was found to have T3N2 high-grade tumor.   Prior Therapy:  He is status post robotic assisted right nephro ureterectomy and right adrenalectomy completed by Dr. Lovena Neighbours on May 21, 2020.  Current therapy: Adjuvant chemotherapy utilizing carboplatin and gemcitabine started on July 11, 2020.  Day 1 of cycle 3 of therapy.  Interim History: Kayla Bowers returns today for a repeat evaluation.  Since her last visit, she reports no major changes in her health.  She continues to have issues with dysuria and incomplete emptying in her bladder.  She was prescribed antibiotics for urinary tract infection and completed 2 weeks.  She did have an E. coli that is resistant to Cipro and was switched to ampicillin.  She denies any fevers, chills or abdominal pain discomfort.  She denies any appetite changes or decline in her performance status.     Medications: Unchanged on review. Current Outpatient Medications  Medication Sig Dispense Refill  . ampicillin (PRINCIPEN) 500 MG capsule Take 1 capsule (500 mg total) by mouth 3 (three) times daily. 15 capsule 0  . docusate sodium (COLACE) 100 MG capsule Take 1 capsule (100 mg total) by mouth 2 (two) times daily. (Patient not taking: Reported on 06/27/2020)    . fenofibrate 160 MG tablet Take 160 mg by mouth at bedtime.    . hydrochlorothiazide (MICROZIDE) 12.5 MG capsule Take 12.5 mg by mouth daily.     Marland Kitchen HYDROcodone-acetaminophen (NORCO) 5-325 MG tablet Take 1-2 tablets by mouth every 6 (six) hours as needed for moderate pain. (Patient not taking: Reported on 06/27/2020) 20 tablet 0  . lidocaine-prilocaine (EMLA) cream Apply 1 application topically as needed. 30 g 0  . Multiple Vitamins-Minerals  (PRESERVISION AREDS PO) Take 1 capsule by mouth daily.    . prochlorperazine (COMPAZINE) 10 MG tablet Take 1 tablet (10 mg total) by mouth every 6 (six) hours as needed for nausea or vomiting. 30 tablet 0  . promethazine (PHENERGAN) 12.5 MG tablet Take 1 tablet (12.5 mg total) by mouth every 4 (four) hours as needed for nausea or vomiting. (Patient not taking: Reported on 06/27/2020) 15 tablet 0   No current facility-administered medications for this visit.   Facility-Administered Medications Ordered in Other Visits  Medication Dose Route Frequency Provider Last Rate Last Admin  . sodium chloride flush (NS) 0.9 % injection 10 mL  10 mL Intracatheter Once Wyatt Portela, MD         Allergies:  Allergies  Allergen Reactions  . Flomax [Tamsulosin Hcl] Nausea Only     Physical Exam:  Blood pressure (!) 155/65, pulse 69, temperature 98.2 F (36.8 C), temperature source Tympanic, resp. rate 18, height 5\' 5"  (1.651 m), weight 149 lb 11.2 oz (67.9 kg), SpO2 100 %.    ECOG: 1    General appearance: Comfortable appearing without any discomfort Head: Normocephalic without any trauma Oropharynx: Mucous membranes are moist and pink without any thrush or ulcers. Eyes: Pupils are equal and round reactive to light. Lymph nodes: No cervical, supraclavicular, inguinal or axillary lymphadenopathy.   Heart:regular rate and rhythm.  S1 and S2 without leg edema. Lung: Clear without any rhonchi or wheezes.  No dullness to percussion. Abdomin: Soft, nontender, nondistended with good bowel sounds.  No hepatosplenomegaly. Musculoskeletal: No  joint deformity or effusion.  Full range of motion noted. Neurological: No deficits noted on motor, sensory and deep tendon reflex exam. Skin: No petechial rash or dryness.  Appeared moist.        Lab Results: Lab Results  Component Value Date   WBC 4.0 08/07/2020   HGB 10.5 (L) 08/07/2020   HCT 31.5 (L) 08/07/2020   MCV 88.7 08/07/2020   PLT 165  08/07/2020     Chemistry      Component Value Date/Time   NA 135 08/07/2020 1139   K 4.6 08/07/2020 1139   CL 100 08/07/2020 1139   CO2 28 08/07/2020 1139   BUN 24 (H) 08/07/2020 1139   CREATININE 1.16 (H) 08/07/2020 1139      Component Value Date/Time   CALCIUM 9.9 08/07/2020 1139   ALKPHOS 55 08/07/2020 1139   AST 29 08/07/2020 1139   ALT 21 08/07/2020 1139   BILITOT 0.5 08/07/2020 1139       Impression and Plan:   86 year old woman with:  1.    Right renal pelvis tumor diagnosed in August 2020.  She was found to have T3N2 high-grade urothelial carcinoma.   She is status post surgical resection and currently receiving adjuvant chemotherapy with gemcitabine and carboplatin.  Risks and benefits of continuing this treatment for 4 cycles were reviewed.  Complications occluding myelosuppression, neutropenia, sepsis and renal failure were reiterated.  Alternative treatment options including active surveillance.  After discussion she is agreeable to proceed.  The start of cycle 3 of chemotherapy will be delayed 1 week given her neutropenia.  Day 1 of cycle 3 will be given on August 28, 2020.   2. IV access:Port-A-Cath will continue to be flushed periodically.  3. Antiemetics: No nausea or vomiting reported at this time.  Compazine is available to her.  4. Renal function surveillance:Creatinine clearance continues to be overall stable and improved at 40 cc/min.  We will continue to monitor on platinum therapy.  5. Goals of care:Treatment is curative at this time with aggressive measures are warranted.  6.  Burning urination: She has completed 2 weeks of antibiotics with ampicillin for her E. coli that is sensitive.  We will give a prescription for Azo and recommended follow-up with Dr. Lovena Neighbours if the symptoms persist.  7. Follow-up: In 1 week to start cycle 3 of therapy.  30  minutes were dedicated to this visit.  Time was spent on reviewing disease status  update, reviewing laboratory data, discussing treatment options and future plan of care.  [name], MD 11/26/20218:35 AM

## 2020-08-27 DIAGNOSIS — E782 Mixed hyperlipidemia: Secondary | ICD-10-CM | POA: Diagnosis not present

## 2020-08-27 DIAGNOSIS — N183 Chronic kidney disease, stage 3 unspecified: Secondary | ICD-10-CM | POA: Diagnosis not present

## 2020-08-27 DIAGNOSIS — I129 Hypertensive chronic kidney disease with stage 1 through stage 4 chronic kidney disease, or unspecified chronic kidney disease: Secondary | ICD-10-CM | POA: Diagnosis not present

## 2020-08-28 ENCOUNTER — Inpatient Hospital Stay: Payer: Medicare PPO | Attending: Oncology

## 2020-08-28 ENCOUNTER — Inpatient Hospital Stay: Payer: Medicare PPO

## 2020-08-28 ENCOUNTER — Other Ambulatory Visit: Payer: Self-pay

## 2020-08-28 VITALS — BP 140/55 | HR 62 | Temp 98.2°F | Resp 18

## 2020-08-28 DIAGNOSIS — Z5111 Encounter for antineoplastic chemotherapy: Secondary | ICD-10-CM | POA: Insufficient documentation

## 2020-08-28 DIAGNOSIS — C651 Malignant neoplasm of right renal pelvis: Secondary | ICD-10-CM | POA: Diagnosis not present

## 2020-08-28 DIAGNOSIS — Z95828 Presence of other vascular implants and grafts: Secondary | ICD-10-CM

## 2020-08-28 DIAGNOSIS — C641 Malignant neoplasm of right kidney, except renal pelvis: Secondary | ICD-10-CM

## 2020-08-28 DIAGNOSIS — C649 Malignant neoplasm of unspecified kidney, except renal pelvis: Secondary | ICD-10-CM

## 2020-08-28 LAB — CMP (CANCER CENTER ONLY)
ALT: 14 U/L (ref 0–44)
AST: 24 U/L (ref 15–41)
Albumin: 3.8 g/dL (ref 3.5–5.0)
Alkaline Phosphatase: 50 U/L (ref 38–126)
Anion gap: 6 (ref 5–15)
BUN: 23 mg/dL (ref 8–23)
CO2: 27 mmol/L (ref 22–32)
Calcium: 10 mg/dL (ref 8.9–10.3)
Chloride: 99 mmol/L (ref 98–111)
Creatinine: 1.43 mg/dL — ABNORMAL HIGH (ref 0.44–1.00)
GFR, Estimated: 35 mL/min — ABNORMAL LOW (ref >60)
Glucose, Bld: 97 mg/dL (ref 70–99)
Potassium: 4.5 mmol/L (ref 3.5–5.1)
Sodium: 132 mmol/L — ABNORMAL LOW (ref 135–145)
Total Bilirubin: 0.4 mg/dL (ref 0.3–1.2)
Total Protein: 6.4 g/dL — ABNORMAL LOW (ref 6.5–8.1)

## 2020-08-28 LAB — CBC WITH DIFFERENTIAL (CANCER CENTER ONLY)
Abs Immature Granulocytes: 0.01 10*3/uL (ref 0.00–0.07)
Basophils Absolute: 0 10*3/uL (ref 0.0–0.1)
Basophils Relative: 1 %
Eosinophils Absolute: 0.1 10*3/uL (ref 0.0–0.5)
Eosinophils Relative: 2 %
HCT: 30.6 % — ABNORMAL LOW (ref 36.0–46.0)
Hemoglobin: 10 g/dL — ABNORMAL LOW (ref 12.0–15.0)
Immature Granulocytes: 0 %
Lymphocytes Relative: 45 %
Lymphs Abs: 1.3 10*3/uL (ref 0.7–4.0)
MCH: 30.4 pg (ref 26.0–34.0)
MCHC: 32.7 g/dL (ref 30.0–36.0)
MCV: 93 fL (ref 80.0–100.0)
Monocytes Absolute: 0.5 10*3/uL (ref 0.1–1.0)
Monocytes Relative: 18 %
Neutro Abs: 1 10*3/uL — ABNORMAL LOW (ref 1.7–7.7)
Neutrophils Relative %: 34 %
Platelet Count: 442 10*3/uL — ABNORMAL HIGH (ref 150–400)
RBC: 3.29 MIL/uL — ABNORMAL LOW (ref 3.87–5.11)
RDW: 18.4 % — ABNORMAL HIGH (ref 11.5–15.5)
WBC Count: 2.9 10*3/uL — ABNORMAL LOW (ref 4.0–10.5)
nRBC: 0 % (ref 0.0–0.2)

## 2020-08-28 MED ORDER — SODIUM CHLORIDE 0.9 % IV SOLN
240.0000 mg | Freq: Once | INTRAVENOUS | Status: AC
  Administered 2020-08-28: 240 mg via INTRAVENOUS
  Filled 2020-08-28: qty 24

## 2020-08-28 MED ORDER — PALONOSETRON HCL INJECTION 0.25 MG/5ML
INTRAVENOUS | Status: AC
  Filled 2020-08-28: qty 5

## 2020-08-28 MED ORDER — HEPARIN SOD (PORK) LOCK FLUSH 100 UNIT/ML IV SOLN
500.0000 [IU] | Freq: Once | INTRAVENOUS | Status: AC | PRN
  Administered 2020-08-28: 500 [IU]
  Filled 2020-08-28: qty 5

## 2020-08-28 MED ORDER — SODIUM CHLORIDE 0.9 % IV SOLN
150.0000 mg | Freq: Once | INTRAVENOUS | Status: AC
  Administered 2020-08-28: 150 mg via INTRAVENOUS
  Filled 2020-08-28: qty 150
  Filled 2020-08-28: qty 5

## 2020-08-28 MED ORDER — SODIUM CHLORIDE 0.9% FLUSH
10.0000 mL | INTRAVENOUS | Status: DC | PRN
  Administered 2020-08-28: 10 mL
  Filled 2020-08-28: qty 10

## 2020-08-28 MED ORDER — SODIUM CHLORIDE 0.9% FLUSH
10.0000 mL | Freq: Once | INTRAVENOUS | Status: AC
  Administered 2020-08-28: 10 mL
  Filled 2020-08-28: qty 10

## 2020-08-28 MED ORDER — SODIUM CHLORIDE 0.9 % IV SOLN
Freq: Once | INTRAVENOUS | Status: AC
  Filled 2020-08-28: qty 250

## 2020-08-28 MED ORDER — SODIUM CHLORIDE 0.9 % IV SOLN
10.0000 mg | Freq: Once | INTRAVENOUS | Status: AC
  Administered 2020-08-28: 10 mg via INTRAVENOUS
  Filled 2020-08-28: qty 10
  Filled 2020-08-28: qty 1

## 2020-08-28 MED ORDER — SODIUM CHLORIDE 0.9 % IV SOLN
800.0000 mg/m2 | Freq: Once | INTRAVENOUS | Status: AC
  Administered 2020-08-28: 1368 mg via INTRAVENOUS
  Filled 2020-08-28: qty 35.98

## 2020-08-28 MED ORDER — PALONOSETRON HCL INJECTION 0.25 MG/5ML
0.2500 mg | Freq: Once | INTRAVENOUS | Status: AC
  Administered 2020-08-28: 0.25 mg via INTRAVENOUS

## 2020-08-28 NOTE — Progress Notes (Signed)
Per Dr. Alen Blew OK to treat with [place] of 1

## 2020-08-28 NOTE — Patient Instructions (Signed)

## 2020-08-28 NOTE — Patient Instructions (Signed)
[facility] Discharge Instructions for Patients Receiving Chemotherapy  Today you received the following chemotherapy agents Gemzar and Carboplatin   To help prevent nausea and vomiting after your treatment, we encourage you to take your nausea medication as directed.    If you develop nausea and vomiting that is not controlled by your nausea medication, call the clinic.   BELOW ARE SYMPTOMS THAT SHOULD BE REPORTED IMMEDIATELY:  *FEVER GREATER THAN 100.5 F  *CHILLS WITH OR WITHOUT FEVER  NAUSEA AND VOMITING THAT IS NOT CONTROLLED WITH YOUR NAUSEA MEDICATION  *UNUSUAL SHORTNESS OF BREATH  *UNUSUAL BRUISING OR BLEEDING  TENDERNESS IN MOUTH AND THROAT WITH OR WITHOUT PRESENCE OF ULCERS  *URINARY PROBLEMS  *BOWEL PROBLEMS  UNUSUAL RASH Items with * indicate a potential emergency and should be followed up as soon as possible.  Feel free to call the clinic should you have any questions or concerns. The clinic phone number is [phone].  Please show the CHEMO ALERT CARD at check-in to the Emergency Department and triage nurse.   

## 2020-09-01 DIAGNOSIS — N183 Chronic kidney disease, stage 3 unspecified: Secondary | ICD-10-CM | POA: Diagnosis not present

## 2020-09-01 DIAGNOSIS — K5909 Other constipation: Secondary | ICD-10-CM | POA: Diagnosis not present

## 2020-09-01 DIAGNOSIS — G622 Polyneuropathy due to other toxic agents: Secondary | ICD-10-CM | POA: Diagnosis not present

## 2020-09-01 DIAGNOSIS — I129 Hypertensive chronic kidney disease with stage 1 through stage 4 chronic kidney disease, or unspecified chronic kidney disease: Secondary | ICD-10-CM | POA: Diagnosis not present

## 2020-09-01 DIAGNOSIS — R319 Hematuria, unspecified: Secondary | ICD-10-CM | POA: Diagnosis not present

## 2020-09-04 ENCOUNTER — Inpatient Hospital Stay: Payer: Medicare PPO

## 2020-09-04 ENCOUNTER — Other Ambulatory Visit: Payer: Self-pay

## 2020-09-04 ENCOUNTER — Encounter: Payer: Self-pay | Admitting: Cardiology

## 2020-09-04 VITALS — BP 156/58 | HR 67 | Temp 98.5°F | Resp 17 | Wt 148.1 lb

## 2020-09-04 DIAGNOSIS — C641 Malignant neoplasm of right kidney, except renal pelvis: Secondary | ICD-10-CM

## 2020-09-04 DIAGNOSIS — Z5111 Encounter for antineoplastic chemotherapy: Secondary | ICD-10-CM | POA: Diagnosis not present

## 2020-09-04 DIAGNOSIS — C651 Malignant neoplasm of right renal pelvis: Secondary | ICD-10-CM | POA: Diagnosis not present

## 2020-09-04 DIAGNOSIS — C649 Malignant neoplasm of unspecified kidney, except renal pelvis: Secondary | ICD-10-CM

## 2020-09-04 LAB — CBC WITH DIFFERENTIAL (CANCER CENTER ONLY)
Abs Immature Granulocytes: 0 10*3/uL (ref 0.00–0.07)
Basophils Absolute: 0 10*3/uL (ref 0.0–0.1)
Basophils Relative: 1 %
Eosinophils Absolute: 0 10*3/uL (ref 0.0–0.5)
Eosinophils Relative: 1 %
HCT: 28.7 % — ABNORMAL LOW (ref 36.0–46.0)
Hemoglobin: 9.6 g/dL — ABNORMAL LOW (ref 12.0–15.0)
Immature Granulocytes: 0 %
Lymphocytes Relative: 49 %
Lymphs Abs: 1.2 10*3/uL (ref 0.7–4.0)
MCH: 30.9 pg (ref 26.0–34.0)
MCHC: 33.4 g/dL (ref 30.0–36.0)
MCV: 92.3 fL (ref 80.0–100.0)
Monocytes Absolute: 0.2 10*3/uL (ref 0.1–1.0)
Monocytes Relative: 10 %
Neutro Abs: 0.9 10*3/uL — ABNORMAL LOW (ref 1.7–7.7)
Neutrophils Relative %: 39 %
Platelet Count: 295 10*3/uL (ref 150–400)
RBC: 3.11 MIL/uL — ABNORMAL LOW (ref 3.87–5.11)
RDW: 17.8 % — ABNORMAL HIGH (ref 11.5–15.5)
WBC Count: 2.3 10*3/uL — ABNORMAL LOW (ref 4.0–10.5)
nRBC: 0 % (ref 0.0–0.2)

## 2020-09-04 LAB — CMP (CANCER CENTER ONLY)
ALT: 33 U/L (ref 0–44)
AST: 43 U/L — ABNORMAL HIGH (ref 15–41)
Albumin: 3.8 g/dL (ref 3.5–5.0)
Alkaline Phosphatase: 49 U/L (ref 38–126)
Anion gap: 8 (ref 5–15)
BUN: 26 mg/dL — ABNORMAL HIGH (ref 8–23)
CO2: 26 mmol/L (ref 22–32)
Calcium: 9.9 mg/dL (ref 8.9–10.3)
Chloride: 100 mmol/L (ref 98–111)
Creatinine: 1.29 mg/dL — ABNORMAL HIGH (ref 0.44–1.00)
GFR, Estimated: 40 mL/min — ABNORMAL LOW (ref >60)
Glucose, Bld: 94 mg/dL (ref 70–99)
Potassium: 4.4 mmol/L (ref 3.5–5.1)
Sodium: 134 mmol/L — ABNORMAL LOW (ref 135–145)
Total Bilirubin: 0.4 mg/dL (ref 0.3–1.2)
Total Protein: 6.5 g/dL (ref 6.5–8.1)

## 2020-09-04 MED ORDER — PROCHLORPERAZINE MALEATE 10 MG PO TABS
10.0000 mg | ORAL_TABLET | Freq: Once | ORAL | Status: AC
  Administered 2020-09-04: 10 mg via ORAL

## 2020-09-04 MED ORDER — SODIUM CHLORIDE 0.9 % IV SOLN
Freq: Once | INTRAVENOUS | Status: AC
  Filled 2020-09-04: qty 250

## 2020-09-04 MED ORDER — PROCHLORPERAZINE MALEATE 10 MG PO TABS
ORAL_TABLET | ORAL | Status: AC
  Filled 2020-09-04: qty 1

## 2020-09-04 MED ORDER — SODIUM CHLORIDE 0.9% FLUSH
10.0000 mL | INTRAVENOUS | Status: DC | PRN
  Administered 2020-09-04: 10 mL
  Filled 2020-09-04: qty 10

## 2020-09-04 MED ORDER — HEPARIN SOD (PORK) LOCK FLUSH 100 UNIT/ML IV SOLN
500.0000 [IU] | Freq: Once | INTRAVENOUS | Status: AC | PRN
  Administered 2020-09-04: 500 [IU]
  Filled 2020-09-04: qty 5

## 2020-09-04 MED ORDER — SODIUM CHLORIDE 0.9 % IV SOLN
800.0000 mg/m2 | Freq: Once | INTRAVENOUS | Status: AC
  Administered 2020-09-04: 1368 mg via INTRAVENOUS
  Filled 2020-09-04: qty 35.98

## 2020-09-04 NOTE — Patient Instructions (Signed)
[facility] °Discharge Instructions for Patients Receiving Chemotherapy ° °Today you received the following chemotherapy agents Gemzar ° °To help prevent nausea and vomiting after your treatment, we encourage you to take your nausea medication as directed. °  °If you develop nausea and vomiting that is not controlled by your nausea medication, call the clinic.  ° °BELOW ARE SYMPTOMS THAT SHOULD BE REPORTED IMMEDIATELY: °· *FEVER GREATER THAN 100.5 F °· *CHILLS WITH OR WITHOUT FEVER °· NAUSEA AND VOMITING THAT IS NOT CONTROLLED WITH YOUR NAUSEA MEDICATION °· *UNUSUAL SHORTNESS OF BREATH °· *UNUSUAL BRUISING OR BLEEDING °· TENDERNESS IN MOUTH AND THROAT WITH OR WITHOUT PRESENCE OF ULCERS °· *URINARY PROBLEMS °· *BOWEL PROBLEMS °· UNUSUAL RASH °Items with * indicate a potential emergency and should be followed up as soon as possible. ° °Feel free to call the clinic should you have any questions or concerns. The clinic phone number is [phone]. ° °Please show the CHEMO ALERT CARD at check-in to the Emergency Department and triage nurse. ° ° °

## 2020-09-04 NOTE — Progress Notes (Signed)
Ok to treat with ANC 0.9 per Dr Alen Blew.    Pt tolerated treatment well.  Pt stable at discharge.  No complaints.  Ambulatory to lobby.

## 2020-09-11 ENCOUNTER — Ambulatory Visit: Payer: Medicare PPO

## 2020-09-11 ENCOUNTER — Ambulatory Visit: Payer: Medicare PPO | Admitting: Oncology

## 2020-09-11 ENCOUNTER — Other Ambulatory Visit: Payer: Medicare PPO

## 2020-09-17 ENCOUNTER — Telehealth: Payer: Self-pay | Admitting: Oncology

## 2020-09-17 NOTE — Telephone Encounter (Signed)
Called patient regarding upcoming appointments, patient is notified. 

## 2020-09-18 ENCOUNTER — Inpatient Hospital Stay: Payer: Medicare PPO

## 2020-09-18 ENCOUNTER — Ambulatory Visit: Payer: Medicare PPO

## 2020-09-18 ENCOUNTER — Other Ambulatory Visit: Payer: Medicare PPO

## 2020-09-18 ENCOUNTER — Inpatient Hospital Stay: Payer: Medicare PPO | Admitting: Oncology

## 2020-09-18 ENCOUNTER — Other Ambulatory Visit: Payer: Self-pay

## 2020-09-18 VITALS — BP 179/68 | HR 66 | Temp 97.6°F | Resp 18 | Ht 65.0 in | Wt 154.9 lb

## 2020-09-18 DIAGNOSIS — C649 Malignant neoplasm of unspecified kidney, except renal pelvis: Secondary | ICD-10-CM

## 2020-09-18 DIAGNOSIS — C641 Malignant neoplasm of right kidney, except renal pelvis: Secondary | ICD-10-CM

## 2020-09-18 DIAGNOSIS — C651 Malignant neoplasm of right renal pelvis: Secondary | ICD-10-CM | POA: Diagnosis not present

## 2020-09-18 DIAGNOSIS — Z5111 Encounter for antineoplastic chemotherapy: Secondary | ICD-10-CM | POA: Diagnosis not present

## 2020-09-18 DIAGNOSIS — Z95828 Presence of other vascular implants and grafts: Secondary | ICD-10-CM

## 2020-09-18 LAB — CBC WITH DIFFERENTIAL (CANCER CENTER ONLY)
Abs Immature Granulocytes: 0 10*3/uL (ref 0.00–0.07)
Basophils Absolute: 0 10*3/uL (ref 0.0–0.1)
Basophils Relative: 0 %
Eosinophils Absolute: 0.1 10*3/uL (ref 0.0–0.5)
Eosinophils Relative: 2 %
HCT: 28.6 % — ABNORMAL LOW (ref 36.0–46.0)
Hemoglobin: 9.1 g/dL — ABNORMAL LOW (ref 12.0–15.0)
Immature Granulocytes: 0 %
Lymphocytes Relative: 41 %
Lymphs Abs: 1.1 10*3/uL (ref 0.7–4.0)
MCH: 31 pg (ref 26.0–34.0)
MCHC: 31.8 g/dL (ref 30.0–36.0)
MCV: 97.3 fL (ref 80.0–100.0)
Monocytes Absolute: 0.6 10*3/uL (ref 0.1–1.0)
Monocytes Relative: 23 %
Neutro Abs: 0.9 10*3/uL — ABNORMAL LOW (ref 1.7–7.7)
Neutrophils Relative %: 34 %
Platelet Count: 201 10*3/uL (ref 150–400)
RBC: 2.94 MIL/uL — ABNORMAL LOW (ref 3.87–5.11)
RDW: 19.9 % — ABNORMAL HIGH (ref 11.5–15.5)
WBC Count: 2.8 10*3/uL — ABNORMAL LOW (ref 4.0–10.5)
nRBC: 0 % (ref 0.0–0.2)

## 2020-09-18 LAB — CMP (CANCER CENTER ONLY)
ALT: 22 U/L (ref 0–44)
AST: 27 U/L (ref 15–41)
Albumin: 3.7 g/dL (ref 3.5–5.0)
Alkaline Phosphatase: 54 U/L (ref 38–126)
Anion gap: 8 (ref 5–15)
BUN: 20 mg/dL (ref 8–23)
CO2: 25 mmol/L (ref 22–32)
Calcium: 9.6 mg/dL (ref 8.9–10.3)
Chloride: 100 mmol/L (ref 98–111)
Creatinine: 1.19 mg/dL — ABNORMAL HIGH (ref 0.44–1.00)
GFR, Estimated: 44 mL/min — ABNORMAL LOW (ref >60)
Glucose, Bld: 82 mg/dL (ref 70–99)
Potassium: 4.5 mmol/L (ref 3.5–5.1)
Sodium: 133 mmol/L — ABNORMAL LOW (ref 135–145)
Total Bilirubin: 0.3 mg/dL (ref 0.3–1.2)
Total Protein: 6.3 g/dL — ABNORMAL LOW (ref 6.5–8.1)

## 2020-09-18 MED ORDER — PALONOSETRON HCL INJECTION 0.25 MG/5ML
INTRAVENOUS | Status: AC
  Filled 2020-09-18: qty 5

## 2020-09-18 MED ORDER — SODIUM CHLORIDE 0.9 % IV SOLN
800.0000 mg/m2 | Freq: Once | INTRAVENOUS | Status: AC
  Administered 2020-09-18: 1368 mg via INTRAVENOUS
  Filled 2020-09-18: qty 35.98

## 2020-09-18 MED ORDER — SODIUM CHLORIDE 0.9 % IV SOLN
Freq: Once | INTRAVENOUS | Status: AC
  Filled 2020-09-18: qty 250

## 2020-09-18 MED ORDER — SODIUM CHLORIDE 0.9 % IV SOLN
240.0000 mg | Freq: Once | INTRAVENOUS | Status: AC
  Administered 2020-09-18: 240 mg via INTRAVENOUS
  Filled 2020-09-18: qty 24

## 2020-09-18 MED ORDER — SODIUM CHLORIDE 0.9% FLUSH
10.0000 mL | INTRAVENOUS | Status: DC | PRN
  Administered 2020-09-18: 10 mL
  Filled 2020-09-18: qty 10

## 2020-09-18 MED ORDER — PALONOSETRON HCL INJECTION 0.25 MG/5ML
0.2500 mg | Freq: Once | INTRAVENOUS | Status: AC
  Administered 2020-09-18: 0.25 mg via INTRAVENOUS

## 2020-09-18 MED ORDER — SODIUM CHLORIDE 0.9% FLUSH
10.0000 mL | Freq: Once | INTRAVENOUS | Status: AC
  Administered 2020-09-18: 10 mL
  Filled 2020-09-18: qty 10

## 2020-09-18 MED ORDER — HEPARIN SOD (PORK) LOCK FLUSH 100 UNIT/ML IV SOLN
500.0000 [IU] | Freq: Once | INTRAVENOUS | Status: AC | PRN
  Administered 2020-09-18: 500 [IU]
  Filled 2020-09-18: qty 5

## 2020-09-18 MED ORDER — SODIUM CHLORIDE 0.9 % IV SOLN
10.0000 mg | Freq: Once | INTRAVENOUS | Status: AC
  Administered 2020-09-18: 10 mg via INTRAVENOUS
  Filled 2020-09-18: qty 10

## 2020-09-18 MED ORDER — SODIUM CHLORIDE 0.9 % IV SOLN
150.0000 mg | Freq: Once | INTRAVENOUS | Status: AC
  Administered 2020-09-18: 150 mg via INTRAVENOUS
  Filled 2020-09-18: qty 150

## 2020-09-18 NOTE — Progress Notes (Signed)
Pt labs resulted today and ANC is 0.9, ok to treat per Alen Blew, MD.

## 2020-09-18 NOTE — Progress Notes (Signed)
Hematology and Oncology Follow Up Visit  Kayla Bowers [id number] May 20, 1929 86 y.o. 09/18/2020 9:10 AM Kayla Bowers, MDHodges, [name], MD   Principle Diagnosis: 86 year old woman with T3N2 high-grade urothelial carcinoma of the right renal pelvis diagnosed in August 2021.    Prior Therapy:  He is status post robotic assisted right nephro ureterectomy and right adrenalectomy completed by Dr. Lovena Bowers on May 21, 2020.  Current therapy: Adjuvant chemotherapy utilizing carboplatin and gemcitabine started on July 11, 2020.  Day 1 of cycle 4 of therapy.  Interim History: Kayla Bowers presents today for a follow-up visit.  Since her last visit, she reports no major changes in her health.  She has completed a course of rifampin for a resistant UTI.  She denies any fevers, chills or hematuria.  Her performance status quality of life remained excellent.  She denies any recent hospitalization or illnesses.  She denies any nausea, vomiting or diarrhea.  She denies any chemotherapy related issues.     Medications: Updated on review. Current Outpatient Medications  Medication Sig Dispense Refill  . amLODipine (NORVASC) 5 MG tablet     . ampicillin (PRINCIPEN) 500 MG capsule Take 1 capsule (500 mg total) by mouth 3 (three) times daily. 15 capsule 0  . docusate sodium (COLACE) 100 MG capsule Take 1 capsule (100 mg total) by mouth 2 (two) times daily. (Patient not taking: Reported on 06/27/2020)    . fenofibrate 160 MG tablet Take 160 mg by mouth at bedtime.    . gabapentin (NEURONTIN) 100 MG capsule     . hydrochlorothiazide (MICROZIDE) 12.5 MG capsule Take 12.5 mg by mouth daily.     Marland Kitchen HYDROcodone-acetaminophen (NORCO) 5-325 MG tablet Take 1-2 tablets by mouth every 6 (six) hours as needed for moderate pain. (Patient not taking: Reported on 06/27/2020) 20 tablet 0  . lidocaine-prilocaine (EMLA) cream Apply 1 application topically as needed. 30 g 0  . Multiple Vitamins-Minerals (PRESERVISION AREDS  PO) Take 1 capsule by mouth daily.    . phenazopyridine (PYRIDIUM) 95 MG tablet Take 1 tablet (95 mg total) by mouth 3 (three) times daily as needed for pain. 10 tablet 0  . prochlorperazine (COMPAZINE) 10 MG tablet Take 1 tablet (10 mg total) by mouth every 6 (six) hours as needed for nausea or vomiting. 30 tablet 0  . promethazine (PHENERGAN) 12.5 MG tablet Take 1 tablet (12.5 mg total) by mouth every 4 (four) hours as needed for nausea or vomiting. (Patient not taking: Reported on 06/27/2020) 15 tablet 0   No current facility-administered medications for this visit.     Allergies:  Allergies  Allergen Reactions  . Flomax [Tamsulosin Hcl] Nausea Only     Physical Exam:  Blood pressure (!) 179/68, pulse 66, temperature 97.6 F (36.4 C), temperature source Tympanic, resp. rate 18, height 5\' 5"  (1.651 m), weight 154 lb 14.4 oz (70.3 kg), SpO2 100 %.    ECOG: 1     General appearance: Alert, awake without any distress. Head: Atraumatic without abnormalities Oropharynx: Without any thrush or ulcers. Eyes: No scleral icterus. Lymph nodes: No lymphadenopathy noted in the cervical, supraclavicular, or axillary nodes Heart:regular rate and rhythm, without any murmurs or gallops.   Lung: Clear to auscultation without any rhonchi, wheezes or dullness to percussion. Abdomin: Soft, nontender without any shifting dullness or ascites. Musculoskeletal: No clubbing or cyanosis. Neurological: No motor or sensory deficits. Skin: No rashes or lesions.         Lab Results: Lab Results  Component  Value Date   WBC 2.8 (L) 09/18/2020   HGB 9.1 (L) 09/18/2020   HCT 28.6 (L) 09/18/2020   MCV 97.3 09/18/2020   PLT 201 09/18/2020     Chemistry      Component Value Date/Time   NA 134 (L) 09/04/2020 0850   K 4.4 09/04/2020 0850   CL 100 09/04/2020 0850   CO2 26 09/04/2020 0850   BUN 26 (H) 09/04/2020 0850   CREATININE 1.29 (H) 09/04/2020 0850      Component Value Date/Time    CALCIUM 9.9 09/04/2020 0850   ALKPHOS 49 09/04/2020 0850   AST 43 (H) 09/04/2020 0850   ALT 33 09/04/2020 0850   BILITOT 0.4 09/04/2020 0850       Impression and Plan:   86 year old woman with:  1.    T3N2 high-grade urothelial carcinoma of the right renal pelvis.  He is status post surgical resection.  She is currently receiving adjuvant chemotherapy without any major complications.  Risks and benefits of proceeding with chemotherapy were discussed today.  Potential complications with nausea, vomiting, suppression and bleeding were reviewed.  Plan is to complete cycle 4 of chemotherapy with day 1 only and will cancel day 8 because of pancytopenia.  Upon completing this chemotherapy cycle today she will return for a repeat imaging studies in 4 weeks.   2. IV access:Port-A-Cath will continue to be in place for the time being and will be flushed periodically.  Port-A-Cath removal will be considered in the future.  3. Antiemetics: Compazine is available to her without any nausea or vomiting.  4. Renal function surveillance:Kidney function remained stable and will continue to monitor on platinum based therapy.  5. Goals of care:Aggressive measures are warranted given her excellent performance status.  6.  Urinary tract infection: Symptoms resolved at this time.  She has completed course of rifampin.  7. Follow-up: She will return in 4 to 6 weeks for repeat imaging studies.  30  minutes were spent on this encounter.  Time was dedicated to reviewing her disease status, reviewing laboratory data and future plan of care discussion.  Kayla Button, MD 12/23/20219:10 AM

## 2020-09-18 NOTE — Progress Notes (Signed)
Maintain today's dose of carboplatin at 240 mg despite improvement in CrCl.  ANC 0.9 with ok to treat.  T.O. Dr Creta Levin, PharmD

## 2020-09-18 NOTE — Patient Instructions (Signed)
[facility] Discharge Instructions for Patients Receiving Chemotherapy  Today you received the following chemotherapy agents Gemzar and Carboplatin   To help prevent nausea and vomiting after your treatment, we encourage you to take your nausea medication as directed.    If you develop nausea and vomiting that is not controlled by your nausea medication, call the clinic.   BELOW ARE SYMPTOMS THAT SHOULD BE REPORTED IMMEDIATELY:  *FEVER GREATER THAN 100.5 F  *CHILLS WITH OR WITHOUT FEVER  NAUSEA AND VOMITING THAT IS NOT CONTROLLED WITH YOUR NAUSEA MEDICATION  *UNUSUAL SHORTNESS OF BREATH  *UNUSUAL BRUISING OR BLEEDING  TENDERNESS IN MOUTH AND THROAT WITH OR WITHOUT PRESENCE OF ULCERS  *URINARY PROBLEMS  *BOWEL PROBLEMS  UNUSUAL RASH Items with * indicate a potential emergency and should be followed up as soon as possible.  Feel free to call the clinic should you have any questions or concerns. The clinic phone number is [phone].  Please show the CHEMO ALERT CARD at check-in to the Emergency Department and triage nurse.   

## 2020-09-20 DIAGNOSIS — Z86718 Personal history of other venous thrombosis and embolism: Secondary | ICD-10-CM | POA: Diagnosis not present

## 2020-09-20 DIAGNOSIS — I700 Atherosclerosis of aorta: Secondary | ICD-10-CM | POA: Diagnosis not present

## 2020-09-20 DIAGNOSIS — I447 Left bundle-branch block, unspecified: Secondary | ICD-10-CM | POA: Diagnosis not present

## 2020-09-20 DIAGNOSIS — I517 Cardiomegaly: Secondary | ICD-10-CM | POA: Diagnosis not present

## 2020-09-20 DIAGNOSIS — Z79899 Other long term (current) drug therapy: Secondary | ICD-10-CM | POA: Diagnosis not present

## 2020-09-20 DIAGNOSIS — I10 Essential (primary) hypertension: Secondary | ICD-10-CM | POA: Diagnosis not present

## 2020-09-20 DIAGNOSIS — M79662 Pain in left lower leg: Secondary | ICD-10-CM | POA: Diagnosis not present

## 2020-09-20 DIAGNOSIS — R600 Localized edema: Secondary | ICD-10-CM | POA: Diagnosis not present

## 2020-09-20 DIAGNOSIS — M7989 Other specified soft tissue disorders: Secondary | ICD-10-CM | POA: Diagnosis not present

## 2020-09-20 DIAGNOSIS — M1611 Unilateral primary osteoarthritis, right hip: Secondary | ICD-10-CM | POA: Diagnosis not present

## 2020-09-24 ENCOUNTER — Telehealth: Payer: Self-pay | Admitting: Oncology

## 2020-09-24 NOTE — Telephone Encounter (Signed)
Scheduled per 12/23 los, patient has been called and notified. 

## 2020-09-25 ENCOUNTER — Other Ambulatory Visit: Payer: Medicare PPO

## 2020-09-25 ENCOUNTER — Ambulatory Visit: Payer: Medicare PPO

## 2020-09-27 DIAGNOSIS — N183 Chronic kidney disease, stage 3 unspecified: Secondary | ICD-10-CM | POA: Diagnosis not present

## 2020-09-27 DIAGNOSIS — I129 Hypertensive chronic kidney disease with stage 1 through stage 4 chronic kidney disease, or unspecified chronic kidney disease: Secondary | ICD-10-CM | POA: Diagnosis not present

## 2020-09-27 DIAGNOSIS — E782 Mixed hyperlipidemia: Secondary | ICD-10-CM | POA: Diagnosis not present

## 2020-10-03 DIAGNOSIS — C679 Malignant neoplasm of bladder, unspecified: Secondary | ICD-10-CM | POA: Diagnosis not present

## 2020-10-03 DIAGNOSIS — R609 Edema, unspecified: Secondary | ICD-10-CM | POA: Diagnosis not present

## 2020-10-03 DIAGNOSIS — E278 Other specified disorders of adrenal gland: Secondary | ICD-10-CM | POA: Diagnosis not present

## 2020-10-03 DIAGNOSIS — Z7189 Other specified counseling: Secondary | ICD-10-CM | POA: Diagnosis not present

## 2020-10-03 DIAGNOSIS — G622 Polyneuropathy due to other toxic agents: Secondary | ICD-10-CM | POA: Diagnosis not present

## 2020-10-16 ENCOUNTER — Other Ambulatory Visit: Payer: Medicare PPO

## 2020-10-16 ENCOUNTER — Other Ambulatory Visit: Payer: Self-pay

## 2020-10-16 ENCOUNTER — Inpatient Hospital Stay: Payer: Medicare PPO | Attending: Oncology

## 2020-10-16 ENCOUNTER — Inpatient Hospital Stay: Payer: Medicare PPO

## 2020-10-16 DIAGNOSIS — Z9221 Personal history of antineoplastic chemotherapy: Secondary | ICD-10-CM | POA: Insufficient documentation

## 2020-10-16 DIAGNOSIS — Z79899 Other long term (current) drug therapy: Secondary | ICD-10-CM | POA: Diagnosis not present

## 2020-10-16 DIAGNOSIS — K8020 Calculus of gallbladder without cholecystitis without obstruction: Secondary | ICD-10-CM | POA: Insufficient documentation

## 2020-10-16 DIAGNOSIS — Z95828 Presence of other vascular implants and grafts: Secondary | ICD-10-CM

## 2020-10-16 DIAGNOSIS — C679 Malignant neoplasm of bladder, unspecified: Secondary | ICD-10-CM | POA: Diagnosis not present

## 2020-10-16 DIAGNOSIS — C651 Malignant neoplasm of right renal pelvis: Secondary | ICD-10-CM | POA: Diagnosis not present

## 2020-10-16 DIAGNOSIS — I700 Atherosclerosis of aorta: Secondary | ICD-10-CM | POA: Insufficient documentation

## 2020-10-16 DIAGNOSIS — Z8553 Personal history of malignant neoplasm of renal pelvis: Secondary | ICD-10-CM | POA: Insufficient documentation

## 2020-10-16 DIAGNOSIS — C674 Malignant neoplasm of posterior wall of bladder: Secondary | ICD-10-CM | POA: Diagnosis not present

## 2020-10-16 DIAGNOSIS — C641 Malignant neoplasm of right kidney, except renal pelvis: Secondary | ICD-10-CM

## 2020-10-16 LAB — CBC WITH DIFFERENTIAL (CANCER CENTER ONLY)
Abs Immature Granulocytes: 0.01 10*3/uL (ref 0.00–0.07)
Basophils Absolute: 0 10*3/uL (ref 0.0–0.1)
Basophils Relative: 1 %
Eosinophils Absolute: 0.1 10*3/uL (ref 0.0–0.5)
Eosinophils Relative: 2 %
HCT: 32.6 % — ABNORMAL LOW (ref 36.0–46.0)
Hemoglobin: 10.8 g/dL — ABNORMAL LOW (ref 12.0–15.0)
Immature Granulocytes: 0 %
Lymphocytes Relative: 40 %
Lymphs Abs: 1.2 10*3/uL (ref 0.7–4.0)
MCH: 32.2 pg (ref 26.0–34.0)
MCHC: 33.1 g/dL (ref 30.0–36.0)
MCV: 97.3 fL (ref 80.0–100.0)
Monocytes Absolute: 0.5 10*3/uL (ref 0.1–1.0)
Monocytes Relative: 18 %
Neutro Abs: 1.1 10*3/uL — ABNORMAL LOW (ref 1.7–7.7)
Neutrophils Relative %: 39 %
Platelet Count: 139 10*3/uL — ABNORMAL LOW (ref 150–400)
RBC: 3.35 MIL/uL — ABNORMAL LOW (ref 3.87–5.11)
RDW: 16.7 % — ABNORMAL HIGH (ref 11.5–15.5)
WBC Count: 2.9 10*3/uL — ABNORMAL LOW (ref 4.0–10.5)
nRBC: 0 % (ref 0.0–0.2)

## 2020-10-16 LAB — CMP (CANCER CENTER ONLY)
ALT: 12 U/L (ref 0–44)
AST: 23 U/L (ref 15–41)
Albumin: 4 g/dL (ref 3.5–5.0)
Alkaline Phosphatase: 56 U/L (ref 38–126)
Anion gap: 8 (ref 5–15)
BUN: 24 mg/dL — ABNORMAL HIGH (ref 8–23)
CO2: 27 mmol/L (ref 22–32)
Calcium: 10 mg/dL (ref 8.9–10.3)
Chloride: 98 mmol/L (ref 98–111)
Creatinine: 1.38 mg/dL — ABNORMAL HIGH (ref 0.44–1.00)
GFR, Estimated: 37 mL/min — ABNORMAL LOW (ref >60)
Glucose, Bld: 100 mg/dL — ABNORMAL HIGH (ref 70–99)
Potassium: 4.2 mmol/L (ref 3.5–5.1)
Sodium: 133 mmol/L — ABNORMAL LOW (ref 135–145)
Total Bilirubin: 0.4 mg/dL (ref 0.3–1.2)
Total Protein: 6.7 g/dL (ref 6.5–8.1)

## 2020-10-16 MED ORDER — SODIUM CHLORIDE 0.9% FLUSH
10.0000 mL | Freq: Once | INTRAVENOUS | Status: AC
  Administered 2020-10-16: 10 mL
  Filled 2020-10-16: qty 10

## 2020-10-16 MED ORDER — HEPARIN SOD (PORK) LOCK FLUSH 100 UNIT/ML IV SOLN
500.0000 [IU] | Freq: Once | INTRAVENOUS | Status: AC
  Administered 2020-10-16: 500 [IU] via INTRAVENOUS
  Filled 2020-10-16: qty 5

## 2020-10-17 ENCOUNTER — Other Ambulatory Visit: Payer: Medicare PPO

## 2020-10-17 ENCOUNTER — Ambulatory Visit (HOSPITAL_COMMUNITY)
Admission: RE | Admit: 2020-10-17 | Discharge: 2020-10-17 | Disposition: A | Discharge status: Home / Self Care | Payer: Medicare PPO | Source: Ambulatory Visit | Attending: Oncology | Admitting: Oncology

## 2020-10-17 DIAGNOSIS — I700 Atherosclerosis of aorta: Secondary | ICD-10-CM | POA: Diagnosis not present

## 2020-10-17 DIAGNOSIS — C649 Malignant neoplasm of unspecified kidney, except renal pelvis: Secondary | ICD-10-CM | POA: Diagnosis not present

## 2020-10-17 DIAGNOSIS — K8020 Calculus of gallbladder without cholecystitis without obstruction: Secondary | ICD-10-CM | POA: Diagnosis not present

## 2020-10-17 DIAGNOSIS — C651 Malignant neoplasm of right renal pelvis: Secondary | ICD-10-CM | POA: Diagnosis not present

## 2020-10-17 DIAGNOSIS — J439 Emphysema, unspecified: Secondary | ICD-10-CM | POA: Diagnosis not present

## 2020-10-17 LAB — GLUCOSE, CAPILLARY: Glucose-Capillary: 93 mg/dL (ref 70–99)

## 2020-10-17 MED ORDER — FLUDEOXYGLUCOSE F - 18 (FDG) INJECTION
7.6800 | Freq: Once | INTRAVENOUS | Status: AC | PRN
  Administered 2020-10-17: 7.68 via INTRAVENOUS

## 2020-10-23 ENCOUNTER — Other Ambulatory Visit: Payer: Self-pay

## 2020-10-23 ENCOUNTER — Inpatient Hospital Stay (HOSPITAL_BASED_OUTPATIENT_CLINIC_OR_DEPARTMENT_OTHER): Payer: Medicare PPO | Admitting: Oncology

## 2020-10-23 VITALS — BP 176/69 | HR 74 | Temp 98.1°F | Resp 18 | Ht 65.0 in | Wt 153.0 lb

## 2020-10-23 DIAGNOSIS — I700 Atherosclerosis of aorta: Secondary | ICD-10-CM | POA: Diagnosis not present

## 2020-10-23 DIAGNOSIS — C649 Malignant neoplasm of unspecified kidney, except renal pelvis: Secondary | ICD-10-CM | POA: Diagnosis not present

## 2020-10-23 DIAGNOSIS — Z8553 Personal history of malignant neoplasm of renal pelvis: Secondary | ICD-10-CM | POA: Diagnosis not present

## 2020-10-23 DIAGNOSIS — Z95828 Presence of other vascular implants and grafts: Secondary | ICD-10-CM | POA: Diagnosis not present

## 2020-10-23 DIAGNOSIS — K8020 Calculus of gallbladder without cholecystitis without obstruction: Secondary | ICD-10-CM | POA: Diagnosis not present

## 2020-10-23 DIAGNOSIS — Z9221 Personal history of antineoplastic chemotherapy: Secondary | ICD-10-CM | POA: Diagnosis not present

## 2020-10-23 DIAGNOSIS — Z79899 Other long term (current) drug therapy: Secondary | ICD-10-CM | POA: Diagnosis not present

## 2020-10-23 NOTE — Progress Notes (Signed)
Hematology and Oncology Follow Up Visit  Kayla Bowers [id number] 1929-05-29 87 y.o. 10/23/2020 3:17 PM Kayla Bowers, MDHodges, [name], MD   Principle Diagnosis: 87 year old woman with renal pelvis cancer diagnosed in August 2021.  She was found to have T3N2 high-grade urothelial carcinoma without any evidence of metastatic disease.    Prior Therapy:  He is status post robotic assisted right nephro ureterectomy and right adrenalectomy completed by Dr. Lovena Neighbours on May 21, 2020.  Adjuvant chemotherapy utilizing carboplatin and gemcitabine started on July 11, 2020.  She completed 4 cycles of therapy in December 2021.  Current therapy: Active surveillance  Interim History: [name] is here for repeat evaluation.  Since the last visit, she reports no major changes in her health.  She denies any delayed complications related to chemotherapy.  She denies any nausea, vomiting or abdominal pain.  She denies any hematuria or dysuria.     Medications: Unchanged on review. Current Outpatient Medications  Medication Sig Dispense Refill  . amLODipine (NORVASC) 5 MG tablet     . ampicillin (PRINCIPEN) 500 MG capsule Take 1 capsule (500 mg total) by mouth 3 (three) times daily. 15 capsule 0  . docusate sodium (COLACE) 100 MG capsule Take 1 capsule (100 mg total) by mouth 2 (two) times daily. (Patient not taking: Reported on 06/27/2020)    . fenofibrate 160 MG tablet Take 160 mg by mouth at bedtime.    . gabapentin (NEURONTIN) 100 MG capsule     . hydrochlorothiazide (MICROZIDE) 12.5 MG capsule Take 12.5 mg by mouth daily.     Marland Kitchen HYDROcodone-acetaminophen (NORCO) 5-325 MG tablet Take 1-2 tablets by mouth every 6 (six) hours as needed for moderate pain. (Patient not taking: Reported on 06/27/2020) 20 tablet 0  . lidocaine-prilocaine (EMLA) cream Apply 1 application topically as needed. 30 g 0  . Multiple Vitamins-Minerals (PRESERVISION AREDS PO) Take 1 capsule by mouth daily.    .  phenazopyridine (PYRIDIUM) 95 MG tablet Take 1 tablet (95 mg total) by mouth 3 (three) times daily as needed for pain. 10 tablet 0  . prochlorperazine (COMPAZINE) 10 MG tablet Take 1 tablet (10 mg total) by mouth every 6 (six) hours as needed for nausea or vomiting. 30 tablet 0  . promethazine (PHENERGAN) 12.5 MG tablet Take 1 tablet (12.5 mg total) by mouth every 4 (four) hours as needed for nausea or vomiting. (Patient not taking: Reported on 06/27/2020) 15 tablet 0   No current facility-administered medications for this visit.     Allergies:  Allergies  Allergen Reactions  . Flomax [Tamsulosin Hcl] Nausea Only     Physical Exam:   Blood pressure (!) 176/69, pulse 74, temperature 98.1 F (36.7 C), temperature source Tympanic, resp. rate 18, height 5\' 5"  (1.651 m), weight 153 lb (69.4 kg), SpO2 100 %.    ECOG: 1    General appearance: Comfortable appearing without any discomfort Head: Normocephalic without any trauma Oropharynx: Mucous membranes are moist and pink without any thrush or ulcers. Eyes: Pupils are equal and round reactive to light. Lymph nodes: No cervical, supraclavicular, inguinal or axillary lymphadenopathy.   Heart:regular rate and rhythm.  S1 and S2 without leg edema. Lung: Clear without any rhonchi or wheezes.  No dullness to percussion. Abdomin: Soft, nontender, nondistended with good bowel sounds.  No hepatosplenomegaly. Musculoskeletal: No joint deformity or effusion.  Full range of motion noted. Neurological: No deficits noted on motor, sensory and deep tendon reflex exam. Skin: No petechial rash or dryness.  Appeared  moist.           Lab Results: Lab Results  Component Value Date   WBC 2.9 (L) 10/16/2020   HGB 10.8 (L) 10/16/2020   HCT 32.6 (L) 10/16/2020   MCV 97.3 10/16/2020   PLT 139 (L) 10/16/2020     Chemistry      Component Value Date/Time   NA 133 (L) 10/16/2020 0953   K 4.2 10/16/2020 0953   CL 98 10/16/2020 0953   CO2 27  10/16/2020 0953   BUN 24 (H) 10/16/2020 0953   CREATININE 1.38 (H) 10/16/2020 0953      Component Value Date/Time   CALCIUM 10.0 10/16/2020 0953   ALKPHOS 56 10/16/2020 0953   AST 23 10/16/2020 0953   ALT 12 10/16/2020 0953   BILITOT 0.4 10/16/2020 0953     IMPRESSION: 1. No findings today to suggest hypermetabolic recurrent or metastatic disease in the neck, chest, abdomen, or pelvis. 2. Tiny subcarinal lymph node with mild FDG accumulation, indeterminate and potentially reactive. Attention on follow-up recommended. 3. Hypermetabolic paraspinal brown fat in the lower neck and upper thorax. 4. Cholelithiasis. 5. 2 cm left adrenal nodule compatible with adenoma. 6.  Aortic Atherosclerois (ICD10-170.0) Emphysema. (ICD10-J43.9  Impression and Plan:   87 year old woman with:  1.    Renal pelvis cancer diagnosed in August 2021.  She was found to have T3N2 high-grade urothelial carcinoma on the right side.   The natural course of her disease was reviewed at this time.  PET scan obtained on October 17, 2020 was personally reviewed and discussed with the patient today.  The scan did not show any evidence of metastatic disease or any need for any additional therapy.  At this time, I recommended active surveillance and reinstituting therapy only if she develops metastatic disease in the future.  I recommend repeat imaging studies in 6 months.  She will have scans scheduled by Dr. Lovena Neighbours at that time.   2. IV access:Port-A-Cath remains in place and flushed periodically.  Risks and benefits of keeping the port for the time being were discussed.    3. Renal function surveillance:Kidney function remains overall stable after completion of chemotherapy.    4. Follow-up: In 2 months and 4 months for a Port-A-Cath flush and in 6 months we will repeat evaluation after imaging studies.  30  minutes were dedicated to this visit.  Time spent on reviewing imaging studies,  laboratory data, treatment options and future plan of care reviewed.   [name], MD 1/27/20223:17 PM

## 2020-10-28 DIAGNOSIS — I700 Atherosclerosis of aorta: Secondary | ICD-10-CM | POA: Diagnosis not present

## 2020-10-28 DIAGNOSIS — E782 Mixed hyperlipidemia: Secondary | ICD-10-CM | POA: Diagnosis not present

## 2020-10-28 DIAGNOSIS — I129 Hypertensive chronic kidney disease with stage 1 through stage 4 chronic kidney disease, or unspecified chronic kidney disease: Secondary | ICD-10-CM | POA: Diagnosis not present

## 2020-10-28 DIAGNOSIS — N1831 Chronic kidney disease, stage 3a: Secondary | ICD-10-CM | POA: Diagnosis not present

## 2020-10-29 ENCOUNTER — Telehealth: Payer: Self-pay | Admitting: Cardiology

## 2020-10-29 NOTE — Telephone Encounter (Signed)
   Primary Cardiologist: Berniece Salines, DO  Chart reviewed and patient contacted today by phone as part of pre-operative protocol coverage. Given past medical history and time since last visit, based on ACC/AHA guidelines, Kayla Bowers would be at acceptable risk for the planned procedure without further cardiovascular testing.   The patient was advised that if she develops new symptoms prior to surgery to contact our office to arrange for a follow-up visit, and she verbalized understanding.  I will route this recommendation to the requesting party via Epic fax function and remove from pre-op pool.  Please call with questions.  Kerin Ransom, PA-C 10/29/2020, 2:14 PM

## 2020-10-29 NOTE — Telephone Encounter (Signed)
   [facility] Pre-operative Risk Assessment    Request for surgical clearance:  1. What type of surgery is being performed? Cystoscopy with Bladder Biopsy & Fulguration    2. When is this surgery scheduled? TBD   3. What type of clearance is required (medical clearance vs. Pharmacy clearance to hold med vs. Both)? Medical Clearance  4. Are there any medications that need to be held prior to surgery and how long? No    5. Practice name and name of physician performing surgery? [facility]  Dr. Harrell Gave Lovena Neighbours   6. What is your office phone number? [phone] ([phone]: [phone])    7.   What is your office fax number? [phone]  8.   Anesthesia type (None, local, MAC, general) ? General    Zara Council 10/29/2020, 1:19 PM  _________________________________________________________________

## 2020-10-31 ENCOUNTER — Other Ambulatory Visit: Payer: Self-pay | Admitting: Urology

## 2020-11-03 ENCOUNTER — Encounter (HOSPITAL_BASED_OUTPATIENT_CLINIC_OR_DEPARTMENT_OTHER): Payer: Self-pay | Admitting: Urology

## 2020-11-03 ENCOUNTER — Other Ambulatory Visit: Payer: Self-pay

## 2020-11-03 ENCOUNTER — Other Ambulatory Visit (HOSPITAL_COMMUNITY)
Admission: RE | Admit: 2020-11-03 | Discharge: 2020-11-03 | Disposition: A | Discharge status: Home / Self Care | Payer: Medicare PPO | Source: Ambulatory Visit | Attending: Urology | Admitting: Urology

## 2020-11-03 DIAGNOSIS — Z20822 Contact with and (suspected) exposure to covid-19: Secondary | ICD-10-CM | POA: Insufficient documentation

## 2020-11-03 DIAGNOSIS — Z01812 Encounter for preprocedural laboratory examination: Secondary | ICD-10-CM | POA: Diagnosis not present

## 2020-11-03 NOTE — Progress Notes (Addendum)
Spoke w/ via phone for pre-op interview---pt Lab needs dos---- I stat 8               Lab results------ekg 04-30-2020 epic, has cardiac clearance luke kilroy pa 11-18-2020 epic, Tonita Cong 04-30-2020 epic COVID test ------11-03-2020 1100 Arrive at -------1015 am 11-05-2020 NPO after MN NO Solid Food.  Clear liquids from MN until---915 am then npo Medications to take morning of surgery -----gabapentin, amlodipine Diabetic medication -----n/a Patient Special Instructions -----none Pre-Op special Istructions -----none Patient verbalized understanding of instructions that were given at this phone interview. Patient denies shortness of breath, chest pain, fever, cough at this phone interview.  Requested d/c summary, ekg chest ray legs ultrasound done at [facility] hospital 09-20-2020 via fa confirmation of fax received.  ADDENDUM: RECEIVED ED VISIT SUMMARY FROM 09-20-2020, EKG 09-20-2020, CHEST XRAY 1 VIEW 09-20-2020 BILATERAL LOWER EXTREMITY VENOUS DOPPLER ULTRASOUND 09-20-2020 ALL DONE AT [place] PLACED ON PT CHART.

## 2020-11-04 LAB — SARS CORONAVIRUS 2 (TAT 6-24 HRS): SARS Coronavirus 2: NEGATIVE

## 2020-11-05 ENCOUNTER — Encounter (HOSPITAL_BASED_OUTPATIENT_CLINIC_OR_DEPARTMENT_OTHER): Payer: Self-pay | Admitting: Urology

## 2020-11-05 ENCOUNTER — Other Ambulatory Visit: Payer: Self-pay

## 2020-11-05 ENCOUNTER — Encounter (HOSPITAL_BASED_OUTPATIENT_CLINIC_OR_DEPARTMENT_OTHER)
Admission: RE | Disposition: A | Discharge status: Home / Self Care | Payer: Self-pay | Source: Ambulatory Visit | Attending: Urology

## 2020-11-05 ENCOUNTER — Ambulatory Visit (HOSPITAL_BASED_OUTPATIENT_CLINIC_OR_DEPARTMENT_OTHER)
Admission: RE | Admit: 2020-11-05 | Discharge: 2020-11-05 | Disposition: A | Discharge status: Home / Self Care | Payer: Medicare PPO | Source: Ambulatory Visit | Attending: Urology | Admitting: Urology

## 2020-11-05 ENCOUNTER — Ambulatory Visit (HOSPITAL_BASED_OUTPATIENT_CLINIC_OR_DEPARTMENT_OTHER): Payer: Medicare PPO | Admitting: Certified Registered Nurse Anesthetist

## 2020-11-05 DIAGNOSIS — N329 Bladder disorder, unspecified: Secondary | ICD-10-CM | POA: Insufficient documentation

## 2020-11-05 DIAGNOSIS — Z8551 Personal history of malignant neoplasm of bladder: Secondary | ICD-10-CM | POA: Diagnosis not present

## 2020-11-05 DIAGNOSIS — C651 Malignant neoplasm of right renal pelvis: Secondary | ICD-10-CM | POA: Insufficient documentation

## 2020-11-05 DIAGNOSIS — I10 Essential (primary) hypertension: Secondary | ICD-10-CM | POA: Diagnosis not present

## 2020-11-05 DIAGNOSIS — Z888 Allergy status to other drugs, medicaments and biological substances status: Secondary | ICD-10-CM | POA: Insufficient documentation

## 2020-11-05 DIAGNOSIS — E7800 Pure hypercholesterolemia, unspecified: Secondary | ICD-10-CM | POA: Diagnosis not present

## 2020-11-05 DIAGNOSIS — Z905 Acquired absence of kidney: Secondary | ICD-10-CM | POA: Diagnosis not present

## 2020-11-05 DIAGNOSIS — N3289 Other specified disorders of bladder: Secondary | ICD-10-CM | POA: Diagnosis not present

## 2020-11-05 DIAGNOSIS — Z87891 Personal history of nicotine dependence: Secondary | ICD-10-CM | POA: Diagnosis not present

## 2020-11-05 DIAGNOSIS — N3090 Cystitis, unspecified without hematuria: Secondary | ICD-10-CM | POA: Diagnosis not present

## 2020-11-05 HISTORY — DX: Asymptomatic varicose veins of bilateral lower extremities: I83.93

## 2020-11-05 HISTORY — DX: Disorder of the skin and subcutaneous tissue, unspecified: L98.9

## 2020-11-05 HISTORY — DX: Acute embolism and thrombosis of unspecified vein: I82.90

## 2020-11-05 HISTORY — DX: Presence of dental prosthetic device (complete) (partial): Z97.2

## 2020-11-05 HISTORY — DX: Localized edema: R60.0

## 2020-11-05 HISTORY — DX: Presence of spectacles and contact lenses: Z97.3

## 2020-11-05 HISTORY — DX: Malignant neoplasm of unspecified kidney, except renal pelvis: C64.9

## 2020-11-05 HISTORY — PX: CYSTOSCOPY WITH BIOPSY: SHX5122

## 2020-11-05 LAB — POCT I-STAT, CHEM 8
BUN: 31 mg/dL — ABNORMAL HIGH (ref 8–23)
Calcium, Ion: 1.4 mmol/L (ref 1.15–1.40)
Chloride: 95 mmol/L — ABNORMAL LOW (ref 98–111)
Creatinine, Ser: 1.3 mg/dL — ABNORMAL HIGH (ref 0.44–1.00)
Glucose, Bld: 91 mg/dL (ref 70–99)
HCT: 37 % (ref 36.0–46.0)
Hemoglobin: 12.6 g/dL (ref 12.0–15.0)
Potassium: 4.3 mmol/L (ref 3.5–5.1)
Sodium: 134 mmol/L — ABNORMAL LOW (ref 135–145)
TCO2: 27 mmol/L (ref 22–32)

## 2020-11-05 SURGERY — CYSTOSCOPY, WITH BIOPSY
Anesthesia: General | Site: Bladder

## 2020-11-05 MED ORDER — OXYCODONE HCL 5 MG PO TABS: 5.0000 mg | ORAL_TABLET | Freq: Once | ORAL | Status: DC | PRN

## 2020-11-05 MED ORDER — FENTANYL CITRATE (PF) 100 MCG/2ML IJ SOLN
INTRAMUSCULAR | Status: DC | PRN
  Administered 2020-11-05: 25 ug via INTRAVENOUS

## 2020-11-05 MED ORDER — ONDANSETRON HCL 4 MG/2ML IJ SOLN: 4.0000 mg | Freq: Once | INTRAMUSCULAR | Status: DC | PRN

## 2020-11-05 MED ORDER — ONDANSETRON HCL 4 MG/2ML IJ SOLN
INTRAMUSCULAR | Status: DC | PRN
  Administered 2020-11-05: 4 mg via INTRAVENOUS

## 2020-11-05 MED ORDER — ONDANSETRON HCL 4 MG/2ML IJ SOLN
INTRAMUSCULAR | Status: AC
  Filled 2020-11-05: qty 2

## 2020-11-05 MED ORDER — OXYCODONE HCL 5 MG/5ML PO SOLN: 5.0000 mg | Freq: Once | ORAL | Status: DC | PRN

## 2020-11-05 MED ORDER — GLYCOPYRROLATE PF 0.2 MG/ML IJ SOSY
PREFILLED_SYRINGE | INTRAMUSCULAR | Status: AC
  Filled 2020-11-05: qty 1

## 2020-11-05 MED ORDER — GLYCOPYRROLATE PF 0.2 MG/ML IJ SOSY
PREFILLED_SYRINGE | INTRAMUSCULAR | Status: DC | PRN
  Administered 2020-11-05: .2 mg via INTRAVENOUS

## 2020-11-05 MED ORDER — LIDOCAINE HCL (PF) 2 % IJ SOLN
INTRAMUSCULAR | Status: AC
  Filled 2020-11-05: qty 5

## 2020-11-05 MED ORDER — EPHEDRINE SULFATE-NACL 50-0.9 MG/10ML-% IV SOSY
PREFILLED_SYRINGE | INTRAVENOUS | Status: DC | PRN
  Administered 2020-11-05: 15 mg via INTRAVENOUS

## 2020-11-05 MED ORDER — DEXAMETHASONE SODIUM PHOSPHATE 10 MG/ML IJ SOLN
INTRAMUSCULAR | Status: AC
  Filled 2020-11-05: qty 1

## 2020-11-05 MED ORDER — STERILE WATER FOR IRRIGATION IR SOLN
Status: DC | PRN
  Administered 2020-11-05: 3000 mL

## 2020-11-05 MED ORDER — PROPOFOL 10 MG/ML IV BOLUS
INTRAVENOUS | Status: DC | PRN
  Administered 2020-11-05: 120 mg via INTRAVENOUS

## 2020-11-05 MED ORDER — CIPROFLOXACIN HCL 500 MG PO TABS: 500.0000 mg | ORAL_TABLET | Freq: Two times a day (BID) | ORAL | 0 refills | Status: AC

## 2020-11-05 MED ORDER — CIPROFLOXACIN IN D5W 400 MG/200ML IV SOLN
INTRAVENOUS | Status: AC
  Filled 2020-11-05: qty 200

## 2020-11-05 MED ORDER — FENTANYL CITRATE (PF) 100 MCG/2ML IJ SOLN: 25.0000 ug | INTRAMUSCULAR | Status: DC | PRN

## 2020-11-05 MED ORDER — SODIUM CHLORIDE 0.9 % IV SOLN
INTRAVENOUS | Status: DC
  Administered 2020-11-05: 1000 mL via INTRAVENOUS

## 2020-11-05 MED ORDER — PHENAZOPYRIDINE HCL 200 MG PO TABS: 200.0000 mg | ORAL_TABLET | Freq: Three times a day (TID) | ORAL | 0 refills | Status: AC | PRN

## 2020-11-05 MED ORDER — LIDOCAINE 2% (20 MG/ML) 5 ML SYRINGE
INTRAMUSCULAR | Status: DC | PRN
  Administered 2020-11-05: 60 mg via INTRAVENOUS

## 2020-11-05 MED ORDER — CIPROFLOXACIN IN D5W 400 MG/200ML IV SOLN
400.0000 mg | Freq: Once | INTRAVENOUS | Status: AC
  Administered 2020-11-05: 400 mg via INTRAVENOUS

## 2020-11-05 MED ORDER — EPHEDRINE 5 MG/ML INJ
INTRAVENOUS | Status: AC
  Filled 2020-11-05: qty 10

## 2020-11-05 MED ORDER — FENTANYL CITRATE (PF) 100 MCG/2ML IJ SOLN
INTRAMUSCULAR | Status: AC
  Filled 2020-11-05: qty 2

## 2020-11-05 MED ORDER — DEXAMETHASONE SODIUM PHOSPHATE 10 MG/ML IJ SOLN
INTRAMUSCULAR | Status: DC | PRN
  Administered 2020-11-05: 10 mg via INTRAVENOUS

## 2020-11-05 MED ORDER — PROPOFOL 10 MG/ML IV BOLUS
INTRAVENOUS | Status: AC
  Filled 2020-11-05: qty 20

## 2020-11-05 SURGICAL SUPPLY — 18 items
BAG DRAIN URO-CYSTO SKYTR STRL (DRAIN) ×2 IMPLANT
BAG DRN UROCATH (DRAIN) ×1
CATH ROBINSON RED A/P 14FR (CATHETERS) IMPLANT
CLOTH BEACON ORANGE TIMEOUT ST (SAFETY) ×2 IMPLANT
ELECT REM PT RETURN 9FT ADLT (ELECTROSURGICAL) ×2
ELECTRODE REM PT RTRN 9FT ADLT (ELECTROSURGICAL) ×1 IMPLANT
GLOVE SURG ENC MOIS LTX SZ7.5 (GLOVE) ×2 IMPLANT
GLOVE SURG SS PI 7.5 STRL IVOR (GLOVE) ×2 IMPLANT
GOWN STRL REUS W/TWL LRG LVL3 (GOWN DISPOSABLE) ×4 IMPLANT
KIT TURNOVER CYSTO (KITS) ×2 IMPLANT
MANIFOLD NEPTUNE II (INSTRUMENTS) ×2 IMPLANT
NDL SAFETY ECLIPSE 18X1.5 (NEEDLE) IMPLANT
NEEDLE HYPO 18GX1.5 SHARP (NEEDLE)
PACK CYSTO (CUSTOM PROCEDURE TRAY) ×2 IMPLANT
SYR 20ML LL LF (SYRINGE) IMPLANT
TUBE CONNECTING 12X1/4 (SUCTIONS) ×2 IMPLANT
WATER STERILE IRR 3000ML UROMA (IV SOLUTION) ×2 IMPLANT
WATER STERILE IRR 500ML POUR (IV SOLUTION) ×2 IMPLANT

## 2020-11-05 NOTE — Anesthesia Preprocedure Evaluation (Signed)
Anesthesia Evaluation  Patient identified by MRN, date of birth, ID band Patient awake    Reviewed: Allergy & Precautions, NPO status , Patient's Chart, lab work & pertinent test results  History of Anesthesia Complications Negative for: history of anesthetic complications  Airway Mallampati: II  TM Distance: >3 FB Neck ROM: Full    Dental  (+) Partial Lower, Partial Upper   Pulmonary neg pulmonary ROS, former smoker,    Pulmonary exam normal        Cardiovascular hypertension, Pt. on medications Normal cardiovascular exam     Neuro/Psych negative neurological ROS  negative psych ROS   GI/Hepatic negative GI ROS, Neg liver ROS,   Endo/Other  negative endocrine ROS  Renal/GU Renal InsufficiencyRenal disease (renal cancer s/p nephrectomy/adrenalectomy; Cr 1.30)   Bladder cancer    Musculoskeletal negative musculoskeletal ROS (+)   Abdominal   Peds  Hematology negative hematology ROS (+)   Anesthesia Other Findings   Reproductive/Obstetrics                            Anesthesia Physical Anesthesia Plan  ASA: III  Anesthesia Plan: General   Post-op Pain Management:    Induction: Intravenous  PONV Risk Score and Plan: 3 and Ondansetron, Dexamethasone and Treatment may vary due to age or medical condition  Airway Management Planned: LMA  Additional Equipment: None  Intra-op Plan:   Post-operative Plan: Extubation in OR  Informed Consent: I have reviewed the patients History and Physical, chart, labs and discussed the procedure including the risks, benefits and alternatives for the proposed anesthesia with the patient or authorized representative who has indicated his/her understanding and acceptance.     Dental advisory given  Plan Discussed with:   Anesthesia Plan Comments:         Anesthesia Quick Evaluation

## 2020-11-05 NOTE — Discharge Instructions (Signed)

## 2020-11-05 NOTE — H&P (Signed)
PRE-Op H&P  Office Visit Report     10/16/2020   --------------------------------------------------------------------------------   Kayla Bowers  MRN: [id number]  DOB: 01-27-1929, 87 year old Female   PRIMARY CARE:  Herschel Senegal, MD  REFERRING:  Daine Gravel, NP  PROVIDER:  Ellison Hughs, M.D.  LOCATION:  [facility] [phone]     --------------------------------------------------------------------------------   CC/HPI: Right renal pelvis mass   [name] is an 87 year old female with a history of bladder cancer that was diagnosed and treated by Dr. Comer Locket in 2018. The patient has been referred by Dr. Gloriann Loan after she was found to have multiple lesions involving the right kidney concerning for possible urothelial carcinoma along with a 2 cm right adrenal nodule on recent MRI. The right renal pelvis lesion was ultimately found to be high grade urothelial carcinoma. She is s/p right robotic nephroureterectomy on 05/21/20--The patient is currently receiving adjuvant chemotherapy via carboplatin and gemcitabine with Dr. Alen Blew.   Surgical pathology- pT3 UCC with lymph node involvement (N2--1 out of 3 lymph nodes submitted). 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  Past surgical history is significant for an exploratory laparotomy following an episode of diverticulitis in 2010, open hysterectomy and abdominoplasty.   06/05/20: The patient is here today for a routine post-op follow-up. She is doing well following surgery states that she has minimal pain. She is tolerating a regular diet and having daily bowel movements. She is ambulating without difficulty denies shortness of breath or chest pain. She has also urinating without difficulty and denies interval UTIs, dysuria or hematuria.   07/04/20: 87 year old female who had a right robotic nephrouterectomy on 05/21/20. She is doing very well post procedure. She denies fevers,  chills, nausea and vomiting. She reports that he energy levels have gone back to normal as well as her appetite. She is is wnating to return to her normal activities. She denies dysuria, gross hematuria, frequency, urgency.   10/16/20: The patient is here today for a routine follow-up and cystoscopy. The patient is currently receiving adjuvant chemotherapy via carboplatin and gemcitabine with Dr. Alen Blew. She is urinating without difficulty denies interval UTIs, dysuria or hematuria. CT urogram performed today shows no overt signs of metastatic disease--final read pending     ALLERGIES: Flomax    MEDICATIONS: Hydrochlorothiazide  Estradiol 0.01 % cream with applicator Apply a pea-sized amnt around the urethra & vaginal opening once daily for 2 weeks, then transition to every other day.  Fenofibrate  Multivitamin  Probiotic     GU PSH: Cysto Uretero Biopsy Fulgura, Right - 01/30/2020 Nephroureterectomy (lap), Right - 05/21/2020       PSH Notes: Abdominoplasty  Varicose Veins     NON-GU PSH: Hysterectomy Partial Remove Colon Tonsillectomy     GU PMH: Renal pelvis cancer, right - 07/04/2020, pT3, N2, M0 UCC of the right renal pelvis, - 06/05/2020, - 02/15/2020 History of bladder cancer - 06/05/2020, - 12/06/2019 Chronic cystitis (w/o hematuria) - 02/15/2020 Right renal neoplasm - 01/10/2020 Benign tumor right kidney - 12/06/2019 Renal calculus      PMH Notes: C-diff  Spine fracture   NON-GU PMH: Personal history of malignant neoplasm of other organs and systems - 06/05/2020 Right adrenal neoplasm - 12/06/2019 DVT, History    FAMILY HISTORY: 1 Daughter - Other 1 son - Other   SOCIAL HISTORY: Marital Status: Married Preferred Language: English; Race: White Current Smoking Status: Patient does not smoke anymore. Has not smoked  since 11/26/1994.   Tobacco Use Assessment Completed: Used Tobacco in last 30 days? Drinks 4+ caffeinated drinks per day.    REVIEW OF SYSTEMS:    GU Review  Female:   Patient denies frequent urination, hard to postpone urination, burning /pain with urination, get up at night to urinate, leakage of urine, stream starts and stops, trouble starting your stream, have to strain to urinate, and being pregnant.  Gastrointestinal (Upper):   Patient denies nausea, vomiting, and indigestion/ heartburn.  Gastrointestinal (Lower):   Patient denies constipation and diarrhea.  Constitutional:   Patient denies fever, night sweats, weight loss, and fatigue.  Skin:   Patient denies skin rash/ lesion and itching.  Eyes:   Patient denies blurred vision and double vision.  Ears/ Nose/ Throat:   Patient denies sore throat and sinus problems.  Hematologic/Lymphatic:   Patient denies swollen glands and easy bruising.  Cardiovascular:   Patient denies leg swelling and chest pains.  Respiratory:   Patient denies cough and shortness of breath.  Endocrine:   Patient denies excessive thirst.  Musculoskeletal:   Patient denies back pain and joint pain.  Neurological:   Patient denies headaches and dizziness.  Psychologic:   Patient denies depression and anxiety.   VITAL SIGNS:      10/16/2020 12:52 PM  Weight 154 lb / 69.85 kg  Height 66 in / 167.64 cm  BP 165/69 mmHg  Pulse 81 /min  Temperature 97.3 F / 36.2 C  BMI 24.9 kg/m   GU PHYSICAL EXAMINATION:    Urethral Meatus: Normal size. Normal position. No discharge.  Urethra: No tenderness, no mass, no scarring. No hypermobility. No leakage.  Bladder: Normal to palpation, no tenderness, no mass, normal size.  Vagina: No atrophy, no stenosis. No rectocele. No cystocele. No enterocele.   MULTI-SYSTEM PHYSICAL EXAMINATION:    Constitutional: Well-nourished. No physical deformities. Normally developed. Good grooming.  Neurologic / Psychiatric: Oriented to time, oriented to place, oriented to person. No depression, no anxiety, no agitation.  Musculoskeletal: Normal gait and station of head and neck.     PAST DATA  REVIEW: None   PROCEDURES:         Flexible Cystoscopy - 52000  Risks, benefits, and some of the potential complications of the procedure were discussed at length with the patient including infection, bleeding, voiding discomfort, urinary retention, fever, chills, sepsis, and others. All questions were answered. Informed consent was obtained. Antibiotic prophylaxis was given. Sterile technique and intraurethral analgesia were used.  Meatus:  Normal size. Normal location. Normal condition.  Urethra:  No hypermobility. No leakage.  Ureteral Orifices:  Normal location. Normal size. Normal shape. Effluxed clear urine.  Bladder:  There are 2 patchy and erythematous lesions measuring approximately 2 to 3 cm involving the lateral aspects of the posterior bladder wall, concerning for CIS      The lower urinary tract was carefully examined. The procedure was well-tolerated and without complications. Antibiotic instructions were given. Instructions were given to call the office immediately for bloody urine, difficulty urinating, urinary retention, painful or frequent urination, fever, chills, nausea, vomiting or other illness. The patient stated that she understood these instructions and would comply with them.          C.T. Chest-Abd-Pelvis w & w/o - 71260, 74178, [id number]      omnipaque 300 80cc Patient confirmed No Neulasta OnPro Device.           Urinalysis w/Scope Dipstick Dipstick Cont'd Micro  Color: Yellow Bilirubin: Neg  mg/dL WBC/hpf: NS (Not Seen)  Appearance: Clear Ketones: Neg mg/dL RBC/hpf: 3 - 10/hpf  Specific Gravity: 1.015 Blood: 1+ ery/uL Bacteria: Rare (0-9/hpf)  pH: 7.5 Protein: Neg mg/dL Cystals: NS (Not Seen)  Glucose: Neg mg/dL Urobilinogen: 0.2 mg/dL Casts: NS (Not Seen)    Nitrites: Neg Trichomonas: Not Present    Leukocyte Esterase: Neg leu/uL Mucous: Not Present      Epithelial Cells: 0 - 5/hpf      Yeast: NS (Not Seen)      Sperm: Not Present    ASSESSMENT:       ICD-10 Details  1 GU:   Renal pelvis cancer, right - C65.1 Chronic, Stable  2   Bladder Cancer Posterior - C67.4 Chronic, Worsening   PLAN:           Schedule Return Visit/Planned Activity: Next Available Appointment - Schedule Surgery          Document Letter(s):  Created for Herschel Senegal, MD   Created for Patient: Clinical Summary         Notes:   -Cystoscopy today revealed 2 patchy and erythematous lesions measuring approximately 2 to 3 cm involving the lateral aspects of the posterior bladder wall, concerning for CIS  -The risks, benefits and alternatives of cystoscopy with bladder biopsy and fulguration was discussed with the patient. The risks include, but are not limited to, bleeding, urinary tract infection, bladder perforation requiring prolonged catheterization and/or open bladder repair, ureteral obstruction, voiding dysfunction and the inherent risks of general anesthesia. The patient voices understanding and wishes to proceed.

## 2020-11-05 NOTE — Op Note (Signed)
Operative Note  Preoperative diagnosis:  1.  Erythematous lesions involving the posterior bladder wall 2.  History of upper tract and bladder urothelial carcinoma  Postoperative diagnosis: 1.  Erythematous lesions involving the posterior bladder wall 2.  History of upper tract and bladder urothelial carcinoma  Procedure(s): 1.  Cystoscopy with bladder biopsy and fulguration  Surgeon: Ellison Hughs, MD  Assistants:  None  Anesthesia:  General  Complications:  None  EBL: Less than 5 mL  Specimens: 1.  Posterior bladder wall biopsies  Drains/Catheters: 1.  None  Intraoperative findings:   1. Patchy, erythematous lesions involving the lateral aspects of the posterior bladder wall.  No papillary bladder tumors or any other intravesical abnormalities were identified  Indication:  Kayla Bowers is a 87 y.o. female with with a history of bladder cancer that was diagnosed and treated by Dr. Comer Locket in 2018. The patient has been referred by Dr. Gloriann Loan after she was found to have multiple lesions involving the right kidney concerning for possible urothelial carcinoma along with a 2 cm right adrenal nodule on recent MRI. The right renal pelvis lesion was ultimately found to be high grade urothelial carcinoma. She is s/p right robotic nephroureterectomy on 05/21/20--The patient is currently receiving adjuvant chemotherapy via carboplatin and gemcitabine with Dr. Alen Blew.  During her recent surveillance cystoscopy, the patient was found to have patchy erythematous lesions involving the posterior bladder wall, concerning for CIS.  She has been consented for the above procedures, voices understanding wishes to proceed.  Description of procedure:  After informed consent was obtained, the patient was brought to the operating room and general LMA anesthesia was administered. The patient was then placed in the dorsolithotomy position and prepped and draped in the usual sterile fashion. A  timeout was performed. A 23 French rigid cystoscope was then inserted into the urethral meatus and advanced into the bladder under direct vision. A complete bladder survey revealed patchy erythematous lesions measuring approximately 2 to 3 cm involving the lateral aspects of the posterior bladder wall.  Cold cup biopsies were then taken of the after mentioned lesions and sent for permanent section.  Bugbee electrocautery was then used to extensively fulgurate the areas that were biopsied and the immediately adjacent bladder mucosa.  Hemostasis was achieved.  Bladder was drained.  She was transferred to the postanesthesia in stable condition.  Plan: Follow-up on 11/24/2020 to discuss pathology results.

## 2020-11-05 NOTE — Anesthesia Procedure Notes (Signed)
Procedure Name: LMA Insertion Date/Time: 11/05/2020 12:40 PM Performed by: Rogers Blocker, CRNA Pre-anesthesia Checklist: Patient identified, Emergency Drugs available, Suction available and Patient being monitored Patient Re-evaluated:Patient Re-evaluated prior to induction Oxygen Delivery Method: Circle System Utilized Preoxygenation: Pre-oxygenation with 100% oxygen Induction Type: IV induction Ventilation: Mask ventilation without difficulty LMA: LMA inserted LMA Size: 4.0 Number of attempts: 1 Placement Confirmation: positive ETCO2 Tube secured with: Tape Dental Injury: Teeth and Oropharynx as per pre-operative assessment

## 2020-11-05 NOTE — Transfer of Care (Signed)
Immediate Anesthesia Transfer of Care Note  Patient: Kayla Bowers  Procedure(s) Performed: CYSTOSCOPY WITH BLADDER BIOPSY/ FULGURATION (N/A Bladder)  Patient Location: PACU  Anesthesia Type:General  Level of Consciousness: awake, alert , oriented and patient cooperative  Airway & Oxygen Therapy: Patient Spontanous Breathing  Post-op Assessment: Report given to RN and Post -op Vital signs reviewed and stable  Post vital signs: Reviewed and stable  Last Vitals:  Vitals Value Taken Time  BP 136/58 11/05/20 1310  Temp    Pulse 89 11/05/20 1311  Resp 12 11/05/20 1311  SpO2 98 % 11/05/20 1311  Vitals shown include unvalidated device data.  Last Pain:  Vitals:   11/05/20 1053  TempSrc: Oral  PainSc: 0-No pain      Patients Stated Pain Goal: 6 (21/74/71 5953)  Complications: No complications documented.

## 2020-11-05 NOTE — Anesthesia Postprocedure Evaluation (Signed)
Anesthesia Post Note  Patient: Kayla Bowers  Procedure(s) Performed: CYSTOSCOPY WITH BLADDER BIOPSY/ FULGURATION (N/A Bladder)     Patient location during evaluation: PACU Anesthesia Type: General Level of consciousness: awake and alert Pain management: pain level controlled Vital Signs Assessment: post-procedure vital signs reviewed and stable Respiratory status: spontaneous breathing, nonlabored ventilation and respiratory function stable Cardiovascular status: blood pressure returned to baseline and stable Postop Assessment: no apparent nausea or vomiting Anesthetic complications: no   No complications documented.  Last Vitals:  Vitals:   11/05/20 1310 11/05/20 1315  BP: (!) 136/58 (!) 129/53  Pulse: 91 84  Resp: 14 12  Temp: (!) 36.3 C   SpO2: 99% 96%    Last Pain:  Vitals:   11/05/20 1310  TempSrc:   PainSc: 0-No pain                 Lidia Collum

## 2020-11-06 LAB — SURGICAL PATHOLOGY

## 2020-11-07 ENCOUNTER — Encounter (HOSPITAL_BASED_OUTPATIENT_CLINIC_OR_DEPARTMENT_OTHER): Payer: Self-pay | Admitting: Urology

## 2020-11-24 DIAGNOSIS — Z8551 Personal history of malignant neoplasm of bladder: Secondary | ICD-10-CM | POA: Diagnosis not present

## 2020-11-24 DIAGNOSIS — C651 Malignant neoplasm of right renal pelvis: Secondary | ICD-10-CM | POA: Diagnosis not present

## 2020-11-25 DIAGNOSIS — E782 Mixed hyperlipidemia: Secondary | ICD-10-CM | POA: Diagnosis not present

## 2020-11-25 DIAGNOSIS — N1831 Chronic kidney disease, stage 3a: Secondary | ICD-10-CM | POA: Diagnosis not present

## 2020-11-25 DIAGNOSIS — I129 Hypertensive chronic kidney disease with stage 1 through stage 4 chronic kidney disease, or unspecified chronic kidney disease: Secondary | ICD-10-CM | POA: Diagnosis not present

## 2020-11-25 DIAGNOSIS — I700 Atherosclerosis of aorta: Secondary | ICD-10-CM | POA: Diagnosis not present

## 2020-12-25 ENCOUNTER — Inpatient Hospital Stay: Payer: Medicare PPO | Attending: Oncology

## 2020-12-25 ENCOUNTER — Other Ambulatory Visit: Payer: Self-pay

## 2020-12-25 DIAGNOSIS — Z452 Encounter for adjustment and management of vascular access device: Secondary | ICD-10-CM | POA: Insufficient documentation

## 2020-12-25 DIAGNOSIS — Z8553 Personal history of malignant neoplasm of renal pelvis: Secondary | ICD-10-CM | POA: Diagnosis not present

## 2020-12-25 DIAGNOSIS — Z95828 Presence of other vascular implants and grafts: Secondary | ICD-10-CM

## 2020-12-25 MED ORDER — HEPARIN SOD (PORK) LOCK FLUSH 100 UNIT/ML IV SOLN
500.0000 [IU] | Freq: Once | INTRAVENOUS | Status: AC
  Administered 2020-12-25: 500 [IU] via INTRAVENOUS
  Filled 2020-12-25: qty 5

## 2020-12-25 MED ORDER — SODIUM CHLORIDE 0.9% FLUSH
10.0000 mL | Freq: Once | INTRAVENOUS | Status: AC
  Administered 2020-12-25: 10 mL
  Filled 2020-12-25: qty 10

## 2020-12-26 DIAGNOSIS — I129 Hypertensive chronic kidney disease with stage 1 through stage 4 chronic kidney disease, or unspecified chronic kidney disease: Secondary | ICD-10-CM | POA: Diagnosis not present

## 2020-12-26 DIAGNOSIS — E782 Mixed hyperlipidemia: Secondary | ICD-10-CM | POA: Diagnosis not present

## 2020-12-26 DIAGNOSIS — M810 Age-related osteoporosis without current pathological fracture: Secondary | ICD-10-CM | POA: Diagnosis not present

## 2020-12-29 DIAGNOSIS — E782 Mixed hyperlipidemia: Secondary | ICD-10-CM | POA: Diagnosis not present

## 2021-01-05 DIAGNOSIS — Z1339 Encounter for screening examination for other mental health and behavioral disorders: Secondary | ICD-10-CM | POA: Diagnosis not present

## 2021-01-05 DIAGNOSIS — I129 Hypertensive chronic kidney disease with stage 1 through stage 4 chronic kidney disease, or unspecified chronic kidney disease: Secondary | ICD-10-CM | POA: Diagnosis not present

## 2021-01-05 DIAGNOSIS — Z136 Encounter for screening for cardiovascular disorders: Secondary | ICD-10-CM | POA: Diagnosis not present

## 2021-01-05 DIAGNOSIS — E782 Mixed hyperlipidemia: Secondary | ICD-10-CM | POA: Diagnosis not present

## 2021-01-05 DIAGNOSIS — J439 Emphysema, unspecified: Secondary | ICD-10-CM | POA: Diagnosis not present

## 2021-01-05 DIAGNOSIS — N183 Chronic kidney disease, stage 3 unspecified: Secondary | ICD-10-CM | POA: Diagnosis not present

## 2021-01-05 DIAGNOSIS — Z0000 Encounter for general adult medical examination without abnormal findings: Secondary | ICD-10-CM | POA: Diagnosis not present

## 2021-01-05 DIAGNOSIS — Z139 Encounter for screening, unspecified: Secondary | ICD-10-CM | POA: Diagnosis not present

## 2021-01-05 DIAGNOSIS — Z1331 Encounter for screening for depression: Secondary | ICD-10-CM | POA: Diagnosis not present

## 2021-01-25 DIAGNOSIS — M810 Age-related osteoporosis without current pathological fracture: Secondary | ICD-10-CM | POA: Diagnosis not present

## 2021-01-25 DIAGNOSIS — E782 Mixed hyperlipidemia: Secondary | ICD-10-CM | POA: Diagnosis not present

## 2021-01-25 DIAGNOSIS — I129 Hypertensive chronic kidney disease with stage 1 through stage 4 chronic kidney disease, or unspecified chronic kidney disease: Secondary | ICD-10-CM | POA: Diagnosis not present

## 2021-02-17 DIAGNOSIS — R197 Diarrhea, unspecified: Secondary | ICD-10-CM | POA: Diagnosis not present

## 2021-02-19 ENCOUNTER — Inpatient Hospital Stay: Payer: Medicare PPO | Attending: Oncology

## 2021-02-19 ENCOUNTER — Other Ambulatory Visit: Payer: Self-pay

## 2021-02-19 DIAGNOSIS — C641 Malignant neoplasm of right kidney, except renal pelvis: Secondary | ICD-10-CM | POA: Diagnosis not present

## 2021-02-19 DIAGNOSIS — Z452 Encounter for adjustment and management of vascular access device: Secondary | ICD-10-CM | POA: Diagnosis not present

## 2021-02-19 DIAGNOSIS — Z95828 Presence of other vascular implants and grafts: Secondary | ICD-10-CM

## 2021-02-19 MED ORDER — HEPARIN SOD (PORK) LOCK FLUSH 100 UNIT/ML IV SOLN
500.0000 [IU] | Freq: Once | INTRAVENOUS | Status: AC
  Administered 2021-02-19: 500 [IU] via INTRAVENOUS
  Filled 2021-02-19: qty 5

## 2021-02-19 MED ORDER — SODIUM CHLORIDE 0.9% FLUSH
10.0000 mL | Freq: Once | INTRAVENOUS | Status: AC
  Administered 2021-02-19: 10 mL
  Filled 2021-02-19: qty 10

## 2021-02-25 DIAGNOSIS — E782 Mixed hyperlipidemia: Secondary | ICD-10-CM | POA: Diagnosis not present

## 2021-02-25 DIAGNOSIS — I129 Hypertensive chronic kidney disease with stage 1 through stage 4 chronic kidney disease, or unspecified chronic kidney disease: Secondary | ICD-10-CM | POA: Diagnosis not present

## 2021-02-25 DIAGNOSIS — M810 Age-related osteoporosis without current pathological fracture: Secondary | ICD-10-CM | POA: Diagnosis not present

## 2021-02-26 IMAGING — US IR IMAGING GUIDED PORT INSERTION
1 series · 1 of 1 positions shown · non-contrast
Comparison: Chest CT-11/09/2019

INDICATION: History of renal cell carcinoma. In need of durable intravenous
access for medication administration.

EXAM:
IMPLANTED PORT A CATH PLACEMENT WITH ULTRASOUND AND FLUOROSCOPIC
GUIDANCE

[Series 1: (id) · 1 of 1 slices shown]
[im 1/1]
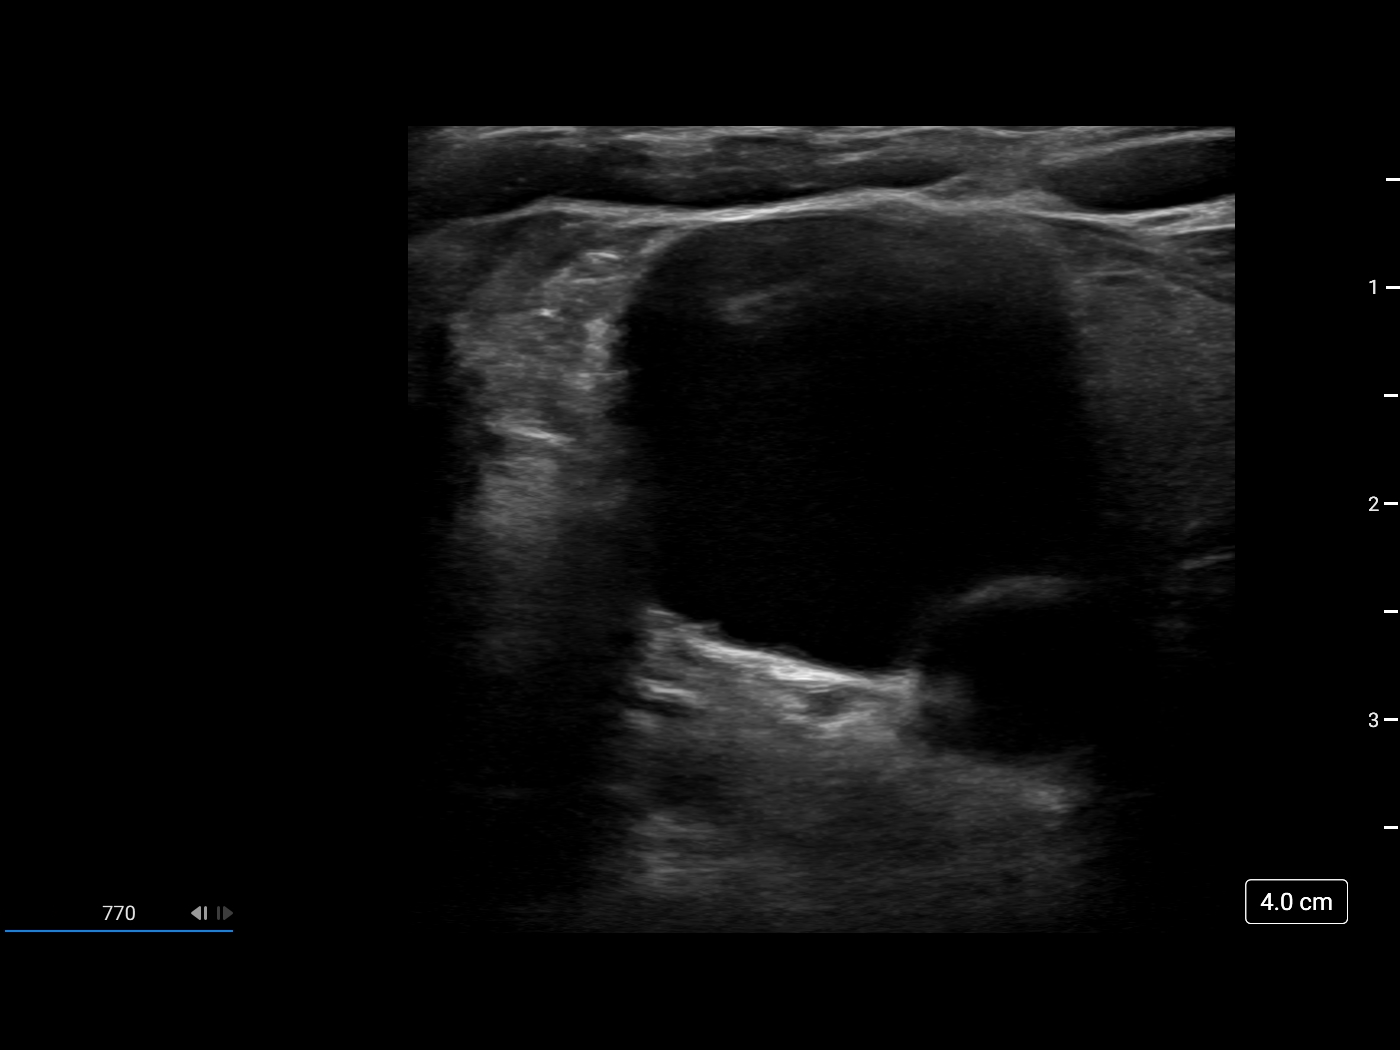

[1 of 1 positions shown; findings below may reference images not displayed]

MEDICATIONS:
Ancef 2 gm IV; The antibiotic was administered within an appropriate
time interval prior to skin puncture.

ANESTHESIA/SEDATION:
Moderate (conscious) sedation was employed during this procedure. A
total of Versed 2 mg and Fentanyl 100 mcg was administered
intravenously.

Moderate Sedation Time: 22 minutes. The patient's level of
consciousness and vital signs were monitored continuously by
radiology nursing throughout the procedure under my direct
supervision.

CONTRAST:  None

FLUOROSCOPY TIME:  24 seconds (3 mGy)

COMPLICATIONS:
None immediate.

PROCEDURE:
The procedure, risks, benefits, and alternatives were explained to
the patient. Questions regarding the procedure were encouraged and
answered. The patient understands and consents to the procedure.

The right neck and chest were prepped with chlorhexidine in a
sterile fashion, and a sterile drape was applied covering the
operative field. Maximum barrier sterile technique with sterile
gowns and gloves were used for the procedure. A timeout was
performed prior to the initiation of the procedure. Local anesthesia
was provided with 1% lidocaine with epinephrine.

After creating a small venotomy incision, a micropuncture kit was
utilized to access the internal jugular vein. Real-time ultrasound
guidance was utilized for vascular access including the acquisition
of a permanent ultrasound image documenting patency of the accessed
vessel. The microwire was utilized to measure appropriate catheter
length.

A subcutaneous port pocket was then created along the upper chest
wall utilizing a combination of sharp and blunt dissection. The
pocket was irrigated with sterile saline. A single lumen "Slim"
sized power injectable port was chosen for placement. The 8 Fr
catheter was tunneled from the port pocket site to the venotomy
incision. The port was placed in the pocket. The external catheter
was trimmed to appropriate length. At the venotomy, an 8 Fr
peel-away sheath was placed over a guidewire under fluoroscopic
guidance. The catheter was then placed through the sheath and the
sheath was removed. Final catheter positioning was confirmed and
documented with a fluoroscopic spot radiograph. The port was
accessed with Thien Apollon needle, aspirated and flushed with heparinized
saline.

The venotomy site was closed with an interrupted 4-0 Vicryl suture.
The port pocket incision was closed with interrupted 2-0 Vicryl
suture. Dermabond and Bloch were applied to both incisions.
Dressings were applied. The patient tolerated the procedure well
without immediate post procedural complication.
FINDINGS: After catheter placement, the tip lies within the superior
cavoatrial junction. The catheter aspirates and flushes normally and
is ready for immediate use.
IMPRESSION: Successful placement of a right internal jugular approach power
injectable Port-A-Cath. The catheter is ready for immediate use.

## 2021-03-26 ENCOUNTER — Other Ambulatory Visit: Payer: Medicare PPO

## 2021-03-26 ENCOUNTER — Inpatient Hospital Stay: Payer: Medicare PPO | Attending: Oncology

## 2021-03-26 ENCOUNTER — Inpatient Hospital Stay: Payer: Medicare PPO | Admitting: Oncology

## 2021-03-26 ENCOUNTER — Other Ambulatory Visit: Payer: Self-pay

## 2021-03-26 VITALS — BP 171/67 | HR 60 | Temp 98.0°F | Resp 17 | Ht 65.0 in | Wt 147.2 lb

## 2021-03-26 DIAGNOSIS — C641 Malignant neoplasm of right kidney, except renal pelvis: Secondary | ICD-10-CM

## 2021-03-26 DIAGNOSIS — Z8553 Personal history of malignant neoplasm of renal pelvis: Secondary | ICD-10-CM | POA: Insufficient documentation

## 2021-03-26 DIAGNOSIS — Z9221 Personal history of antineoplastic chemotherapy: Secondary | ICD-10-CM | POA: Diagnosis not present

## 2021-03-26 DIAGNOSIS — Z95828 Presence of other vascular implants and grafts: Secondary | ICD-10-CM

## 2021-03-26 LAB — CBC WITH DIFFERENTIAL (CANCER CENTER ONLY)
Abs Immature Granulocytes: 0.01 10*3/uL (ref 0.00–0.07)
Basophils Absolute: 0 10*3/uL (ref 0.0–0.1)
Basophils Relative: 1 %
Eosinophils Absolute: 0.2 10*3/uL (ref 0.0–0.5)
Eosinophils Relative: 4 %
HCT: 37.2 % (ref 36.0–46.0)
Hemoglobin: 12.5 g/dL (ref 12.0–15.0)
Immature Granulocytes: 0 %
Lymphocytes Relative: 34 %
Lymphs Abs: 1.4 10*3/uL (ref 0.7–4.0)
MCH: 29.3 pg (ref 26.0–34.0)
MCHC: 33.6 g/dL (ref 30.0–36.0)
MCV: 87.3 fL (ref 80.0–100.0)
Monocytes Absolute: 0.5 10*3/uL (ref 0.1–1.0)
Monocytes Relative: 11 %
Neutro Abs: 2.1 10*3/uL (ref 1.7–7.7)
Neutrophils Relative %: 50 %
Platelet Count: 227 10*3/uL (ref 150–400)
RBC: 4.26 MIL/uL (ref 3.87–5.11)
RDW: 13.4 % (ref 11.5–15.5)
WBC Count: 4.2 10*3/uL (ref 4.0–10.5)
nRBC: 0 % (ref 0.0–0.2)

## 2021-03-26 LAB — CMP (CANCER CENTER ONLY)
ALT: 9 U/L (ref 0–44)
AST: 18 U/L (ref 15–41)
Albumin: 3.8 g/dL (ref 3.5–5.0)
Alkaline Phosphatase: 54 U/L (ref 38–126)
Anion gap: 7 (ref 5–15)
BUN: 21 mg/dL (ref 8–23)
CO2: 28 mmol/L (ref 22–32)
Calcium: 10.2 mg/dL (ref 8.9–10.3)
Chloride: 97 mmol/L — ABNORMAL LOW (ref 98–111)
Creatinine: 1.29 mg/dL — ABNORMAL HIGH (ref 0.44–1.00)
GFR, Estimated: 40 mL/min — ABNORMAL LOW (ref >60)
Glucose, Bld: 94 mg/dL (ref 70–99)
Potassium: 4.3 mmol/L (ref 3.5–5.1)
Sodium: 132 mmol/L — ABNORMAL LOW (ref 135–145)
Total Bilirubin: 0.5 mg/dL (ref 0.3–1.2)
Total Protein: 6.7 g/dL (ref 6.5–8.1)

## 2021-03-26 MED ORDER — SODIUM CHLORIDE 0.9% FLUSH
10.0000 mL | Freq: Once | INTRAVENOUS | Status: AC
  Administered 2021-03-26: 10 mL
  Filled 2021-03-26: qty 10

## 2021-03-26 MED ORDER — HEPARIN SOD (PORK) LOCK FLUSH 100 UNIT/ML IV SOLN
500.0000 [IU] | Freq: Once | INTRAVENOUS | Status: AC
Start: 2021-03-26 — End: 2021-03-26
  Administered 2021-03-26: 500 [IU] via INTRAVENOUS
  Filled 2021-03-26: qty 5

## 2021-03-26 NOTE — Progress Notes (Signed)
Hematology and Oncology Follow Up Visit  [name] [id number] Dec 29, 1928 87 y.o. 03/26/2021 10:34 AM [name], MDHodges, [name], MD   Principle Diagnosis: 87 year old woman with T3N2 high-grade urothelial carcinoma of the renal pelvis diagnosed in 2021.     Prior Therapy:  He is status post robotic assisted right nephro ureterectomy and right adrenalectomy completed by Dr. Lovena Neighbours on May 21, 2020.  Adjuvant chemotherapy utilizing carboplatin and gemcitabine started on July 11, 2020.  She completed 4 cycles of therapy in December 2021.  Current therapy: Active surveillance  Interim History: Ms. Kayla Bowers is here for a follow-up visit.  Since her last visit, she reports no major changes in her health.  She denies any recent hospitalizations or illnesses.  She denies any chest pain, difficulty breathing or hematuria.  Her performance status and quality of life remain reasonable.     Medications: Reviewed without changes. Current Outpatient Medications  Medication Sig Dispense Refill   amLODipine (NORVASC) 5 MG tablet daily.     BioGaia Probiotic (BIOGAIA/GERBER SOOTHE) LIQD Take 5 drops by mouth daily at 8 pm.     fenofibrate 160 MG tablet Take 160 mg by mouth at bedtime.     gabapentin (NEURONTIN) 100 MG capsule 400 mg 3 (three) times daily.     hydrochlorothiazide (MICROZIDE) 12.5 MG capsule Take 12.5 mg by mouth 2 (two) times daily.     lidocaine-prilocaine (EMLA) cream Apply 1 application topically as needed. 30 g 0   Multiple Vitamins-Minerals (PRESERVISION AREDS PO) Take 1 capsule by mouth daily.     Multiple Vitamins-Minerals (PRESERVISION AREDS PO) Take by mouth daily.     phenazopyridine (PYRIDIUM) 200 MG tablet Take 1 tablet (200 mg total) by mouth 3 (three) times daily as needed (for pain with urination). 30 tablet 0   No current facility-administered medications for this visit.     Allergies:  Allergies  Allergen Reactions   Flomax [Tamsulosin Hcl]  Nausea Only     Physical Exam:       ECOG: 1    General appearance: Alert, awake without any distress. Head: Atraumatic without abnormalities Oropharynx: Without any thrush or ulcers. Eyes: No scleral icterus. Lymph nodes: No lymphadenopathy noted in the cervical, supraclavicular, or axillary nodes Heart:regular rate and rhythm, without any murmurs or gallops.   Lung: Clear to auscultation without any rhonchi, wheezes or dullness to percussion. Abdomin: Soft, nontender without any shifting dullness or ascites. Musculoskeletal: No clubbing or cyanosis. Neurological: No motor or sensory deficits. Skin: No rashes or lesions.           Lab Results: Lab Results  Component Value Date   WBC 4.2 03/26/2021   HGB 12.5 03/26/2021   HCT 37.2 03/26/2021   MCV 87.3 03/26/2021   PLT 227 03/26/2021     Chemistry      Component Value Date/Time   NA 134 (L) 11/05/2020 1056   K 4.3 11/05/2020 1056   CL 95 (L) 11/05/2020 1056   CO2 27 10/16/2020 0953   BUN 31 (H) 11/05/2020 1056   CREATININE 1.30 (H) 11/05/2020 1056   CREATININE 1.38 (H) 10/16/2020 0953      Component Value Date/Time   CALCIUM 10.0 10/16/2020 0953   ALKPHOS 56 10/16/2020 0953   AST 23 10/16/2020 0953   ALT 12 10/16/2020 0953   BILITOT 0.4 10/16/2020 0953      Impression and Plan:   87 year old woman with:   1.    T3N2 high-grade urothelial carcinoma on the right  renal pelvis diagnosed in 2021.   Her disease status was updated at this time and treatment choices were discussed.  She is currently on active surveillance and does not have any clinical signs or symptoms of disease progression.  I recommended repeating her staging scans in the near future which she has not completed recently by Dr. Lovena Neighbours.  He is agreeable to have that scheduled in the near future.  Salvage therapy including immunotherapy and antibiotic drug conjugate will be deferred unless she has metastatic disease.     2.  IV  access: Port-A-Cath will continue to be in place and flushed periodically.      3.  Renal function surveillance: Creatinine clearance close to 40 cc/min and will continue to monitor with solitary kidney.      4.  Follow-up: Every 2 months for Port-A-Cath flush and MD follow-up in 6 months.   30  minutes were dedicated to this encounter.  Time was spent on reviewing laboratory data, disease status update and outlining salvage therapy options in the future.   [name], MD 6/30/202210:34 AM

## 2021-03-27 DIAGNOSIS — I129 Hypertensive chronic kidney disease with stage 1 through stage 4 chronic kidney disease, or unspecified chronic kidney disease: Secondary | ICD-10-CM | POA: Diagnosis not present

## 2021-03-27 DIAGNOSIS — M810 Age-related osteoporosis without current pathological fracture: Secondary | ICD-10-CM | POA: Diagnosis not present

## 2021-03-27 DIAGNOSIS — E782 Mixed hyperlipidemia: Secondary | ICD-10-CM | POA: Diagnosis not present

## 2021-04-27 DIAGNOSIS — E782 Mixed hyperlipidemia: Secondary | ICD-10-CM | POA: Diagnosis not present

## 2021-04-27 DIAGNOSIS — M810 Age-related osteoporosis without current pathological fracture: Secondary | ICD-10-CM | POA: Diagnosis not present

## 2021-04-27 DIAGNOSIS — I129 Hypertensive chronic kidney disease with stage 1 through stage 4 chronic kidney disease, or unspecified chronic kidney disease: Secondary | ICD-10-CM | POA: Diagnosis not present

## 2021-05-25 ENCOUNTER — Ambulatory Visit (HOSPITAL_COMMUNITY)
Admission: RE | Admit: 2021-05-25 | Discharge: 2021-05-25 | Disposition: A | Discharge status: Home / Self Care | Payer: Medicare PPO | Source: Ambulatory Visit | Attending: Oncology | Admitting: Oncology

## 2021-05-25 ENCOUNTER — Other Ambulatory Visit: Payer: Self-pay

## 2021-05-25 DIAGNOSIS — C641 Malignant neoplasm of right kidney, except renal pelvis: Secondary | ICD-10-CM | POA: Insufficient documentation

## 2021-05-25 DIAGNOSIS — R911 Solitary pulmonary nodule: Secondary | ICD-10-CM | POA: Diagnosis not present

## 2021-05-25 DIAGNOSIS — J439 Emphysema, unspecified: Secondary | ICD-10-CM | POA: Diagnosis not present

## 2021-05-25 DIAGNOSIS — K5730 Diverticulosis of large intestine without perforation or abscess without bleeding: Secondary | ICD-10-CM | POA: Diagnosis not present

## 2021-05-28 ENCOUNTER — Other Ambulatory Visit: Payer: Self-pay

## 2021-05-28 ENCOUNTER — Inpatient Hospital Stay: Payer: Medicare PPO | Attending: Oncology

## 2021-05-28 DIAGNOSIS — Z95828 Presence of other vascular implants and grafts: Secondary | ICD-10-CM

## 2021-05-28 DIAGNOSIS — C641 Malignant neoplasm of right kidney, except renal pelvis: Secondary | ICD-10-CM | POA: Diagnosis not present

## 2021-05-28 DIAGNOSIS — Z452 Encounter for adjustment and management of vascular access device: Secondary | ICD-10-CM | POA: Insufficient documentation

## 2021-05-28 DIAGNOSIS — Z79899 Other long term (current) drug therapy: Secondary | ICD-10-CM | POA: Diagnosis not present

## 2021-05-28 DIAGNOSIS — Z8553 Personal history of malignant neoplasm of renal pelvis: Secondary | ICD-10-CM | POA: Diagnosis present

## 2021-05-28 DIAGNOSIS — Z5112 Encounter for antineoplastic immunotherapy: Secondary | ICD-10-CM | POA: Insufficient documentation

## 2021-05-28 DIAGNOSIS — E782 Mixed hyperlipidemia: Secondary | ICD-10-CM | POA: Diagnosis not present

## 2021-05-28 DIAGNOSIS — M810 Age-related osteoporosis without current pathological fracture: Secondary | ICD-10-CM | POA: Diagnosis not present

## 2021-05-28 DIAGNOSIS — I129 Hypertensive chronic kidney disease with stage 1 through stage 4 chronic kidney disease, or unspecified chronic kidney disease: Secondary | ICD-10-CM | POA: Diagnosis not present

## 2021-05-28 MED ORDER — HEPARIN SOD (PORK) LOCK FLUSH 100 UNIT/ML IV SOLN
500.0000 [IU] | Freq: Once | INTRAVENOUS | Status: AC
  Administered 2021-05-28: 500 [IU] via INTRAVENOUS

## 2021-05-28 MED ORDER — SODIUM CHLORIDE 0.9% FLUSH
10.0000 mL | Freq: Once | INTRAVENOUS | Status: AC
  Administered 2021-05-28: 10 mL

## 2021-06-02 DIAGNOSIS — E782 Mixed hyperlipidemia: Secondary | ICD-10-CM | POA: Diagnosis not present

## 2021-06-04 ENCOUNTER — Encounter: Payer: Self-pay | Admitting: Oncology

## 2021-06-04 ENCOUNTER — Inpatient Hospital Stay (HOSPITAL_BASED_OUTPATIENT_CLINIC_OR_DEPARTMENT_OTHER): Payer: Medicare PPO | Admitting: Oncology

## 2021-06-04 ENCOUNTER — Other Ambulatory Visit: Payer: Self-pay

## 2021-06-04 VITALS — BP 165/59 | HR 62 | Temp 98.2°F | Resp 18 | Wt 152.4 lb

## 2021-06-04 DIAGNOSIS — C641 Malignant neoplasm of right kidney, except renal pelvis: Secondary | ICD-10-CM | POA: Diagnosis not present

## 2021-06-04 DIAGNOSIS — Z5112 Encounter for antineoplastic immunotherapy: Secondary | ICD-10-CM | POA: Diagnosis not present

## 2021-06-04 DIAGNOSIS — Z79899 Other long term (current) drug therapy: Secondary | ICD-10-CM | POA: Diagnosis not present

## 2021-06-04 DIAGNOSIS — Z452 Encounter for adjustment and management of vascular access device: Secondary | ICD-10-CM | POA: Diagnosis not present

## 2021-06-04 NOTE — Progress Notes (Signed)
Hematology and Oncology Follow Up   Kayla Bowers [id number] [birth year] 87 y.o. 06/04/2021 2:15 PM Kayla Bowers, MDHodges, [name], MD      Principle Diagnosis: 87 year old woman with right renal pelvis tumor diagnosed 2021.  She was found to have T3N2 high-grade urothelial carcinoma at that time.       Prior Therapy:   He is status post robotic assisted right nephro ureterectomy and right adrenalectomy completed by Dr. Lovena Neighbours on May 21, 2020.   Adjuvant chemotherapy utilizing carboplatin and gemcitabine started on July 11, 2020.  She completed 4 cycles of therapy in December 2021.   Current therapy: Active surveillance was under consideration to start additional therapy.   Interim History: [name] returns today for a follow-up visit.  Since her last visit, he reports feeling well without any major complaints.  She denies any residual complications related to chemotherapy eluding worsening neuropathy.  She denies any nausea, vomiting or abdominal pain.  She denies any recent hospitalizations or illnesses.     Medications: I have reviewed the patient's current medications.  Current Outpatient Medications  Medication Sig Dispense Refill   amLODipine (NORVASC) 5 MG tablet daily.     BioGaia Probiotic (BIOGAIA/GERBER SOOTHE) LIQD Take 5 drops by mouth daily at 8 pm.     fenofibrate 160 MG tablet Take 160 mg by mouth at bedtime.     gabapentin (NEURONTIN) 100 MG capsule 400 mg 3 (three) times daily.     hydrochlorothiazide (MICROZIDE) 12.5 MG capsule Take 12.5 mg by mouth 2 (two) times daily.     lidocaine-prilocaine (EMLA) cream Apply 1 application topically as needed. 30 g 0   Multiple Vitamins-Minerals (PRESERVISION AREDS PO) Take 1 capsule by mouth daily.     Multiple Vitamins-Minerals (PRESERVISION AREDS PO) Take by mouth daily.     phenazopyridine (PYRIDIUM) 200 MG tablet Take 1 tablet (200 mg total) by mouth 3 (three) times daily as needed (for pain with urination). 30  tablet 0   No current facility-administered medications for this visit.     Allergies:  Allergies  Allergen Reactions   Flomax [Tamsulosin Hcl] Nausea Only    Physical examination:  Blood pressure (!) 165/59, pulse 62, temperature 98.2 F (36.8 C), temperature source Oral, resp. rate 18, weight 152 lb 6 oz (69.1 kg), SpO2 98 %.  ECOG 1 General appearance: Comfortable appearing without any discomfort Head: Normocephalic without any trauma Oropharynx: Mucous membranes are moist and pink without any thrush or ulcers. Eyes: Pupils are equal and round reactive to light. Lymph nodes: No cervical, supraclavicular, inguinal or axillary lymphadenopathy.   Heart:regular rate and rhythm.  S1 and S2 without leg edema. Lung: Clear without any rhonchi or wheezes.  No dullness to percussion. Abdomin: Soft, nontender, nondistended with good bowel sounds.  No hepatosplenomegaly. Musculoskeletal: No joint deformity or effusion.  Full range of motion noted. Neurological: No deficits noted on motor, sensory and deep tendon reflex exam. Skin: No petechial rash or dryness.  Appeared moist.      Lab Results: Lab Results  Component Value Date   WBC 4.2 03/26/2021   HGB 12.5 03/26/2021   HCT 37.2 03/26/2021   MCV 87.3 03/26/2021   PLT 227 03/26/2021     Chemistry      Component Value Date/Time   NA 132 (L) 03/26/2021 1013   K 4.3 03/26/2021 1013   CL 97 (L) 03/26/2021 1013   CO2 28 03/26/2021 1013   BUN 21 03/26/2021 1013   CREATININE 1.29 (H)  03/26/2021 1013      Component Value Date/Time   CALCIUM 10.2 03/26/2021 1013   ALKPHOS 54 03/26/2021 1013   AST 18 03/26/2021 1013   ALT 9 03/26/2021 1013   BILITOT 0.5 03/26/2021 1013      CLINICAL DATA:  Right renal cancer   EXAM: CT CHEST, ABDOMEN AND PELVIS WITHOUT CONTRAST   TECHNIQUE: Multidetector CT imaging of the chest, abdomen and pelvis was performed following the standard protocol without IV contrast.   COMPARISON:   PET-CT dated 10/17/2020. CT chest abdomen pelvis dated 10/16/2020.   FINDINGS: CT CHEST FINDINGS   Cardiovascular: The heart is top-normal in size. No pericardial effusion.   No evidence of thoracic aortic aneurysm. Atherosclerotic calcifications of the aortic arch.   Three vessel coronary atherosclerosis.   Right chest port terminates at the cavoatrial junction.   Mediastinum/Nodes: No suspicious mediastinal lymphadenopathy.   Visualized thyroid is unremarkable.   Lungs/Pleura: Biapical pleural-parenchymal scarring.   8 mm irregular subsolid nodule in the anterior left upper lobe (series 6/image 28), mildly more conspicuous than on prior studies (by measurement), but favored to be grossly unchanged.   Scarring/atelectasis in the medial left lower lobe, unchanged.   Mild centrilobular and paraseptal emphysematous changes, upper lung predominant.   No focal consolidation.   No pleural effusion or pneumothorax.   Musculoskeletal: Mild degenerative changes of the thoracic spine.   CT ABDOMEN PELVIS FINDINGS   Hepatobiliary: Unenhanced liver is grossly unremarkable, noting vascular calcifications.   Status post cholecystectomy. No intrahepatic or extrahepatic ductal dilatation.   Pancreas: Within normal limits.   Spleen: Coarse calcifications along the anterior spleen (series 2/image 63).   Adrenals/Urinary Tract: 2.0 cm left adrenal adenoma (series 2/image 59), benign. Right adrenal gland is surgically absent.   Status post right nephrectomy. No abnormal soft tissue in the surgical bed.   Left kidney is within normal limits.  No hydronephrosis.   Thick-walled bladder, although underdistended.   Stomach/Bowel: Stomach is within normal limits.   No evidence of bowel obstruction.  Moderate duodenal diverticulum.   Appendix is not discretely visualized.   Scattered colonic diverticulosis, without evidence of diverticulitis.   Vascular/Lymphatic: No  evidence of abdominal aortic aneurysm.   Atherosclerotic calcifications of the abdominal aorta and branch vessels.   Left para-aortic lymphadenopathy measuring up to 2.2 mm short axis (series 2/image 83), new, suspicious for nodal recurrence.   Reproductive: Status post hysterectomy.   No adnexal masses.   Other: No abdominopelvic ascites.   Musculoskeletal: Degenerative changes of the lumbar spine.   IMPRESSION: Status post radical right nephrectomy.   New left para-aortic lymphadenopathy, suspicious for nodal recurrence.   8 mm irregular nodule in the anterior left upper lobe is mildly increased in conspicuity, warranting attention on follow-up, but is favored to be grossly unchanged.  Impression and Plan:  87 year old woman with:   1.    Right renal pelvis tumor diagnosed in 2021.  She was found to have T3N2 high-grade urothelial carcinoma.     CT scan obtained on May 25, 2021 was personally reviewed and discussed with the patient today.  She has limited evidence of relapsed disease but definitely concerning for stage IV involvement with a 2.2 cm left para-aortic lymphadenopathy.  Treatment options at this time discussed including continued active surveillance versus restarting systemic therapy with PD-L1 inhibitors versus local therapy to the suspicious lymph node.  Risks and benefits of immunotherapy were discussed today which include autoimmune complications, GI toxicity and infusion related problems.  After discussion today, she is agreeable to proceed with nivolumab 480 mg every 4 weeks.     2.  IV access: Port-A-Cath remains in place and will be in use for the future.       3.  Renal function surveillance: Kidney function remained stable without any decline.  4.  Antiemetics: Prescription for Compazine will be available to her.  5.  Autoimmune complications: We will continue to educate her about potential autoimmune issues including pneumonitis, colitis and  thyroid disease.       6.  Follow-up: will be in the near future to start immunotherapy.   30  minutes were dedicated to this visit. The time was spent on reviewing laboratory data, imaging studies, discussing treatment options,  and answering questions regarding future plan.       [name], MD 06/04/2021 2:15 PM

## 2021-06-04 NOTE — Progress Notes (Signed)
DISCONTINUE OFF PATHWAY REGIMEN - Bladder   [id number]:Carboplatin + Gemcitabine (5/1,000) q21 Days:   A cycle is every 21 days:     Carboplatin      Gemcitabine   **Always confirm dose/schedule in your pharmacy ordering system**  REASON: Disease Progression PRIOR TREATMENT: Off Pathway: Carboplatin + Gemcitabine (5/1,000) q21 Days TREATMENT RESPONSE: Complete Response (CR)  START ON PATHWAY REGIMEN - Bladder     A cycle is every 28 days:     Nivolumab   **Always confirm dose/schedule in your pharmacy ordering system**  Patient Characteristics: Advanced/Metastatic Disease, First Line, Prior Platinum-Based Therapy, Relapse Within 12 Months, FGFR2/FGFR3 Mutation Negative Unknown, Candidate for PD-1/PD-L1 Inhibitor Therapeutic Status: Advanced/Metastatic Disease Line of Therapy: First Line Prior Platinum-Based Therapy<= Yes Time to Relapse: Relapse Within 12 Months FGFR2/FGFR3 Mutation Status: Did Not Order Test PD-1/PD-L1 Inhibitor Candidate Status: Candidate for PD-1/PD-L1 Inhibitor Intent of Therapy: Non-Curative / Palliative Intent, Discussed with Patient

## 2021-06-05 ENCOUNTER — Telehealth: Payer: Self-pay | Admitting: Oncology

## 2021-06-05 NOTE — Telephone Encounter (Signed)
Scheduled per 09/08 los, patient has been called and notified. 

## 2021-06-09 NOTE — Progress Notes (Signed)
Pharmacist Chemotherapy Monitoring - Initial Assessment    Anticipated start date: 06/16/21   The following has been reviewed per standard work regarding the patient's treatment regimen: The patient's diagnosis, treatment plan and drug doses, and organ/hematologic function Lab orders and baseline tests specific to treatment regimen  The treatment plan start date, drug sequencing, and pre-medications Prior authorization status  Patient's documented medication list, including drug-drug interaction screen and prescriptions for anti-emetics and supportive care specific to the treatment regimen The drug concentrations, fluid compatibility, administration routes, and timing of the medications to be used The patient's access for treatment and lifetime cumulative dose history, if applicable  The patient's medication allergies and previous infusion related reactions, if applicable   Changes made to treatment plan:  N/A  Follow up needed:  Pending authorization for treatment    Philomena Course, [place], 06/09/2021  1:42 PM

## 2021-06-11 DIAGNOSIS — G622 Polyneuropathy due to other toxic agents: Secondary | ICD-10-CM | POA: Diagnosis not present

## 2021-06-11 DIAGNOSIS — I129 Hypertensive chronic kidney disease with stage 1 through stage 4 chronic kidney disease, or unspecified chronic kidney disease: Secondary | ICD-10-CM | POA: Diagnosis not present

## 2021-06-11 DIAGNOSIS — C679 Malignant neoplasm of bladder, unspecified: Secondary | ICD-10-CM | POA: Diagnosis not present

## 2021-06-11 DIAGNOSIS — N183 Chronic kidney disease, stage 3 unspecified: Secondary | ICD-10-CM | POA: Diagnosis not present

## 2021-06-11 DIAGNOSIS — Z23 Encounter for immunization: Secondary | ICD-10-CM | POA: Diagnosis not present

## 2021-06-15 ENCOUNTER — Telehealth: Payer: Self-pay | Admitting: *Deleted

## 2021-06-15 ENCOUNTER — Other Ambulatory Visit: Payer: Self-pay | Admitting: *Deleted

## 2021-06-15 MED ORDER — PROCHLORPERAZINE MALEATE 10 MG PO TABS
10.0000 mg | ORAL_TABLET | Freq: Four times a day (QID) | ORAL | 0 refills | Status: DC | PRN
Start: 2021-06-15 — End: 2022-11-08

## 2021-06-15 NOTE — Telephone Encounter (Signed)
Patient called to request new prescription for antinausea.  She starts Opdivo tomorrow and when last seen by Dr Alen Blew she told him she had some of that medication at home but confirmed today that she does not and will need a new Rx sent in.  Please route Rx to [facility] in [place].  Routed to Dr Alen Blew.

## 2021-06-16 ENCOUNTER — Inpatient Hospital Stay: Payer: Medicare PPO

## 2021-06-16 ENCOUNTER — Other Ambulatory Visit: Payer: Self-pay

## 2021-06-16 VITALS — BP 169/75 | HR 73 | Temp 98.1°F | Resp 18 | Ht 65.0 in | Wt 150.5 lb

## 2021-06-16 DIAGNOSIS — Z95828 Presence of other vascular implants and grafts: Secondary | ICD-10-CM

## 2021-06-16 DIAGNOSIS — C641 Malignant neoplasm of right kidney, except renal pelvis: Secondary | ICD-10-CM

## 2021-06-16 DIAGNOSIS — C649 Malignant neoplasm of unspecified kidney, except renal pelvis: Secondary | ICD-10-CM

## 2021-06-16 DIAGNOSIS — Z452 Encounter for adjustment and management of vascular access device: Secondary | ICD-10-CM | POA: Diagnosis not present

## 2021-06-16 DIAGNOSIS — Z79899 Other long term (current) drug therapy: Secondary | ICD-10-CM | POA: Diagnosis not present

## 2021-06-16 DIAGNOSIS — Z5112 Encounter for antineoplastic immunotherapy: Secondary | ICD-10-CM | POA: Diagnosis not present

## 2021-06-16 LAB — CMP (CANCER CENTER ONLY)
ALT: 10 U/L (ref 0–44)
AST: 18 U/L (ref 15–41)
Albumin: 4.1 g/dL (ref 3.5–5.0)
Alkaline Phosphatase: 50 U/L (ref 38–126)
Anion gap: 9 (ref 5–15)
BUN: 27 mg/dL — ABNORMAL HIGH (ref 8–23)
CO2: 27 mmol/L (ref 22–32)
Calcium: 10.5 mg/dL — ABNORMAL HIGH (ref 8.9–10.3)
Chloride: 98 mmol/L (ref 98–111)
Creatinine: 1.33 mg/dL — ABNORMAL HIGH (ref 0.44–1.00)
GFR, Estimated: 38 mL/min — ABNORMAL LOW (ref >60)
Glucose, Bld: 105 mg/dL — ABNORMAL HIGH (ref 70–99)
Potassium: 4.4 mmol/L (ref 3.5–5.1)
Sodium: 134 mmol/L — ABNORMAL LOW (ref 135–145)
Total Bilirubin: 0.5 mg/dL (ref 0.3–1.2)
Total Protein: 7 g/dL (ref 6.5–8.1)

## 2021-06-16 LAB — CBC WITH DIFFERENTIAL (CANCER CENTER ONLY)
Abs Immature Granulocytes: 0.02 10*3/uL (ref 0.00–0.07)
Basophils Absolute: 0 10*3/uL (ref 0.0–0.1)
Basophils Relative: 1 %
Eosinophils Absolute: 0.2 10*3/uL (ref 0.0–0.5)
Eosinophils Relative: 3 %
HCT: 39.5 % (ref 36.0–46.0)
Hemoglobin: 12.8 g/dL (ref 12.0–15.0)
Immature Granulocytes: 0 %
Lymphocytes Relative: 34 %
Lymphs Abs: 1.7 10*3/uL (ref 0.7–4.0)
MCH: 29 pg (ref 26.0–34.0)
MCHC: 32.4 g/dL (ref 30.0–36.0)
MCV: 89.4 fL (ref 80.0–100.0)
Monocytes Absolute: 0.5 10*3/uL (ref 0.1–1.0)
Monocytes Relative: 10 %
Neutro Abs: 2.6 10*3/uL (ref 1.7–7.7)
Neutrophils Relative %: 52 %
Platelet Count: 247 10*3/uL (ref 150–400)
RBC: 4.42 MIL/uL (ref 3.87–5.11)
RDW: 13.2 % (ref 11.5–15.5)
WBC Count: 5 10*3/uL (ref 4.0–10.5)
nRBC: 0 % (ref 0.0–0.2)

## 2021-06-16 LAB — TSH: TSH: 2.92 u[IU]/mL (ref 0.308–3.960)

## 2021-06-16 MED ORDER — SODIUM CHLORIDE 0.9 % IV SOLN: Freq: Once | INTRAVENOUS | Status: AC

## 2021-06-16 MED ORDER — HEPARIN SOD (PORK) LOCK FLUSH 100 UNIT/ML IV SOLN
500.0000 [IU] | Freq: Once | INTRAVENOUS | Status: AC | PRN
  Administered 2021-06-16: 500 [IU]

## 2021-06-16 MED ORDER — SODIUM CHLORIDE 0.9% FLUSH
10.0000 mL | INTRAVENOUS | Status: DC | PRN
  Administered 2021-06-16: 10 mL

## 2021-06-16 MED ORDER — SODIUM CHLORIDE 0.9% FLUSH
10.0000 mL | Freq: Once | INTRAVENOUS | Status: AC
  Administered 2021-06-16: 10 mL

## 2021-06-16 MED ORDER — SODIUM CHLORIDE 0.9 % IV SOLN
480.0000 mg | Freq: Once | INTRAVENOUS | Status: AC
  Administered 2021-06-16: 480 mg via INTRAVENOUS
  Filled 2021-06-16: qty 48

## 2021-06-16 NOTE — Patient Instructions (Signed)
[facility]   Discharge Instructions: Thank you for choosing [facility] to provide your oncology and hematology care.   If you have a lab appointment with the [facility], please go directly to the [facility] and check in at the registration area.   Wear comfortable clothing and clothing appropriate for easy access to any Portacath or PICC line.   We strive to give you quality time with your provider. You may need to reschedule your appointment if you arrive late (15 or more minutes).  Arriving late affects you and other patients whose appointments are after yours.  Also, if you miss three or more appointments without notifying the office, you may be dismissed from the clinic at the provider's discretion.      For prescription refill requests, have your pharmacy contact our office and allow 72 hours for refills to be completed.    Today you received the following chemotherapy and/or immunotherapy agents: Nivolumab (Opdivo).      To help prevent nausea and vomiting after your treatment, we encourage you to take your nausea medication as directed.  BELOW ARE SYMPTOMS THAT SHOULD BE REPORTED IMMEDIATELY: *FEVER GREATER THAN 100.4 F (38 C) OR HIGHER *CHILLS OR SWEATING *NAUSEA AND VOMITING THAT IS NOT CONTROLLED WITH YOUR NAUSEA MEDICATION *UNUSUAL SHORTNESS OF BREATH *UNUSUAL BRUISING OR BLEEDING *URINARY PROBLEMS (pain or burning when urinating, or frequent urination) *BOWEL PROBLEMS (unusual diarrhea, constipation, pain near the anus) TENDERNESS IN MOUTH AND THROAT WITH OR WITHOUT PRESENCE OF ULCERS (sore throat, sores in mouth, or a toothache) UNUSUAL RASH, SWELLING OR PAIN  UNUSUAL VAGINAL DISCHARGE OR ITCHING   Items with * indicate a potential emergency and should be followed up as soon as possible or go to the Emergency Department if any problems should occur.  Please show the CHEMOTHERAPY ALERT CARD or IMMUNOTHERAPY ALERT CARD at  check-in to the Emergency Department and triage nurse.  Should you have questions after your visit or need to cancel or reschedule your appointment, please contact [facility]  Dept: [phone]  and follow the prompts.  Office hours are 8:00 a.m. to 4:30 p.m. Monday - Friday. Please note that voicemails left after 4:00 p.m. may not be returned until the following business day.  We are closed weekends and major holidays. You have access to a nurse at all times for urgent questions. Please call the main number to the clinic Dept: [phone] and follow the prompts.   For any non-urgent questions, you may also contact your provider using MyChart. We now offer e-Visits for anyone 20 and older to request care online for non-urgent symptoms. For details visit [email or web address].   Also download the MyChart app! Go to the app store, search "MyChart", open the app, select [facility], and log in with your MyChart username and password.  Due to Covid, a mask is required upon entering the hospital/clinic. If you do not have a mask, one will be given to you upon arrival. For doctor visits, patients may have 1 support person aged 50 or older with them. For treatment visits, patients cannot have anyone with them due to current Covid guidelines and our immunocompromised population.    Nivolumab injection What is this medication? NIVOLUMAB (nye VOL ue mab) is a monoclonal antibody. It treats certain types of cancer. Some of the cancers treated are colon cancer, head and neck cancer, Hodgkin lymphoma, lung cancer, and melanoma. This medicine may be used for other  purposes; ask your health care provider or pharmacist if you have questions. COMMON BRAND NAME(S): Opdivo What should I tell my care team before I take this medication? They need to know if you have any of these conditions: autoimmune diseases like Crohn's disease, ulcerative colitis, or lupus have had or planning  to have an allogeneic stem cell transplant (uses someone else's stem cells) history of chest radiation history of organ transplant nervous system problems like myasthenia gravis or Guillain-Barre syndrome an unusual or allergic reaction to nivolumab, other medicines, foods, dyes, or preservatives pregnant or trying to get pregnant breast-feeding How should I use this medication? This medicine is for infusion into a vein. It is given by a health care professional in a hospital or clinic setting. A special MedGuide will be given to you before each treatment. Be sure to read this information carefully each time. Talk to your pediatrician regarding the use of this medicine in children. While this drug may be prescribed for children as young as 12 years for selected conditions, precautions do apply. Overdosage: If you think you have taken too much of this medicine contact a poison control center or emergency room at once. NOTE: This medicine is only for you. Do not share this medicine with others. What if I miss a dose? It is important not to miss your dose. Call your doctor or health care professional if you are unable to keep an appointment. What may interact with this medication? Interactions have not been studied. This list may not describe all possible interactions. Give your health care provider a list of all the medicines, herbs, non-prescription drugs, or dietary supplements you use. Also tell them if you smoke, drink alcohol, or use illegal drugs. Some items may interact with your medicine. What should I watch for while using this medication? This drug may make you feel generally unwell. Continue your course of treatment even though you feel ill unless your doctor tells you to stop. You may need blood work done while you are taking this medicine. Do not become pregnant while taking this medicine or for 5 months after stopping it. Women should inform their doctor if they wish to become  pregnant or think they might be pregnant. There is a potential for serious side effects to an unborn child. Talk to your health care professional or pharmacist for more information. Do not breast-feed an infant while taking this medicine or for 5 months after stopping it. What side effects may I notice from receiving this medication? Side effects that you should report to your doctor or health care professional as soon as possible: allergic reactions like skin rash, itching or hives, swelling of the face, lips, or tongue breathing problems blood in the urine bloody or watery diarrhea or black, tarry stools changes in emotions or moods changes in vision chest pain cough dizziness feeling faint or lightheaded, falls fever, chills headache with fever, neck stiffness, confusion, loss of memory, sensitivity to light, hallucination, loss of contact with reality, or seizures joint pain mouth sores redness, blistering, peeling or loosening of the skin, including inside the mouth severe muscle pain or weakness signs and symptoms of high blood sugar such as dizziness; dry mouth; dry skin; fruity breath; nausea; stomach pain; increased hunger or thirst; increased urination signs and symptoms of kidney injury like trouble passing urine or change in the amount of urine signs and symptoms of liver injury like dark yellow or brown urine; general ill feeling or flu-like symptoms; light-colored stools; loss  of appetite; nausea; right upper belly pain; unusually weak or tired; yellowing of the eyes or skin swelling of the ankles, feet, hands trouble passing urine or change in the amount of urine unusually weak or tired weight gain or loss Side effects that usually do not require medical attention (report to your doctor or health care professional if they continue or are bothersome): bone pain constipation decreased appetite diarrhea muscle pain nausea, vomiting tiredness This list may not describe  all possible side effects. Call your doctor for medical advice about side effects. You may report side effects to FDA at 1-800-FDA-1088. Where should I keep my medication? This drug is given in a hospital or clinic and will not be stored at home. NOTE: This sheet is a summary. It may not cover all possible information. If you have questions about this medicine, talk to your doctor, pharmacist, or health care provider.  2022 Elsevier/Gold Standard (2020-01-16 10:08:25)

## 2021-06-27 DIAGNOSIS — I129 Hypertensive chronic kidney disease with stage 1 through stage 4 chronic kidney disease, or unspecified chronic kidney disease: Secondary | ICD-10-CM | POA: Diagnosis not present

## 2021-06-27 DIAGNOSIS — M810 Age-related osteoporosis without current pathological fracture: Secondary | ICD-10-CM | POA: Diagnosis not present

## 2021-06-27 DIAGNOSIS — E782 Mixed hyperlipidemia: Secondary | ICD-10-CM | POA: Diagnosis not present

## 2021-07-13 DIAGNOSIS — N183 Chronic kidney disease, stage 3 unspecified: Secondary | ICD-10-CM | POA: Diagnosis not present

## 2021-07-13 DIAGNOSIS — E782 Mixed hyperlipidemia: Secondary | ICD-10-CM | POA: Diagnosis not present

## 2021-07-13 DIAGNOSIS — K580 Irritable bowel syndrome with diarrhea: Secondary | ICD-10-CM | POA: Diagnosis not present

## 2021-07-13 DIAGNOSIS — Z23 Encounter for immunization: Secondary | ICD-10-CM | POA: Diagnosis not present

## 2021-07-13 DIAGNOSIS — I129 Hypertensive chronic kidney disease with stage 1 through stage 4 chronic kidney disease, or unspecified chronic kidney disease: Secondary | ICD-10-CM | POA: Diagnosis not present

## 2021-07-15 ENCOUNTER — Inpatient Hospital Stay (HOSPITAL_BASED_OUTPATIENT_CLINIC_OR_DEPARTMENT_OTHER): Payer: Medicare PPO

## 2021-07-15 ENCOUNTER — Other Ambulatory Visit: Payer: Self-pay

## 2021-07-15 ENCOUNTER — Inpatient Hospital Stay: Payer: Medicare PPO | Attending: Oncology | Admitting: Oncology

## 2021-07-15 ENCOUNTER — Inpatient Hospital Stay: Payer: Medicare PPO

## 2021-07-15 VITALS — BP 171/84 | HR 72 | Temp 97.0°F | Resp 18 | Wt 152.4 lb

## 2021-07-15 VITALS — BP 160/78 | HR 72 | Temp 98.2°F | Resp 18

## 2021-07-15 DIAGNOSIS — C641 Malignant neoplasm of right kidney, except renal pelvis: Secondary | ICD-10-CM | POA: Diagnosis not present

## 2021-07-15 DIAGNOSIS — Z79899 Other long term (current) drug therapy: Secondary | ICD-10-CM | POA: Insufficient documentation

## 2021-07-15 DIAGNOSIS — Z95828 Presence of other vascular implants and grafts: Secondary | ICD-10-CM

## 2021-07-15 DIAGNOSIS — C649 Malignant neoplasm of unspecified kidney, except renal pelvis: Secondary | ICD-10-CM

## 2021-07-15 DIAGNOSIS — C651 Malignant neoplasm of right renal pelvis: Secondary | ICD-10-CM | POA: Insufficient documentation

## 2021-07-15 DIAGNOSIS — Z5112 Encounter for antineoplastic immunotherapy: Secondary | ICD-10-CM | POA: Insufficient documentation

## 2021-07-15 LAB — CMP (CANCER CENTER ONLY)
ALT: 14 U/L (ref 0–44)
AST: 20 U/L (ref 15–41)
Albumin: 4.1 g/dL (ref 3.5–5.0)
Alkaline Phosphatase: 59 U/L (ref 38–126)
Anion gap: 11 (ref 5–15)
BUN: 24 mg/dL — ABNORMAL HIGH (ref 8–23)
CO2: 26 mmol/L (ref 22–32)
Calcium: 10.5 mg/dL — ABNORMAL HIGH (ref 8.9–10.3)
Chloride: 99 mmol/L (ref 98–111)
Creatinine: 1.09 mg/dL — ABNORMAL HIGH (ref 0.44–1.00)
GFR, Estimated: 48 mL/min — ABNORMAL LOW (ref >60)
Glucose, Bld: 88 mg/dL (ref 70–99)
Potassium: 4.3 mmol/L (ref 3.5–5.1)
Sodium: 136 mmol/L (ref 135–145)
Total Bilirubin: 0.4 mg/dL (ref 0.3–1.2)
Total Protein: 7.1 g/dL (ref 6.5–8.1)

## 2021-07-15 LAB — CBC WITH DIFFERENTIAL (CANCER CENTER ONLY)
Abs Immature Granulocytes: 0.01 10*3/uL (ref 0.00–0.07)
Basophils Absolute: 0.1 10*3/uL (ref 0.0–0.1)
Basophils Relative: 1 %
Eosinophils Absolute: 0.3 10*3/uL (ref 0.0–0.5)
Eosinophils Relative: 5 %
HCT: 39.1 % (ref 36.0–46.0)
Hemoglobin: 12.7 g/dL (ref 12.0–15.0)
Immature Granulocytes: 0 %
Lymphocytes Relative: 29 %
Lymphs Abs: 1.6 10*3/uL (ref 0.7–4.0)
MCH: 28.5 pg (ref 26.0–34.0)
MCHC: 32.5 g/dL (ref 30.0–36.0)
MCV: 87.7 fL (ref 80.0–100.0)
Monocytes Absolute: 0.6 10*3/uL (ref 0.1–1.0)
Monocytes Relative: 11 %
Neutro Abs: 3 10*3/uL (ref 1.7–7.7)
Neutrophils Relative %: 54 %
Platelet Count: 221 10*3/uL (ref 150–400)
RBC: 4.46 MIL/uL (ref 3.87–5.11)
RDW: 13.2 % (ref 11.5–15.5)
WBC Count: 5.6 10*3/uL (ref 4.0–10.5)
nRBC: 0 % (ref 0.0–0.2)

## 2021-07-15 LAB — TSH: TSH: 1.801 u[IU]/mL (ref 0.308–3.960)

## 2021-07-15 MED ORDER — SODIUM CHLORIDE 0.9% FLUSH
10.0000 mL | Freq: Once | INTRAVENOUS | Status: AC
Start: 2021-07-15 — End: 2021-07-15
  Administered 2021-07-15: 10 mL

## 2021-07-15 MED ORDER — SODIUM CHLORIDE 0.9 % IV SOLN: Freq: Once | INTRAVENOUS | Status: AC

## 2021-07-15 MED ORDER — SODIUM CHLORIDE 0.9 % IV SOLN
480.0000 mg | Freq: Once | INTRAVENOUS | Status: AC
  Administered 2021-07-15: 480 mg via INTRAVENOUS
  Filled 2021-07-15: qty 48

## 2021-07-15 NOTE — Progress Notes (Signed)
Hematology and Oncology Follow Up   Kayla Bowers [id number] 03-05-1929 87 y.o. 07/15/2021 1:59 PM Kayla Bowers, MDHodges, [name], MD      Principle Diagnosis: 87 year old woman with stage IV high-grade urothelial carcinoma of the right renal pelvis diagnosed 2021.  She presented with T3N2 disease.     Prior Therapy:   He is status post robotic assisted right nephro ureterectomy and right adrenalectomy completed by Dr. Lovena Neighbours on May 21, 2020.   Adjuvant chemotherapy utilizing carboplatin and gemcitabine started on July 11, 2020.  She completed 4 cycles of therapy in December 2021.   Current therapy: Nivolumab 480 mg every 4 weeks started on June 24, 2021.  She is here for the second cycle of therapy.  Interim History: [name] presents today for a follow-up visit.  Since last visit, she reports feeling well without any major complaints.  She tolerated the first cycle of nivolumab without any recent complaints.  She denies any nausea, vomiting or abdominal pain.  She did report some mild fatigue that resolved after 24 hours of infusion.  She denies any shortness of breath or pruritus.  Formal status quality of life remained reasonable.     Medications: Updated on review. Current Outpatient Medications  Medication Sig Dispense Refill   amLODipine (NORVASC) 5 MG tablet daily.     BioGaia Probiotic (BIOGAIA/GERBER SOOTHE) LIQD Take 5 drops by mouth daily at 8 pm.     fenofibrate 160 MG tablet Take 160 mg by mouth at bedtime.     gabapentin (NEURONTIN) 100 MG capsule 400 mg 3 (three) times daily.     hydrochlorothiazide (MICROZIDE) 12.5 MG capsule Take 12.5 mg by mouth 2 (two) times daily.     lidocaine-prilocaine (EMLA) cream Apply 1 application topically as needed. 30 g 0   Multiple Vitamins-Minerals (PRESERVISION AREDS PO) Take 1 capsule by mouth daily.     Multiple Vitamins-Minerals (PRESERVISION AREDS PO) Take by mouth daily.     phenazopyridine (PYRIDIUM) 200 MG  tablet Take 1 tablet (200 mg total) by mouth 3 (three) times daily as needed (for pain with urination). 30 tablet 0   prochlorperazine (COMPAZINE) 10 MG tablet Take 1 tablet (10 mg total) by mouth every 6 (six) hours as needed for nausea or vomiting. 30 tablet 0   No current facility-administered medications for this visit.   Facility-Administered Medications Ordered in Other Visits  Medication Dose Route Frequency Provider Last Rate Last Admin   0.9 %  sodium chloride infusion   Intravenous Once Stacey Maura, Mathis Dad, MD       nivolumab (OPDIVO) 480 mg in sodium chloride 0.9 % 100 mL chemo infusion  480 mg Intravenous Once Wyatt Portela, MD         Allergies:  Allergies  Allergen Reactions   Flomax [Tamsulosin Hcl] Nausea Only    Physical examination:  Blood pressure (!) 171/84, pulse 72, temperature (!) 97 F (36.1 C), temperature source Oral, resp. rate 18, weight 152 lb 6 oz (69.1 kg), SpO2 98 %.  ECOG 1    General appearance: Alert, awake without any distress. Head: Atraumatic without abnormalities Oropharynx: Without any thrush or ulcers. Eyes: No scleral icterus. Lymph nodes: No lymphadenopathy noted in the cervical, supraclavicular, or axillary nodes Heart:regular rate and rhythm, without any murmurs or gallops.   Lung: Clear to auscultation without any rhonchi, wheezes or dullness to percussion. Abdomin: Soft, nontender without any shifting dullness or ascites. Musculoskeletal: No clubbing or cyanosis. Neurological: No motor or sensory deficits.  Skin: No rashes or lesions.       Lab Results: Lab Results  Component Value Date   WBC 5.6 07/15/2021   HGB 12.7 07/15/2021   HCT 39.1 07/15/2021   MCV 87.7 07/15/2021   PLT 221 07/15/2021     Chemistry      Component Value Date/Time   NA 136 07/15/2021 1253   K 4.3 07/15/2021 1253   CL 99 07/15/2021 1253   CO2 26 07/15/2021 1253   BUN 24 (H) 07/15/2021 1253   CREATININE 1.09 (H) 07/15/2021 1253       Component Value Date/Time   CALCIUM 10.5 (H) 07/15/2021 1253   ALKPHOS 59 07/15/2021 1253   AST 20 07/15/2021 1253   ALT 14 07/15/2021 1253   BILITOT 0.4 07/15/2021 1253          Impression and Plan:  87 year old woman with:   1.    Stage IV high-grade urothelial carcinoma of the right renal pelvis with pelvic adenopathy documented in September 2022.  She was initially diagnosed with localized disease in 2021.      She is currently on nivolumab without any major complications.  Risks and benefits of continuing this treatment were reviewed at this time.  Potential complications that include nausea, fatigue and immune mediated issues were reiterated.  Alternative treatment options including systemic chemotherapy or antibody drug conjugate among others.  She is agreeable to proceed and we will update her staging scan after November treatment.     2.  IV access: Port-A-Cath remains in place without any issues.       3.  Renal function surveillance: Her kidney function remains at baseline without any decline at this time.  4.  Antiemetics: No nausea or vomiting reported currently.  Compazine is available to her.   5.  Autoimmune complications: She is not experiencing any at this time we will continue to monitor.  Diseases including pneumonitis, colitis and thyroid disease.        6.  Follow-up: will be in the near future to start immunotherapy.   30  minutes were spent on this encounter.  The time was dedicated to reviewing laboratory data, disease status update and outlining future plan of care.       [name], MD 07/15/2021 1:59 PM

## 2021-07-27 DIAGNOSIS — I129 Hypertensive chronic kidney disease with stage 1 through stage 4 chronic kidney disease, or unspecified chronic kidney disease: Secondary | ICD-10-CM | POA: Diagnosis not present

## 2021-07-28 DIAGNOSIS — I129 Hypertensive chronic kidney disease with stage 1 through stage 4 chronic kidney disease, or unspecified chronic kidney disease: Secondary | ICD-10-CM | POA: Diagnosis not present

## 2021-07-28 DIAGNOSIS — E782 Mixed hyperlipidemia: Secondary | ICD-10-CM | POA: Diagnosis not present

## 2021-07-28 DIAGNOSIS — M810 Age-related osteoporosis without current pathological fracture: Secondary | ICD-10-CM | POA: Diagnosis not present

## 2021-08-04 DIAGNOSIS — S42351A Displaced comminuted fracture of shaft of humerus, right arm, initial encounter for closed fracture: Secondary | ICD-10-CM | POA: Diagnosis not present

## 2021-08-04 DIAGNOSIS — S42251A Displaced fracture of greater tuberosity of right humerus, initial encounter for closed fracture: Secondary | ICD-10-CM | POA: Diagnosis not present

## 2021-08-04 DIAGNOSIS — R0902 Hypoxemia: Secondary | ICD-10-CM | POA: Diagnosis not present

## 2021-08-04 DIAGNOSIS — M6281 Muscle weakness (generalized): Secondary | ICD-10-CM | POA: Diagnosis not present

## 2021-08-04 DIAGNOSIS — Z79891 Long term (current) use of opiate analgesic: Secondary | ICD-10-CM | POA: Diagnosis not present

## 2021-08-04 DIAGNOSIS — E785 Hyperlipidemia, unspecified: Secondary | ICD-10-CM | POA: Diagnosis not present

## 2021-08-04 DIAGNOSIS — Z85528 Personal history of other malignant neoplasm of kidney: Secondary | ICD-10-CM | POA: Diagnosis not present

## 2021-08-04 DIAGNOSIS — S42291A Other displaced fracture of upper end of right humerus, initial encounter for closed fracture: Secondary | ICD-10-CM | POA: Diagnosis not present

## 2021-08-04 DIAGNOSIS — Z79899 Other long term (current) drug therapy: Secondary | ICD-10-CM | POA: Diagnosis not present

## 2021-08-04 DIAGNOSIS — W19XXXA Unspecified fall, initial encounter: Secondary | ICD-10-CM | POA: Diagnosis not present

## 2021-08-04 DIAGNOSIS — G8918 Other acute postprocedural pain: Secondary | ICD-10-CM | POA: Diagnosis not present

## 2021-08-04 DIAGNOSIS — S42211A Unspecified displaced fracture of surgical neck of right humerus, initial encounter for closed fracture: Secondary | ICD-10-CM | POA: Diagnosis not present

## 2021-08-04 DIAGNOSIS — I10 Essential (primary) hypertension: Secondary | ICD-10-CM | POA: Diagnosis not present

## 2021-08-05 DIAGNOSIS — S42351A Displaced comminuted fracture of shaft of humerus, right arm, initial encounter for closed fracture: Secondary | ICD-10-CM | POA: Diagnosis not present

## 2021-08-05 DIAGNOSIS — W19XXXA Unspecified fall, initial encounter: Secondary | ICD-10-CM | POA: Diagnosis not present

## 2021-08-05 DIAGNOSIS — S42251A Displaced fracture of greater tuberosity of right humerus, initial encounter for closed fracture: Secondary | ICD-10-CM | POA: Diagnosis not present

## 2021-08-05 DIAGNOSIS — D72829 Elevated white blood cell count, unspecified: Secondary | ICD-10-CM | POA: Diagnosis not present

## 2021-08-05 DIAGNOSIS — Z85528 Personal history of other malignant neoplasm of kidney: Secondary | ICD-10-CM | POA: Diagnosis not present

## 2021-08-05 DIAGNOSIS — N390 Urinary tract infection, site not specified: Secondary | ICD-10-CM | POA: Diagnosis not present

## 2021-08-05 DIAGNOSIS — E860 Dehydration: Secondary | ICD-10-CM | POA: Diagnosis not present

## 2021-08-05 DIAGNOSIS — S42211A Unspecified displaced fracture of surgical neck of right humerus, initial encounter for closed fracture: Secondary | ICD-10-CM | POA: Diagnosis not present

## 2021-08-05 DIAGNOSIS — K5100 Ulcerative (chronic) pancolitis without complications: Secondary | ICD-10-CM | POA: Diagnosis not present

## 2021-08-05 DIAGNOSIS — S42301A Unspecified fracture of shaft of humerus, right arm, initial encounter for closed fracture: Secondary | ICD-10-CM | POA: Diagnosis not present

## 2021-08-05 DIAGNOSIS — I447 Left bundle-branch block, unspecified: Secondary | ICD-10-CM | POA: Diagnosis not present

## 2021-08-05 DIAGNOSIS — R197 Diarrhea, unspecified: Secondary | ICD-10-CM | POA: Diagnosis not present

## 2021-08-06 DIAGNOSIS — K5100 Ulcerative (chronic) pancolitis without complications: Secondary | ICD-10-CM | POA: Diagnosis not present

## 2021-08-06 DIAGNOSIS — R197 Diarrhea, unspecified: Secondary | ICD-10-CM | POA: Diagnosis not present

## 2021-08-06 DIAGNOSIS — D72829 Elevated white blood cell count, unspecified: Secondary | ICD-10-CM | POA: Diagnosis not present

## 2021-08-06 DIAGNOSIS — N390 Urinary tract infection, site not specified: Secondary | ICD-10-CM | POA: Diagnosis not present

## 2021-08-06 DIAGNOSIS — E860 Dehydration: Secondary | ICD-10-CM | POA: Diagnosis not present

## 2021-08-07 ENCOUNTER — Telehealth: Payer: Self-pay | Admitting: *Deleted

## 2021-08-07 NOTE — Telephone Encounter (Signed)
-----   Message from Wyatt Portela, MD sent at 08/07/2021 11:22 AM EST ----- Ok to reschedule ----- Message ----- From: Rolene Course, RN Sent: 08/07/2021  10:57 AM EST To: Wyatt Portela, MD  Ms Wynetta Emery called & said she fell recently, has broken 2 bones & has a cast on her arm, is also having a hard time walking.  She is asking if her infusion on 11/15 can be rescheduled for another week.  Please advise.  Thanks, Bethena Roys

## 2021-08-07 NOTE — Telephone Encounter (Signed)
PC to patient, informed her that per Dr. Alen Blew her appointment can be changed.  Scheduling message sent.

## 2021-08-10 DIAGNOSIS — S42201A Unspecified fracture of upper end of right humerus, initial encounter for closed fracture: Secondary | ICD-10-CM | POA: Diagnosis not present

## 2021-08-11 ENCOUNTER — Ambulatory Visit: Payer: Medicare PPO | Admitting: Oncology

## 2021-08-11 ENCOUNTER — Ambulatory Visit: Payer: Medicare PPO

## 2021-08-11 ENCOUNTER — Other Ambulatory Visit: Payer: Medicare PPO

## 2021-08-12 ENCOUNTER — Other Ambulatory Visit: Payer: Medicare PPO

## 2021-08-13 DIAGNOSIS — I129 Hypertensive chronic kidney disease with stage 1 through stage 4 chronic kidney disease, or unspecified chronic kidney disease: Secondary | ICD-10-CM | POA: Diagnosis not present

## 2021-08-13 DIAGNOSIS — N183 Chronic kidney disease, stage 3 unspecified: Secondary | ICD-10-CM | POA: Diagnosis not present

## 2021-08-13 DIAGNOSIS — Z6825 Body mass index (BMI) 25.0-25.9, adult: Secondary | ICD-10-CM | POA: Diagnosis not present

## 2021-08-13 DIAGNOSIS — S42209A Unspecified fracture of upper end of unspecified humerus, initial encounter for closed fracture: Secondary | ICD-10-CM | POA: Diagnosis not present

## 2021-08-21 ENCOUNTER — Other Ambulatory Visit: Payer: Medicare PPO

## 2021-08-21 ENCOUNTER — Ambulatory Visit: Payer: Medicare PPO

## 2021-08-27 DIAGNOSIS — E782 Mixed hyperlipidemia: Secondary | ICD-10-CM | POA: Diagnosis not present

## 2021-08-27 DIAGNOSIS — I129 Hypertensive chronic kidney disease with stage 1 through stage 4 chronic kidney disease, or unspecified chronic kidney disease: Secondary | ICD-10-CM | POA: Diagnosis not present

## 2021-08-27 DIAGNOSIS — M810 Age-related osteoporosis without current pathological fracture: Secondary | ICD-10-CM | POA: Diagnosis not present

## 2021-08-31 ENCOUNTER — Inpatient Hospital Stay: Payer: Medicare PPO

## 2021-08-31 ENCOUNTER — Other Ambulatory Visit: Payer: Self-pay

## 2021-08-31 ENCOUNTER — Inpatient Hospital Stay: Payer: Medicare PPO | Attending: Oncology

## 2021-08-31 VITALS — BP 154/69 | HR 69 | Temp 98.2°F | Resp 18

## 2021-08-31 DIAGNOSIS — C641 Malignant neoplasm of right kidney, except renal pelvis: Secondary | ICD-10-CM

## 2021-08-31 DIAGNOSIS — Z95828 Presence of other vascular implants and grafts: Secondary | ICD-10-CM

## 2021-08-31 DIAGNOSIS — Z5112 Encounter for antineoplastic immunotherapy: Secondary | ICD-10-CM | POA: Insufficient documentation

## 2021-08-31 DIAGNOSIS — Z79899 Other long term (current) drug therapy: Secondary | ICD-10-CM | POA: Insufficient documentation

## 2021-08-31 DIAGNOSIS — C649 Malignant neoplasm of unspecified kidney, except renal pelvis: Secondary | ICD-10-CM

## 2021-08-31 LAB — CMP (CANCER CENTER ONLY)
ALT: 12 U/L (ref 0–44)
AST: 19 U/L (ref 15–41)
Albumin: 3.8 g/dL (ref 3.5–5.0)
Alkaline Phosphatase: 82 U/L (ref 38–126)
Anion gap: 12 (ref 5–15)
BUN: 24 mg/dL — ABNORMAL HIGH (ref 8–23)
CO2: 25 mmol/L (ref 22–32)
Calcium: 10.1 mg/dL (ref 8.9–10.3)
Chloride: 98 mmol/L (ref 98–111)
Creatinine: 1.04 mg/dL — ABNORMAL HIGH (ref 0.44–1.00)
GFR, Estimated: 51 mL/min — ABNORMAL LOW (ref >60)
Glucose, Bld: 86 mg/dL (ref 70–99)
Potassium: 3.9 mmol/L (ref 3.5–5.1)
Sodium: 135 mmol/L (ref 135–145)
Total Bilirubin: 0.6 mg/dL (ref 0.3–1.2)
Total Protein: 6.9 g/dL (ref 6.5–8.1)

## 2021-08-31 LAB — CBC WITH DIFFERENTIAL (CANCER CENTER ONLY)
Abs Immature Granulocytes: 0.02 10*3/uL (ref 0.00–0.07)
Basophils Absolute: 0.1 10*3/uL (ref 0.0–0.1)
Basophils Relative: 1 %
Eosinophils Absolute: 0.9 10*3/uL — ABNORMAL HIGH (ref 0.0–0.5)
Eosinophils Relative: 12 %
HCT: 36.1 % (ref 36.0–46.0)
Hemoglobin: 11.6 g/dL — ABNORMAL LOW (ref 12.0–15.0)
Immature Granulocytes: 0 %
Lymphocytes Relative: 22 %
Lymphs Abs: 1.7 10*3/uL (ref 0.7–4.0)
MCH: 28.6 pg (ref 26.0–34.0)
MCHC: 32.1 g/dL (ref 30.0–36.0)
MCV: 88.9 fL (ref 80.0–100.0)
Monocytes Absolute: 0.7 10*3/uL (ref 0.1–1.0)
Monocytes Relative: 9 %
Neutro Abs: 4.4 10*3/uL (ref 1.7–7.7)
Neutrophils Relative %: 56 %
Platelet Count: 337 10*3/uL (ref 150–400)
RBC: 4.06 MIL/uL (ref 3.87–5.11)
RDW: 14.2 % (ref 11.5–15.5)
WBC Count: 7.9 10*3/uL (ref 4.0–10.5)
nRBC: 0 % (ref 0.0–0.2)

## 2021-08-31 LAB — TSH: TSH: 1.524 u[IU]/mL (ref 0.308–3.960)

## 2021-08-31 MED ORDER — SODIUM CHLORIDE 0.9% FLUSH
10.0000 mL | INTRAVENOUS | Status: DC | PRN
  Administered 2021-08-31: 10 mL

## 2021-08-31 MED ORDER — SODIUM CHLORIDE 0.9 % IV SOLN
480.0000 mg | Freq: Once | INTRAVENOUS | Status: AC
  Administered 2021-08-31: 480 mg via INTRAVENOUS
  Filled 2021-08-31: qty 48

## 2021-08-31 MED ORDER — SODIUM CHLORIDE 0.9% FLUSH
10.0000 mL | Freq: Once | INTRAVENOUS | Status: AC
  Administered 2021-08-31: 10 mL

## 2021-08-31 MED ORDER — SODIUM CHLORIDE 0.9 % IV SOLN: Freq: Once | INTRAVENOUS | Status: AC

## 2021-08-31 MED ORDER — HEPARIN SOD (PORK) LOCK FLUSH 100 UNIT/ML IV SOLN
500.0000 [IU] | Freq: Once | INTRAVENOUS | Status: AC | PRN
  Administered 2021-08-31: 500 [IU]

## 2021-08-31 NOTE — Patient Instructions (Signed)
[facility]   Discharge Instructions: Thank you for choosing [facility] to provide your oncology and hematology care.   If you have a lab appointment with the [facility], please go directly to the [facility] and check in at the registration area.   Wear comfortable clothing and clothing appropriate for easy access to any Portacath or PICC line.   We strive to give you quality time with your provider. You may need to reschedule your appointment if you arrive late (15 or more minutes).  Arriving late affects you and other patients whose appointments are after yours.  Also, if you miss three or more appointments without notifying the office, you may be dismissed from the clinic at the provider's discretion.      For prescription refill requests, have your pharmacy contact our office and allow 72 hours for refills to be completed.    Today you received the following chemotherapy and/or immunotherapy agents: Nivolumab (Opdivo).      To help prevent nausea and vomiting after your treatment, we encourage you to take your nausea medication as directed.  BELOW ARE SYMPTOMS THAT SHOULD BE REPORTED IMMEDIATELY: *FEVER GREATER THAN 100.4 F (38 C) OR HIGHER *CHILLS OR SWEATING *NAUSEA AND VOMITING THAT IS NOT CONTROLLED WITH YOUR NAUSEA MEDICATION *UNUSUAL SHORTNESS OF BREATH *UNUSUAL BRUISING OR BLEEDING *URINARY PROBLEMS (pain or burning when urinating, or frequent urination) *BOWEL PROBLEMS (unusual diarrhea, constipation, pain near the anus) TENDERNESS IN MOUTH AND THROAT WITH OR WITHOUT PRESENCE OF ULCERS (sore throat, sores in mouth, or a toothache) UNUSUAL RASH, SWELLING OR PAIN  UNUSUAL VAGINAL DISCHARGE OR ITCHING   Items with * indicate a potential emergency and should be followed up as soon as possible or go to the Emergency Department if any problems should occur.  Please show the CHEMOTHERAPY ALERT CARD or IMMUNOTHERAPY ALERT CARD at  check-in to the Emergency Department and triage nurse.  Should you have questions after your visit or need to cancel or reschedule your appointment, please contact [facility]  Dept: [phone]  and follow the prompts.  Office hours are 8:00 a.m. to 4:30 p.m. Monday - Friday. Please note that voicemails left after 4:00 p.m. may not be returned until the following business day.  We are closed weekends and major holidays. You have access to a nurse at all times for urgent questions. Please call the main number to the clinic Dept: [phone] and follow the prompts.   For any non-urgent questions, you may also contact your provider using MyChart. We now offer e-Visits for anyone 20 and older to request care online for non-urgent symptoms. For details visit [email or web address].   Also download the MyChart app! Go to the app store, search "MyChart", open the app, select [facility], and log in with your MyChart username and password.  Due to Covid, a mask is required upon entering the hospital/clinic. If you do not have a mask, one will be given to you upon arrival. For doctor visits, patients may have 1 support person aged 50 or older with them. For treatment visits, patients cannot have anyone with them due to current Covid guidelines and our immunocompromised population.    Nivolumab injection What is this medication? NIVOLUMAB (nye VOL ue mab) is a monoclonal antibody. It treats certain types of cancer. Some of the cancers treated are colon cancer, head and neck cancer, Hodgkin lymphoma, lung cancer, and melanoma. This medicine may be used for other  purposes; ask your health care provider or pharmacist if you have questions. COMMON BRAND NAME(S): Opdivo What should I tell my care team before I take this medication? They need to know if you have any of these conditions: autoimmune diseases like Crohn's disease, ulcerative colitis, or lupus have had or planning  to have an allogeneic stem cell transplant (uses someone else's stem cells) history of chest radiation history of organ transplant nervous system problems like myasthenia gravis or Guillain-Barre syndrome an unusual or allergic reaction to nivolumab, other medicines, foods, dyes, or preservatives pregnant or trying to get pregnant breast-feeding How should I use this medication? This medicine is for infusion into a vein. It is given by a health care professional in a hospital or clinic setting. A special MedGuide will be given to you before each treatment. Be sure to read this information carefully each time. Talk to your pediatrician regarding the use of this medicine in children. While this drug may be prescribed for children as young as 12 years for selected conditions, precautions do apply. Overdosage: If you think you have taken too much of this medicine contact a poison control center or emergency room at once. NOTE: This medicine is only for you. Do not share this medicine with others. What if I miss a dose? It is important not to miss your dose. Call your doctor or health care professional if you are unable to keep an appointment. What may interact with this medication? Interactions have not been studied. This list may not describe all possible interactions. Give your health care provider a list of all the medicines, herbs, non-prescription drugs, or dietary supplements you use. Also tell them if you smoke, drink alcohol, or use illegal drugs. Some items may interact with your medicine. What should I watch for while using this medication? This drug may make you feel generally unwell. Continue your course of treatment even though you feel ill unless your doctor tells you to stop. You may need blood work done while you are taking this medicine. Do not become pregnant while taking this medicine or for 5 months after stopping it. Women should inform their doctor if they wish to become  pregnant or think they might be pregnant. There is a potential for serious side effects to an unborn child. Talk to your health care professional or pharmacist for more information. Do not breast-feed an infant while taking this medicine or for 5 months after stopping it. What side effects may I notice from receiving this medication? Side effects that you should report to your doctor or health care professional as soon as possible: allergic reactions like skin rash, itching or hives, swelling of the face, lips, or tongue breathing problems blood in the urine bloody or watery diarrhea or black, tarry stools changes in emotions or moods changes in vision chest pain cough dizziness feeling faint or lightheaded, falls fever, chills headache with fever, neck stiffness, confusion, loss of memory, sensitivity to light, hallucination, loss of contact with reality, or seizures joint pain mouth sores redness, blistering, peeling or loosening of the skin, including inside the mouth severe muscle pain or weakness signs and symptoms of high blood sugar such as dizziness; dry mouth; dry skin; fruity breath; nausea; stomach pain; increased hunger or thirst; increased urination signs and symptoms of kidney injury like trouble passing urine or change in the amount of urine signs and symptoms of liver injury like dark yellow or brown urine; general ill feeling or flu-like symptoms; light-colored stools; loss  of appetite; nausea; right upper belly pain; unusually weak or tired; yellowing of the eyes or skin swelling of the ankles, feet, hands trouble passing urine or change in the amount of urine unusually weak or tired weight gain or loss Side effects that usually do not require medical attention (report to your doctor or health care professional if they continue or are bothersome): bone pain constipation decreased appetite diarrhea muscle pain nausea, vomiting tiredness This list may not describe  all possible side effects. Call your doctor for medical advice about side effects. You may report side effects to FDA at 1-800-FDA-1088. Where should I keep my medication? This drug is given in a hospital or clinic and will not be stored at home. NOTE: This sheet is a summary. It may not cover all possible information. If you have questions about this medicine, talk to your doctor, pharmacist, or health care provider.  2022 Elsevier/Gold Standard (2020-01-16 10:08:25)

## 2021-08-31 NOTE — Progress Notes (Signed)
While RN was deaccessing patient's port, it was noted that the skin was extremely thin over port site. Kaitlyn, [place], came to assess patient's port. Not signs of infection were noted, patient was advised to take a picture at home to keep a log of any change in status of site. Patient verbalized understanding.

## 2021-09-04 DIAGNOSIS — S42201A Unspecified fracture of upper end of right humerus, initial encounter for closed fracture: Secondary | ICD-10-CM | POA: Diagnosis not present

## 2021-09-09 ENCOUNTER — Ambulatory Visit: Payer: Medicare PPO | Admitting: Oncology

## 2021-09-09 ENCOUNTER — Other Ambulatory Visit: Payer: Medicare PPO

## 2021-09-09 ENCOUNTER — Ambulatory Visit: Payer: Medicare PPO

## 2021-09-22 DIAGNOSIS — S42201A Unspecified fracture of upper end of right humerus, initial encounter for closed fracture: Secondary | ICD-10-CM | POA: Diagnosis not present

## 2021-09-24 ENCOUNTER — Ambulatory Visit: Payer: Medicare PPO | Admitting: Oncology

## 2021-09-24 ENCOUNTER — Other Ambulatory Visit: Payer: Medicare PPO

## 2021-09-27 DIAGNOSIS — E782 Mixed hyperlipidemia: Secondary | ICD-10-CM | POA: Diagnosis not present

## 2021-09-27 DIAGNOSIS — M810 Age-related osteoporosis without current pathological fracture: Secondary | ICD-10-CM | POA: Diagnosis not present

## 2021-09-27 DIAGNOSIS — I129 Hypertensive chronic kidney disease with stage 1 through stage 4 chronic kidney disease, or unspecified chronic kidney disease: Secondary | ICD-10-CM | POA: Diagnosis not present

## 2021-09-30 ENCOUNTER — Inpatient Hospital Stay: Payer: Medicare PPO

## 2021-09-30 ENCOUNTER — Inpatient Hospital Stay: Payer: Medicare PPO | Admitting: Oncology

## 2021-09-30 ENCOUNTER — Other Ambulatory Visit: Payer: Self-pay

## 2021-09-30 ENCOUNTER — Inpatient Hospital Stay: Payer: Medicare PPO | Attending: Oncology

## 2021-09-30 VITALS — BP 143/93 | HR 79 | Temp 97.7°F | Resp 18 | Ht 65.0 in | Wt 148.6 lb

## 2021-09-30 DIAGNOSIS — C641 Malignant neoplasm of right kidney, except renal pelvis: Secondary | ICD-10-CM | POA: Diagnosis not present

## 2021-09-30 DIAGNOSIS — Z79899 Other long term (current) drug therapy: Secondary | ICD-10-CM | POA: Diagnosis not present

## 2021-09-30 DIAGNOSIS — Z5112 Encounter for antineoplastic immunotherapy: Secondary | ICD-10-CM | POA: Diagnosis not present

## 2021-09-30 DIAGNOSIS — C649 Malignant neoplasm of unspecified kidney, except renal pelvis: Secondary | ICD-10-CM

## 2021-09-30 DIAGNOSIS — C651 Malignant neoplasm of right renal pelvis: Secondary | ICD-10-CM | POA: Insufficient documentation

## 2021-09-30 DIAGNOSIS — Z95828 Presence of other vascular implants and grafts: Secondary | ICD-10-CM

## 2021-09-30 LAB — CMP (CANCER CENTER ONLY)
ALT: 11 U/L (ref 0–44)
AST: 17 U/L (ref 15–41)
Albumin: 4.3 g/dL (ref 3.5–5.0)
Alkaline Phosphatase: 77 U/L (ref 38–126)
Anion gap: 7 (ref 5–15)
BUN: 25 mg/dL — ABNORMAL HIGH (ref 8–23)
CO2: 29 mmol/L (ref 22–32)
Calcium: 10.3 mg/dL (ref 8.9–10.3)
Chloride: 96 mmol/L — ABNORMAL LOW (ref 98–111)
Creatinine: 1.19 mg/dL — ABNORMAL HIGH (ref 0.44–1.00)
GFR, Estimated: 43 mL/min — ABNORMAL LOW (ref >60)
Glucose, Bld: 94 mg/dL (ref 70–99)
Potassium: 4.3 mmol/L (ref 3.5–5.1)
Sodium: 132 mmol/L — ABNORMAL LOW (ref 135–145)
Total Bilirubin: 0.4 mg/dL (ref 0.3–1.2)
Total Protein: 7.2 g/dL (ref 6.5–8.1)

## 2021-09-30 LAB — CBC WITH DIFFERENTIAL (CANCER CENTER ONLY)
Abs Immature Granulocytes: 0.02 10*3/uL (ref 0.00–0.07)
Basophils Absolute: 0.1 10*3/uL (ref 0.0–0.1)
Basophils Relative: 1 %
Eosinophils Absolute: 0.7 10*3/uL — ABNORMAL HIGH (ref 0.0–0.5)
Eosinophils Relative: 11 %
HCT: 38.7 % (ref 36.0–46.0)
Hemoglobin: 12.8 g/dL (ref 12.0–15.0)
Immature Granulocytes: 0 %
Lymphocytes Relative: 27 %
Lymphs Abs: 1.8 10*3/uL (ref 0.7–4.0)
MCH: 28.8 pg (ref 26.0–34.0)
MCHC: 33.1 g/dL (ref 30.0–36.0)
MCV: 87 fL (ref 80.0–100.0)
Monocytes Absolute: 0.6 10*3/uL (ref 0.1–1.0)
Monocytes Relative: 9 %
Neutro Abs: 3.4 10*3/uL (ref 1.7–7.7)
Neutrophils Relative %: 52 %
Platelet Count: 273 10*3/uL (ref 150–400)
RBC: 4.45 MIL/uL (ref 3.87–5.11)
RDW: 14.1 % (ref 11.5–15.5)
WBC Count: 6.7 10*3/uL (ref 4.0–10.5)
nRBC: 0 % (ref 0.0–0.2)

## 2021-09-30 LAB — TSH: TSH: 2.868 u[IU]/mL (ref 0.308–3.960)

## 2021-09-30 MED ORDER — SODIUM CHLORIDE 0.9 % IV SOLN
480.0000 mg | Freq: Once | INTRAVENOUS | Status: AC
  Administered 2021-09-30: 480 mg via INTRAVENOUS
  Filled 2021-09-30: qty 48

## 2021-09-30 MED ORDER — SODIUM CHLORIDE 0.9% FLUSH
10.0000 mL | Freq: Once | INTRAVENOUS | Status: AC
  Administered 2021-09-30: 10 mL

## 2021-09-30 MED ORDER — HEPARIN SOD (PORK) LOCK FLUSH 100 UNIT/ML IV SOLN
500.0000 [IU] | Freq: Once | INTRAVENOUS | Status: AC | PRN
  Administered 2021-09-30: 500 [IU]

## 2021-09-30 MED ORDER — SODIUM CHLORIDE 0.9% FLUSH
10.0000 mL | INTRAVENOUS | Status: DC | PRN
  Administered 2021-09-30: 10 mL

## 2021-09-30 MED ORDER — SODIUM CHLORIDE 0.9 % IV SOLN: Freq: Once | INTRAVENOUS | Status: AC

## 2021-09-30 NOTE — Patient Instructions (Signed)
[facility]   Discharge Instructions: Thank you for choosing [facility] to provide your oncology and hematology care.   If you have a lab appointment with the [facility], please go directly to the [facility] and check in at the registration area.   Wear comfortable clothing and clothing appropriate for easy access to any Portacath or PICC line.   We strive to give you quality time with your provider. You may need to reschedule your appointment if you arrive late (15 or more minutes).  Arriving late affects you and other patients whose appointments are after yours.  Also, if you miss three or more appointments without notifying the office, you may be dismissed from the clinic at the providers discretion.      For prescription refill requests, have your pharmacy contact our office and allow 72 hours for refills to be completed.    Today you received the following chemotherapy and/or immunotherapy agents: Nivolumab (Opdivo).      To help prevent nausea and vomiting after your treatment, we encourage you to take your nausea medication as directed.  BELOW ARE SYMPTOMS THAT SHOULD BE REPORTED IMMEDIATELY: *FEVER GREATER THAN 100.4 F (38 C) OR HIGHER *CHILLS OR SWEATING *NAUSEA AND VOMITING THAT IS NOT CONTROLLED WITH YOUR NAUSEA MEDICATION *UNUSUAL SHORTNESS OF BREATH *UNUSUAL BRUISING OR BLEEDING *URINARY PROBLEMS (pain or burning when urinating, or frequent urination) *BOWEL PROBLEMS (unusual diarrhea, constipation, pain near the anus) TENDERNESS IN MOUTH AND THROAT WITH OR WITHOUT PRESENCE OF ULCERS (sore throat, sores in mouth, or a toothache) UNUSUAL RASH, SWELLING OR PAIN  UNUSUAL VAGINAL DISCHARGE OR ITCHING   Items with * indicate a potential emergency and should be followed up as soon as possible or go to the Emergency Department if any problems should occur.  Please show the CHEMOTHERAPY ALERT CARD or IMMUNOTHERAPY ALERT CARD at  check-in to the Emergency Department and triage nurse.  Should you have questions after your visit or need to cancel or reschedule your appointment, please contact [facility]  Dept: [phone]  and follow the prompts.  Office hours are 8:00 a.m. to 4:30 p.m. Monday - Friday. Please note that voicemails left after 4:00 p.m. may not be returned until the following business day.  We are closed weekends and major holidays. You have access to a nurse at all times for urgent questions. Please call the main number to the clinic Dept: [phone] and follow the prompts.   For any non-urgent questions, you may also contact your provider using MyChart. We now offer e-Visits for anyone 76 and older to request care online for non-urgent symptoms. For details visit [email or web address].   Also download the MyChart app! Go to the app store, search "MyChart", open the app, select [facility], and log in with your MyChart username and password.  Due to Covid, a mask is required upon entering the hospital/clinic. If you do not have a mask, one will be given to you upon arrival. For doctor visits, patients may have 1 support person aged 57 or older with them. For treatment visits, patients cannot have anyone with them due to current Covid guidelines and our immunocompromised population.    Nivolumab injection What is this medication? NIVOLUMAB (nye VOL ue mab) is a monoclonal antibody. It treats certain types of cancer. Some of the cancers treated are colon cancer, head and neck cancer, Hodgkin lymphoma, lung cancer, and melanoma. This medicine may be used for other  purposes; ask your health care provider or pharmacist if you have questions. COMMON BRAND NAME(S): Opdivo What should I tell my care team before I take this medication? They need to know if you have any of these conditions: autoimmune diseases like Crohn's disease, ulcerative colitis, or lupus have had or planning  to have an allogeneic stem cell transplant (uses someone else's stem cells) history of chest radiation history of organ transplant nervous system problems like myasthenia gravis or Guillain-Barre syndrome an unusual or allergic reaction to nivolumab, other medicines, foods, dyes, or preservatives pregnant or trying to get pregnant breast-feeding How should I use this medication? This medicine is for infusion into a vein. It is given by a health care professional in a hospital or clinic setting. A special MedGuide will be given to you before each treatment. Be sure to read this information carefully each time. Talk to your pediatrician regarding the use of this medicine in children. While this drug may be prescribed for children as young as 12 years for selected conditions, precautions do apply. Overdosage: If you think you have taken too much of this medicine contact a poison control center or emergency room at once. NOTE: This medicine is only for you. Do not share this medicine with others. What if I miss a dose? It is important not to miss your dose. Call your doctor or health care professional if you are unable to keep an appointment. What may interact with this medication? Interactions have not been studied. This list may not describe all possible interactions. Give your health care provider a list of all the medicines, herbs, non-prescription drugs, or dietary supplements you use. Also tell them if you smoke, drink alcohol, or use illegal drugs. Some items may interact with your medicine. What should I watch for while using this medication? This drug may make you feel generally unwell. Continue your course of treatment even though you feel ill unless your doctor tells you to stop. You may need blood work done while you are taking this medicine. Do not become pregnant while taking this medicine or for 5 months after stopping it. Women should inform their doctor if they wish to become  pregnant or think they might be pregnant. There is a potential for serious side effects to an unborn child. Talk to your health care professional or pharmacist for more information. Do not breast-feed an infant while taking this medicine or for 5 months after stopping it. What side effects may I notice from receiving this medication? Side effects that you should report to your doctor or health care professional as soon as possible: allergic reactions like skin rash, itching or hives, swelling of the face, lips, or tongue breathing problems blood in the urine bloody or watery diarrhea or black, tarry stools changes in emotions or moods changes in vision chest pain cough dizziness feeling faint or lightheaded, falls fever, chills headache with fever, neck stiffness, confusion, loss of memory, sensitivity to light, hallucination, loss of contact with reality, or seizures joint pain mouth sores redness, blistering, peeling or loosening of the skin, including inside the mouth severe muscle pain or weakness signs and symptoms of high blood sugar such as dizziness; dry mouth; dry skin; fruity breath; nausea; stomach pain; increased hunger or thirst; increased urination signs and symptoms of kidney injury like trouble passing urine or change in the amount of urine signs and symptoms of liver injury like dark yellow or brown urine; general ill feeling or flu-like symptoms; light-colored stools; loss  of appetite; nausea; right upper belly pain; unusually weak or tired; yellowing of the eyes or skin swelling of the ankles, feet, hands trouble passing urine or change in the amount of urine unusually weak or tired weight gain or loss Side effects that usually do not require medical attention (report to your doctor or health care professional if they continue or are bothersome): bone pain constipation decreased appetite diarrhea muscle pain nausea, vomiting tiredness This list may not describe  all possible side effects. Call your doctor for medical advice about side effects. You may report side effects to FDA at 1-800-FDA-1088. Where should I keep my medication? This drug is given in a hospital or clinic and will not be stored at home. NOTE: This sheet is a summary. It may not cover all possible information. If you have questions about this medicine, talk to your doctor, pharmacist, or health care provider.  2022 Elsevier/Gold Standard (2020-01-16 10:08:25)

## 2021-09-30 NOTE — Progress Notes (Signed)
Hematology and Oncology Follow Up   [facility]ON [facility] [id number] 1929/03/13 88 y.o. 09/30/2021 1:26 PM [name], MDHodges, [name], MD      Principle Diagnosis: 88 year old woman with ureteral cancer diagnosed in 2021 with T3N2 disease.  She developed stage IV high-grade urothelial with adenopathy  Prior Therapy:   He is status post robotic assisted right nephroureterectomy and right adrenalectomy completed by Dr. Lovena Neighbours on May 21, 2020.   Adjuvant chemotherapy utilizing carboplatin and gemcitabine started on July 11, 2020.  She completed 4 cycles of therapy in December 2021.   Current therapy: Nivolumab 480 mg every 4 weeks started on June 24, 2021.  She is here for cycle 4 of therapy.  Interim History: Ms. Gerhart is here for follow-up.  Since the last visit, she reports no major changes in her health.  She has sustained a fracture to her right arm that is currently healing.  She denies any recent complications at this time related to nivolumab.  She denies any nausea, vomiting or abdominal pain.  She denies any hospitalizations or illnesses.     Medications: Reviewed without changes. Current Outpatient Medications  Medication Sig Dispense Refill   amLODipine (NORVASC) 5 MG tablet daily.     BioGaia Probiotic (BIOGAIA/GERBER SOOTHE) LIQD Take 5 drops by mouth daily at 8 pm.     fenofibrate 160 MG tablet Take 160 mg by mouth at bedtime.     gabapentin (NEURONTIN) 100 MG capsule 400 mg 3 (three) times daily.     hydrochlorothiazide (MICROZIDE) 12.5 MG capsule Take 12.5 mg by mouth 2 (two) times daily.     lidocaine-prilocaine (EMLA) cream Apply 1 application topically as needed. 30 g 0   Multiple Vitamins-Minerals (PRESERVISION AREDS PO) Take 1 capsule by mouth daily.     Multiple Vitamins-Minerals (PRESERVISION AREDS PO) Take by mouth daily.     phenazopyridine (PYRIDIUM) 200 MG tablet Take 1 tablet (200 mg total) by mouth 3 (three) times daily as needed (for pain with  urination). 30 tablet 0   prochlorperazine (COMPAZINE) 10 MG tablet Take 1 tablet (10 mg total) by mouth every 6 (six) hours as needed for nausea or vomiting. 30 tablet 0   No current facility-administered medications for this visit.     Allergies:  Allergies  Allergen Reactions   Flomax [Tamsulosin Hcl] Nausea Only    Physical examination:  Blood pressure (!) 143/93, pulse 79, temperature 97.7 F (36.5 C), temperature source Temporal, resp. rate 18, height 5\' 5"  (1.651 m), weight 148 lb 9.6 oz (67.4 kg), SpO2 100 %.   ECOG 1    General appearance: Comfortable appearing without any discomfort Head: Normocephalic without any trauma Oropharynx: Mucous membranes are moist and pink without any thrush or ulcers. Eyes: Pupils are equal and round reactive to light. Lymph nodes: No cervical, supraclavicular, inguinal or axillary lymphadenopathy.   Heart:regular rate and rhythm.  S1 and S2 without leg edema. Lung: Clear without any rhonchi or wheezes.  No dullness to percussion. Abdomin: Soft, nontender, nondistended with good bowel sounds.  No hepatosplenomegaly. Musculoskeletal: No joint deformity or effusion.  Full range of motion noted. Neurological: No deficits noted on motor, sensory and deep tendon reflex exam. Skin: No petechial rash or dryness.  Appeared moist.          Lab Results: Lab Results  Component Value Date   WBC 7.9 08/31/2021   HGB 11.6 (L) 08/31/2021   HCT 36.1 08/31/2021   MCV 88.9 08/31/2021   PLT 337 08/31/2021  Chemistry      Component Value Date/Time   NA 135 08/31/2021 1439   K 3.9 08/31/2021 1439   CL 98 08/31/2021 1439   CO2 25 08/31/2021 1439   BUN 24 (H) 08/31/2021 1439   CREATININE 1.04 (H) 08/31/2021 1439      Component Value Date/Time   CALCIUM 10.1 08/31/2021 1439   ALKPHOS 82 08/31/2021 1439   AST 19 08/31/2021 1439   ALT 12 08/31/2021 1439   BILITOT 0.6 08/31/2021 1439          Impression and Plan:  88 year old  woman with:   1.   Stage IV high-grade urothelial carcinoma of the right renal pelvis with pelvic adenopathy documented in September 2022.       Risks and benefits of continuing nivolumab were reiterated at this time.  Complications that include nausea, fatigue and immune mediated issues were discussed.  The plan is to proceed with today and update her staging scan before the next visit.  Different salvage therapy options including chemotherapy, antibiotic drug conjugate among others were reiterated if she has progression of disease.  She is agreeable to proceed.     2.  IV access: Port-A-Cath continues to be in use without any issues.       3.  Renal function surveillance: Creatinine clearance remains close to 50 cc/min.  We will continue to monitor her subsequent therapies.  4.  Antiemetics: Compazine is available to her without any nausea or vomiting.   5.  Autoimmune complications: We will continue to educate her about potential issues including pneumonitis, colitis and thyroid disease.  She is not experiencing any at this time.       6.  Follow-up: In 4 weeks for the next cycle of therapy.   30  minutes were dedicated to this visit.  The time was spent on reviewing laboratory data, disease status update, treatment choices and future plan of care discussion.       [name], MD 09/30/2021 1:26 PM

## 2021-10-01 DIAGNOSIS — I129 Hypertensive chronic kidney disease with stage 1 through stage 4 chronic kidney disease, or unspecified chronic kidney disease: Secondary | ICD-10-CM | POA: Diagnosis not present

## 2021-10-01 DIAGNOSIS — N1832 Chronic kidney disease, stage 3b: Secondary | ICD-10-CM | POA: Diagnosis not present

## 2021-10-01 DIAGNOSIS — C679 Malignant neoplasm of bladder, unspecified: Secondary | ICD-10-CM | POA: Diagnosis not present

## 2021-10-01 DIAGNOSIS — N183 Chronic kidney disease, stage 3 unspecified: Secondary | ICD-10-CM | POA: Diagnosis not present

## 2021-10-15 ENCOUNTER — Telehealth: Payer: Self-pay | Admitting: Oncology

## 2021-10-15 NOTE — Telephone Encounter (Signed)
Sch per 1/4 los, pt aware °

## 2021-10-19 DIAGNOSIS — S42201A Unspecified fracture of upper end of right humerus, initial encounter for closed fracture: Secondary | ICD-10-CM | POA: Diagnosis not present

## 2021-10-22 ENCOUNTER — Ambulatory Visit (HOSPITAL_COMMUNITY)
Admission: RE | Admit: 2021-10-22 | Discharge: 2021-10-22 | Disposition: A | Discharge status: Home / Self Care | Payer: Medicare PPO | Source: Ambulatory Visit | Attending: Oncology | Admitting: Oncology

## 2021-10-22 ENCOUNTER — Other Ambulatory Visit: Payer: Self-pay

## 2021-10-22 DIAGNOSIS — K5711 Diverticulosis of small intestine without perforation or abscess with bleeding: Secondary | ICD-10-CM | POA: Diagnosis not present

## 2021-10-22 DIAGNOSIS — J439 Emphysema, unspecified: Secondary | ICD-10-CM | POA: Diagnosis not present

## 2021-10-22 DIAGNOSIS — R911 Solitary pulmonary nodule: Secondary | ICD-10-CM | POA: Diagnosis not present

## 2021-10-22 DIAGNOSIS — K5641 Fecal impaction: Secondary | ICD-10-CM | POA: Diagnosis not present

## 2021-10-22 DIAGNOSIS — J984 Other disorders of lung: Secondary | ICD-10-CM | POA: Diagnosis not present

## 2021-10-22 DIAGNOSIS — C641 Malignant neoplasm of right kidney, except renal pelvis: Secondary | ICD-10-CM | POA: Diagnosis not present

## 2021-10-22 DIAGNOSIS — J9811 Atelectasis: Secondary | ICD-10-CM | POA: Diagnosis not present

## 2021-10-27 DIAGNOSIS — M25411 Effusion, right shoulder: Secondary | ICD-10-CM | POA: Diagnosis not present

## 2021-10-27 DIAGNOSIS — M25511 Pain in right shoulder: Secondary | ICD-10-CM | POA: Diagnosis not present

## 2021-10-27 DIAGNOSIS — M25611 Stiffness of right shoulder, not elsewhere classified: Secondary | ICD-10-CM | POA: Diagnosis not present

## 2021-10-28 DIAGNOSIS — M810 Age-related osteoporosis without current pathological fracture: Secondary | ICD-10-CM | POA: Diagnosis not present

## 2021-10-28 DIAGNOSIS — E782 Mixed hyperlipidemia: Secondary | ICD-10-CM | POA: Diagnosis not present

## 2021-10-28 DIAGNOSIS — I129 Hypertensive chronic kidney disease with stage 1 through stage 4 chronic kidney disease, or unspecified chronic kidney disease: Secondary | ICD-10-CM | POA: Diagnosis not present

## 2021-10-28 DIAGNOSIS — C799 Secondary malignant neoplasm of unspecified site: Secondary | ICD-10-CM | POA: Diagnosis not present

## 2021-10-29 ENCOUNTER — Other Ambulatory Visit: Payer: Self-pay

## 2021-10-29 ENCOUNTER — Inpatient Hospital Stay: Payer: Medicare PPO

## 2021-10-29 ENCOUNTER — Inpatient Hospital Stay: Payer: Medicare PPO | Attending: Oncology

## 2021-10-29 ENCOUNTER — Inpatient Hospital Stay: Payer: Medicare PPO | Admitting: Oncology

## 2021-10-29 VITALS — BP 136/74 | HR 71 | Temp 97.8°F | Resp 18 | Ht 65.0 in | Wt 151.8 lb

## 2021-10-29 DIAGNOSIS — C641 Malignant neoplasm of right kidney, except renal pelvis: Secondary | ICD-10-CM | POA: Diagnosis not present

## 2021-10-29 DIAGNOSIS — Z79899 Other long term (current) drug therapy: Secondary | ICD-10-CM | POA: Insufficient documentation

## 2021-10-29 DIAGNOSIS — C649 Malignant neoplasm of unspecified kidney, except renal pelvis: Secondary | ICD-10-CM

## 2021-10-29 DIAGNOSIS — Z95828 Presence of other vascular implants and grafts: Secondary | ICD-10-CM

## 2021-10-29 DIAGNOSIS — Z5112 Encounter for antineoplastic immunotherapy: Secondary | ICD-10-CM | POA: Insufficient documentation

## 2021-10-29 DIAGNOSIS — C661 Malignant neoplasm of right ureter: Secondary | ICD-10-CM | POA: Insufficient documentation

## 2021-10-29 LAB — CBC WITH DIFFERENTIAL (CANCER CENTER ONLY)
Abs Immature Granulocytes: 0.01 10*3/uL (ref 0.00–0.07)
Basophils Absolute: 0.1 10*3/uL (ref 0.0–0.1)
Basophils Relative: 1 %
Eosinophils Absolute: 0.4 10*3/uL (ref 0.0–0.5)
Eosinophils Relative: 9 %
HCT: 36.4 % (ref 36.0–46.0)
Hemoglobin: 11.9 g/dL — ABNORMAL LOW (ref 12.0–15.0)
Immature Granulocytes: 0 %
Lymphocytes Relative: 28 %
Lymphs Abs: 1.2 10*3/uL (ref 0.7–4.0)
MCH: 28.4 pg (ref 26.0–34.0)
MCHC: 32.7 g/dL (ref 30.0–36.0)
MCV: 86.9 fL (ref 80.0–100.0)
Monocytes Absolute: 0.6 10*3/uL (ref 0.1–1.0)
Monocytes Relative: 13 %
Neutro Abs: 2.2 10*3/uL (ref 1.7–7.7)
Neutrophils Relative %: 49 %
Platelet Count: 256 10*3/uL (ref 150–400)
RBC: 4.19 MIL/uL (ref 3.87–5.11)
RDW: 14.4 % (ref 11.5–15.5)
WBC Count: 4.5 10*3/uL (ref 4.0–10.5)
nRBC: 0 % (ref 0.0–0.2)

## 2021-10-29 LAB — CMP (CANCER CENTER ONLY)
ALT: 12 U/L (ref 0–44)
AST: 17 U/L (ref 15–41)
Albumin: 4 g/dL (ref 3.5–5.0)
Alkaline Phosphatase: 74 U/L (ref 38–126)
Anion gap: 5 (ref 5–15)
BUN: 25 mg/dL — ABNORMAL HIGH (ref 8–23)
CO2: 31 mmol/L (ref 22–32)
Calcium: 10.3 mg/dL (ref 8.9–10.3)
Chloride: 101 mmol/L (ref 98–111)
Creatinine: 1.14 mg/dL — ABNORMAL HIGH (ref 0.44–1.00)
GFR, Estimated: 46 mL/min — ABNORMAL LOW (ref >60)
Glucose, Bld: 92 mg/dL (ref 70–99)
Potassium: 4.1 mmol/L (ref 3.5–5.1)
Sodium: 137 mmol/L (ref 135–145)
Total Bilirubin: 0.4 mg/dL (ref 0.3–1.2)
Total Protein: 6.7 g/dL (ref 6.5–8.1)

## 2021-10-29 LAB — TSH: TSH: 3.92 u[IU]/mL (ref 0.308–3.960)

## 2021-10-29 MED ORDER — SODIUM CHLORIDE 0.9% FLUSH
10.0000 mL | Freq: Once | INTRAVENOUS | Status: AC
  Administered 2021-10-29: 10 mL

## 2021-10-29 MED ORDER — HEPARIN SOD (PORK) LOCK FLUSH 100 UNIT/ML IV SOLN
500.0000 [IU] | Freq: Once | INTRAVENOUS | Status: AC | PRN
  Administered 2021-10-29: 500 [IU]

## 2021-10-29 MED ORDER — SODIUM CHLORIDE 0.9 % IV SOLN: Freq: Once | INTRAVENOUS | Status: AC

## 2021-10-29 MED ORDER — SODIUM CHLORIDE 0.9% FLUSH
10.0000 mL | INTRAVENOUS | Status: DC | PRN
  Administered 2021-10-29: 10 mL

## 2021-10-29 MED ORDER — SODIUM CHLORIDE 0.9 % IV SOLN
480.0000 mg | Freq: Once | INTRAVENOUS | Status: AC
  Administered 2021-10-29: 480 mg via INTRAVENOUS
  Filled 2021-10-29: qty 48

## 2021-10-29 NOTE — Patient Instructions (Signed)
[facility]   Discharge Instructions: Thank you for choosing [facility] to provide your oncology and hematology care.   If you have a lab appointment with the [facility], please go directly to the [facility] and check in at the registration area.   Wear comfortable clothing and clothing appropriate for easy access to any Portacath or PICC line.   We strive to give you quality time with your provider. You may need to reschedule your appointment if you arrive late (15 or more minutes).  Arriving late affects you and other patients whose appointments are after yours.  Also, if you miss three or more appointments without notifying the office, you may be dismissed from the clinic at the providers discretion.      For prescription refill requests, have your pharmacy contact our office and allow 72 hours for refills to be completed.    Today you received the following chemotherapy and/or immunotherapy agents: Nivolumab (Opdivo).      To help prevent nausea and vomiting after your treatment, we encourage you to take your nausea medication as directed.  BELOW ARE SYMPTOMS THAT SHOULD BE REPORTED IMMEDIATELY: *FEVER GREATER THAN 100.4 F (38 C) OR HIGHER *CHILLS OR SWEATING *NAUSEA AND VOMITING THAT IS NOT CONTROLLED WITH YOUR NAUSEA MEDICATION *UNUSUAL SHORTNESS OF BREATH *UNUSUAL BRUISING OR BLEEDING *URINARY PROBLEMS (pain or burning when urinating, or frequent urination) *BOWEL PROBLEMS (unusual diarrhea, constipation, pain near the anus) TENDERNESS IN MOUTH AND THROAT WITH OR WITHOUT PRESENCE OF ULCERS (sore throat, sores in mouth, or a toothache) UNUSUAL RASH, SWELLING OR PAIN  UNUSUAL VAGINAL DISCHARGE OR ITCHING   Items with * indicate a potential emergency and should be followed up as soon as possible or go to the Emergency Department if any problems should occur.  Please show the CHEMOTHERAPY ALERT CARD or IMMUNOTHERAPY ALERT CARD at  check-in to the Emergency Department and triage nurse.  Should you have questions after your visit or need to cancel or reschedule your appointment, please contact [facility]  Dept: [phone]  and follow the prompts.  Office hours are 8:00 a.m. to 4:30 p.m. Monday - Friday. Please note that voicemails left after 4:00 p.m. may not be returned until the following business day.  We are closed weekends and major holidays. You have access to a nurse at all times for urgent questions. Please call the main number to the clinic Dept: [phone] and follow the prompts.   For any non-urgent questions, you may also contact your provider using MyChart. We now offer e-Visits for anyone 88 and older to request care online for non-urgent symptoms. For details visit [email or web address].   Also download the MyChart app! Go to the app store, search "MyChart", open the app, select [facility], and log in with your MyChart username and password.  Due to Covid, a mask is required upon entering the hospital/clinic. If you do not have a mask, one will be given to you upon arrival. For doctor visits, patients may have 1 support person aged 52 or older with them. For treatment visits, patients cannot have anyone with them due to current Covid guidelines and our immunocompromised population.    Nivolumab injection What is this medication? NIVOLUMAB (nye VOL ue mab) is a monoclonal antibody. It treats certain types of cancer. Some of the cancers treated are colon cancer, head and neck cancer, Hodgkin lymphoma, lung cancer, and melanoma. This medicine may be used for other  purposes; ask your health care provider or pharmacist if you have questions. COMMON BRAND NAME(S): Opdivo What should I tell my care team before I take this medication? They need to know if you have any of these conditions: autoimmune diseases like Crohn's disease, ulcerative colitis, or lupus have had or planning  to have an allogeneic stem cell transplant (uses someone else's stem cells) history of chest radiation history of organ transplant nervous system problems like myasthenia gravis or Guillain-Barre syndrome an unusual or allergic reaction to nivolumab, other medicines, foods, dyes, or preservatives pregnant or trying to get pregnant breast-feeding How should I use this medication? This medicine is for infusion into a vein. It is given by a health care professional in a hospital or clinic setting. A special MedGuide will be given to you before each treatment. Be sure to read this information carefully each time. Talk to your pediatrician regarding the use of this medicine in children. While this drug may be prescribed for children as young as 12 years for selected conditions, precautions do apply. Overdosage: If you think you have taken too much of this medicine contact a poison control center or emergency room at once. NOTE: This medicine is only for you. Do not share this medicine with others. What if I miss a dose? It is important not to miss your dose. Call your doctor or health care professional if you are unable to keep an appointment. What may interact with this medication? Interactions have not been studied. This list may not describe all possible interactions. Give your health care provider a list of all the medicines, herbs, non-prescription drugs, or dietary supplements you use. Also tell them if you smoke, drink alcohol, or use illegal drugs. Some items may interact with your medicine. What should I watch for while using this medication? This drug may make you feel generally unwell. Continue your course of treatment even though you feel ill unless your doctor tells you to stop. You may need blood work done while you are taking this medicine. Do not become pregnant while taking this medicine or for 5 months after stopping it. Women should inform their doctor if they wish to become  pregnant or think they might be pregnant. There is a potential for serious side effects to an unborn child. Talk to your health care professional or pharmacist for more information. Do not breast-feed an infant while taking this medicine or for 5 months after stopping it. What side effects may I notice from receiving this medication? Side effects that you should report to your doctor or health care professional as soon as possible: allergic reactions like skin rash, itching or hives, swelling of the face, lips, or tongue breathing problems blood in the urine bloody or watery diarrhea or black, tarry stools changes in emotions or moods changes in vision chest pain cough dizziness feeling faint or lightheaded, falls fever, chills headache with fever, neck stiffness, confusion, loss of memory, sensitivity to light, hallucination, loss of contact with reality, or seizures joint pain mouth sores redness, blistering, peeling or loosening of the skin, including inside the mouth severe muscle pain or weakness signs and symptoms of high blood sugar such as dizziness; dry mouth; dry skin; fruity breath; nausea; stomach pain; increased hunger or thirst; increased urination signs and symptoms of kidney injury like trouble passing urine or change in the amount of urine signs and symptoms of liver injury like dark yellow or brown urine; general ill feeling or flu-like symptoms; light-colored stools; loss  of appetite; nausea; right upper belly pain; unusually weak or tired; yellowing of the eyes or skin swelling of the ankles, feet, hands trouble passing urine or change in the amount of urine unusually weak or tired weight gain or loss Side effects that usually do not require medical attention (report to your doctor or health care professional if they continue or are bothersome): bone pain constipation decreased appetite diarrhea muscle pain nausea, vomiting tiredness This list may not describe  all possible side effects. Call your doctor for medical advice about side effects. You may report side effects to FDA at 1-800-FDA-1088. Where should I keep my medication? This drug is given in a hospital or clinic and will not be stored at home. NOTE: This sheet is a summary. It may not cover all possible information. If you have questions about this medicine, talk to your doctor, pharmacist, or health care provider.  2022 Elsevier/Gold Standard (2020-01-16 10:08:25)

## 2021-10-29 NOTE — Progress Notes (Signed)
Hematology and Oncology Follow Up   Kayla Bowers [id number] 03-05-1929 88 y.o. 10/29/2021 9:23 AM Kayla Bowers, MDHodges, [name], MD      Principle Diagnosis: 88 year old woman with T3N2 high-grade ureteral cancer diagnosed in 2021. with T3N2 disease.  She developed stage IV with adenopathy in 2022. Prior Therapy:   He is status post robotic assisted right nephroureterectomy and right adrenalectomy completed by Dr. Lovena Neighbours on May 21, 2020.   Adjuvant chemotherapy utilizing carboplatin and gemcitabine started on July 11, 2020.  She completed 4 cycles of therapy in December 2021.   Current therapy: Nivolumab 480 mg every 4 weeks started on June 24, 2021.  She is here for cycle 5 of therapy.  Interim History: [name] returns today for repeat evaluation.  Since her last visit, she reports no major changes in her health.  She denies any nausea, vomiting or abdominal pain.  She denies any recent hospitalizations or illnesses.  She denies any diarrhea or dyspnea on exertion.  Her arm mobility continues to improve.     Medications: Updated on review. Current Outpatient Medications  Medication Sig Dispense Refill   amLODipine (NORVASC) 5 MG tablet daily.     BioGaia Probiotic (BIOGAIA/GERBER SOOTHE) LIQD Take 5 drops by mouth daily at 8 pm.     fenofibrate 160 MG tablet Take 160 mg by mouth at bedtime.     gabapentin (NEURONTIN) 100 MG capsule 400 mg 3 (three) times daily.     hydrochlorothiazide (MICROZIDE) 12.5 MG capsule Take 12.5 mg by mouth 2 (two) times daily.     lidocaine-prilocaine (EMLA) cream Apply 1 application topically as needed. 30 g 0   Multiple Vitamins-Minerals (PRESERVISION AREDS PO) Take 1 capsule by mouth daily.     Multiple Vitamins-Minerals (PRESERVISION AREDS PO) Take by mouth daily.     phenazopyridine (PYRIDIUM) 200 MG tablet Take 1 tablet (200 mg total) by mouth 3 (three) times daily as needed (for pain with urination). 30 tablet 0    prochlorperazine (COMPAZINE) 10 MG tablet Take 1 tablet (10 mg total) by mouth every 6 (six) hours as needed for nausea or vomiting. 30 tablet 0   No current facility-administered medications for this visit.     Allergies:  Allergies  Allergen Reactions   Flomax [Tamsulosin Hcl] Nausea Only    Physical examination:  Blood pressure 136/74, pulse 71, temperature 97.8 F (36.6 C), temperature source Temporal, resp. rate 18, height 5\' 5"  (1.651 m), weight 151 lb 12.8 oz (68.9 kg), SpO2 100 %.    ECOG 1    General appearance: Alert, awake without any distress. Head: Atraumatic without abnormalities Oropharynx: Without any thrush or ulcers. Eyes: No scleral icterus. Lymph nodes: No lymphadenopathy noted in the cervical, supraclavicular, or axillary nodes Heart:regular rate and rhythm, without any murmurs or gallops.   Lung: Clear to auscultation without any rhonchi, wheezes or dullness to percussion. Abdomin: Soft, nontender without any shifting dullness or ascites. Musculoskeletal: No clubbing or cyanosis. Neurological: No motor or sensory deficits. Skin: No rashes or lesions.          Lab Results: Lab Results  Component Value Date   WBC 6.7 09/30/2021   HGB 12.8 09/30/2021   HCT 38.7 09/30/2021   MCV 87.0 09/30/2021   PLT 273 09/30/2021     Chemistry      Component Value Date/Time   NA 132 (L) 09/30/2021 1312   K 4.3 09/30/2021 1312   CL 96 (L) 09/30/2021 1312   CO2 29 09/30/2021  1312   BUN 25 (H) 09/30/2021 1312   CREATININE 1.19 (H) 09/30/2021 1312      Component Value Date/Time   CALCIUM 10.3 09/30/2021 1312   ALKPHOS 77 09/30/2021 1312   AST 17 09/30/2021 1312   ALT 11 09/30/2021 1312   BILITOT 0.4 09/30/2021 1312       IMPRESSION: 1. Interval resolution of retroperitoneal lymphadenopathy seen previously. No new or suspicious findings in the abdomen or pelvis on today's study. 2. 9 mm anterior left upper lobe pulmonary nodule has  increased slightly in size but qualitatively has become more confluent in attenuation during the interval since the prior study. Overall evolution of this nodule is concerning and may reflect metastatic disease or primary bronchogenic neoplasm. As this is borderline for reliable assessment on PET imaging, consider short-term interval follow-up CT chest in 3 months reassess. 3. 6 mm right upper lobe pulmonary nodule is mildly increased in the interval with a new 5 mm ground-glass opacity in the posterior right lower lobe. Close attention on follow-up recommended. 4. Comminuted fracture of the right humeral neck is new in the interval but shows bony callus compatible with nonacute injury. 5. Stable 2 cm left adrenal adenoma. 6. Prominent stool volume throughout the colon. Imaging features could be compatible with clinical constipation. 7. Aortic Atherosclerosis (ICD10-I70.0) and Emphysema (ICD10-J43.9).   Impression and Plan:  88 year old woman with:   1.   Right ureteral cancer diagnosed in 2021.  She subsequently developed stage IV high-grade urothelial carcinoma with pelvic adenopathy documented in September 2022.       CT scan obtained on October 22, 2021 was personally reviewed and showed resolution of her pelvic adenopathy indicating positive response to therapy.  Risks and benefits of continuing this treatment were discussed at this time.  She is agreeable to continue.  Plan is to update her scans in 3 months.  She is agreeable to continue with this plan.     2.  IV access: Port-A-Cath remains in use without any issues.       3.  Renal function surveillance: Continues to have stable baseline kidney function with a creatinine clearance of close to 50 cc/min.   4.  Antiemetics: No nausea or vomiting reported at this time.  Compazine is available to her.   5.  Autoimmune complications: She has not experienced any at this time we will continue to educate her about potential  complications.  6.  Pulmonary nodule: Unclear etiology but could represent metastatic disease versus a primary lung neoplasm.  We will continue to monitor on subsequent scans.       7.  Follow-up: In 1 month for a follow-up visit.   30  minutes were spent on this encounter.  The time was dedicated to reviewing laboratory data, reviewing imaging studies, disease status update and future plan of care discussion.       [name], MD 10/29/2021 9:23 AM

## 2021-11-11 DIAGNOSIS — M25411 Effusion, right shoulder: Secondary | ICD-10-CM | POA: Diagnosis not present

## 2021-11-11 DIAGNOSIS — M25611 Stiffness of right shoulder, not elsewhere classified: Secondary | ICD-10-CM | POA: Diagnosis not present

## 2021-11-11 DIAGNOSIS — M25511 Pain in right shoulder: Secondary | ICD-10-CM | POA: Diagnosis not present

## 2021-11-25 DIAGNOSIS — H353133 Nonexudative age-related macular degeneration, bilateral, advanced atrophic without subfoveal involvement: Secondary | ICD-10-CM | POA: Diagnosis not present

## 2021-11-25 DIAGNOSIS — C799 Secondary malignant neoplasm of unspecified site: Secondary | ICD-10-CM | POA: Diagnosis not present

## 2021-11-25 DIAGNOSIS — I129 Hypertensive chronic kidney disease with stage 1 through stage 4 chronic kidney disease, or unspecified chronic kidney disease: Secondary | ICD-10-CM | POA: Diagnosis not present

## 2021-11-25 DIAGNOSIS — E782 Mixed hyperlipidemia: Secondary | ICD-10-CM | POA: Diagnosis not present

## 2021-11-25 DIAGNOSIS — M810 Age-related osteoporosis without current pathological fracture: Secondary | ICD-10-CM | POA: Diagnosis not present

## 2021-11-26 ENCOUNTER — Inpatient Hospital Stay (HOSPITAL_BASED_OUTPATIENT_CLINIC_OR_DEPARTMENT_OTHER): Payer: Medicare PPO | Admitting: Oncology

## 2021-11-26 ENCOUNTER — Inpatient Hospital Stay: Payer: Medicare PPO

## 2021-11-26 ENCOUNTER — Inpatient Hospital Stay: Payer: Medicare PPO | Attending: Oncology

## 2021-11-26 ENCOUNTER — Other Ambulatory Visit: Payer: Self-pay

## 2021-11-26 VITALS — BP 131/62 | HR 65 | Temp 97.3°F | Resp 18 | Wt 149.4 lb

## 2021-11-26 DIAGNOSIS — Z79899 Other long term (current) drug therapy: Secondary | ICD-10-CM | POA: Insufficient documentation

## 2021-11-26 DIAGNOSIS — C679 Malignant neoplasm of bladder, unspecified: Secondary | ICD-10-CM | POA: Diagnosis not present

## 2021-11-26 DIAGNOSIS — C641 Malignant neoplasm of right kidney, except renal pelvis: Secondary | ICD-10-CM | POA: Diagnosis not present

## 2021-11-26 DIAGNOSIS — Z95828 Presence of other vascular implants and grafts: Secondary | ICD-10-CM

## 2021-11-26 DIAGNOSIS — C649 Malignant neoplasm of unspecified kidney, except renal pelvis: Secondary | ICD-10-CM

## 2021-11-26 DIAGNOSIS — Z5112 Encounter for antineoplastic immunotherapy: Secondary | ICD-10-CM | POA: Insufficient documentation

## 2021-11-26 LAB — CBC WITH DIFFERENTIAL (CANCER CENTER ONLY)
Abs Immature Granulocytes: 0.01 10*3/uL (ref 0.00–0.07)
Basophils Absolute: 0.1 10*3/uL (ref 0.0–0.1)
Basophils Relative: 1 %
Eosinophils Absolute: 0.3 10*3/uL (ref 0.0–0.5)
Eosinophils Relative: 6 %
HCT: 38.6 % (ref 36.0–46.0)
Hemoglobin: 12.5 g/dL (ref 12.0–15.0)
Immature Granulocytes: 0 %
Lymphocytes Relative: 22 %
Lymphs Abs: 1.1 10*3/uL (ref 0.7–4.0)
MCH: 27.6 pg (ref 26.0–34.0)
MCHC: 32.4 g/dL (ref 30.0–36.0)
MCV: 85.2 fL (ref 80.0–100.0)
Monocytes Absolute: 0.7 10*3/uL (ref 0.1–1.0)
Monocytes Relative: 14 %
Neutro Abs: 2.9 10*3/uL (ref 1.7–7.7)
Neutrophils Relative %: 57 %
Platelet Count: 226 10*3/uL (ref 150–400)
RBC: 4.53 MIL/uL (ref 3.87–5.11)
RDW: 14.4 % (ref 11.5–15.5)
WBC Count: 5.1 10*3/uL (ref 4.0–10.5)
nRBC: 0 % (ref 0.0–0.2)

## 2021-11-26 LAB — CMP (CANCER CENTER ONLY)
ALT: 14 U/L (ref 0–44)
AST: 23 U/L (ref 15–41)
Albumin: 4.1 g/dL (ref 3.5–5.0)
Alkaline Phosphatase: 72 U/L (ref 38–126)
Anion gap: 6 (ref 5–15)
BUN: 23 mg/dL (ref 8–23)
CO2: 29 mmol/L (ref 22–32)
Calcium: 10.4 mg/dL — ABNORMAL HIGH (ref 8.9–10.3)
Chloride: 98 mmol/L (ref 98–111)
Creatinine: 1.06 mg/dL — ABNORMAL HIGH (ref 0.44–1.00)
GFR, Estimated: 50 mL/min — ABNORMAL LOW (ref >60)
Glucose, Bld: 90 mg/dL (ref 70–99)
Potassium: 4 mmol/L (ref 3.5–5.1)
Sodium: 133 mmol/L — ABNORMAL LOW (ref 135–145)
Total Bilirubin: 0.5 mg/dL (ref 0.3–1.2)
Total Protein: 6.9 g/dL (ref 6.5–8.1)

## 2021-11-26 LAB — TSH: TSH: 5.258 u[IU]/mL — ABNORMAL HIGH (ref 0.308–3.960)

## 2021-11-26 MED ORDER — SODIUM CHLORIDE 0.9% FLUSH
10.0000 mL | INTRAVENOUS | Status: DC | PRN
  Administered 2021-11-26: 10 mL

## 2021-11-26 MED ORDER — HEPARIN SOD (PORK) LOCK FLUSH 100 UNIT/ML IV SOLN
500.0000 [IU] | Freq: Once | INTRAVENOUS | Status: AC | PRN
  Administered 2021-11-26: 500 [IU]

## 2021-11-26 MED ORDER — SODIUM CHLORIDE 0.9 % IV SOLN: Freq: Once | INTRAVENOUS | Status: AC

## 2021-11-26 MED ORDER — SODIUM CHLORIDE 0.9% FLUSH
10.0000 mL | Freq: Once | INTRAVENOUS | Status: AC
  Administered 2021-11-26: 10 mL

## 2021-11-26 MED ORDER — SODIUM CHLORIDE 0.9 % IV SOLN
480.0000 mg | Freq: Once | INTRAVENOUS | Status: AC
  Administered 2021-11-26: 480 mg via INTRAVENOUS
  Filled 2021-11-26: qty 48

## 2021-11-26 NOTE — Progress Notes (Signed)
Hematology and Oncology Follow Up  ? ?Kayla Bowers ?[id number] ?1929/04/20 88 y.o. ?11/26/2021 8:38 AM ?Kayla Bowers, MDHodges, Kayla Maize, MD  ? ? ? ? ?Principle Diagnosis: 88 year old woman with stage IV high-grade urothelial carcinoma of the ureter diagnosed with pelvic adenopathy in 2022.  She presented with T3N2 disease in 2021. ? ? ? ?Prior Therapy: ?  ?He is status post robotic assisted right nephroureterectomy and right adrenalectomy completed by Dr. Lovena Bowers on May 21, 2020. ?  ?Adjuvant chemotherapy utilizing carboplatin and gemcitabine started on July 11, 2020.  She completed 4 cycles of therapy in December 2021. ?  ?Current therapy: Nivolumab 480 mg every 4 weeks started on June 24, 2021.  She is here for cycle 6 of therapy. ? ?Interim History: Kayla Bowers is here for a follow-up visit.  Since her last visit, she reports no major complaints.-She denies any nausea, vomiting or abdominal pain.  She denies any recent hospitalizations or illnesses.  She denies shortness of breath or difficulty breathing.  Her performance status quality of life remains reasonable.  She continues to have improvement in her upper extremity mobility returned to normal after her recent fracture.  ? ? ? ? ?Medications: Reviewed without changes. ?Current Outpatient Medications  ?Medication Sig Dispense Refill  ? amLODipine (NORVASC) 5 MG tablet daily.    ? BioGaia Probiotic (BIOGAIA/GERBER SOOTHE) LIQD Take 5 drops by mouth daily at 8 pm.    ? fenofibrate 160 MG tablet Take 160 mg by mouth at bedtime.    ? gabapentin (NEURONTIN) 100 MG capsule 400 mg 3 (three) times daily.    ? hydrochlorothiazide (MICROZIDE) 12.5 MG capsule Take 12.5 mg by mouth 2 (two) times daily.    ? lidocaine-prilocaine (EMLA) cream Apply 1 application topically as needed. 30 g 0  ? Multiple Vitamins-Minerals (PRESERVISION AREDS PO) Take 1 capsule by mouth daily.    ? Multiple Vitamins-Minerals (PRESERVISION AREDS PO) Take by mouth daily.    ?  prochlorperazine (COMPAZINE) 10 MG tablet Take 1 tablet (10 mg total) by mouth every 6 (six) hours as needed for nausea or vomiting. 30 tablet 0  ? ?No current facility-administered medications for this visit.  ? ? ? ?Allergies:  ?Allergies  ?Allergen Reactions  ? Flomax [Tamsulosin Hcl] Nausea Only  ? ? ?Physical examination: ? ? ?Blood pressure 131/62, pulse 65, temperature (!) 97.3 ?F (36.3 ?C), resp. rate 18, weight 149 lb 6.4 oz (67.8 kg), SpO2 100 %. ? ? ? ?ECOG 1 ? ? ?General appearance: Comfortable appearing without any discomfort ?Head: Normocephalic without any trauma ?Oropharynx: Mucous membranes are moist and pink without any thrush or ulcers. ?Eyes: Pupils are equal and round reactive to light. ?Lymph nodes: No cervical, supraclavicular, inguinal or axillary lymphadenopathy.   ?Heart:regular rate and rhythm.  S1 and S2 without leg edema. ?Lung: Clear without any rhonchi or wheezes.  No dullness to percussion. ?Abdomin: Soft, nontender, nondistended with good bowel sounds.  No hepatosplenomegaly. ?Musculoskeletal: No joint deformity or effusion.  Full range of motion noted. ?Neurological: No deficits noted on motor, sensory and deep tendon reflex exam. ?Skin: No petechial rash or dryness.  Appeared moist.  ? ? ? ? ? ? ? ? ? ? ?Lab Results: ?Lab Results  ?Component Value Date  ? WBC 4.5 10/29/2021  ? HGB 11.9 (L) 10/29/2021  ? HCT 36.4 10/29/2021  ? MCV 86.9 10/29/2021  ? PLT 256 10/29/2021  ? ?  Chemistry   ?   ?Component Value Date/Time  ? NA 137  10/29/2021 0909  ? K 4.1 10/29/2021 0909  ? CL 101 10/29/2021 0909  ? CO2 31 10/29/2021 0909  ? BUN 25 (H) 10/29/2021 0909  ? CREATININE 1.14 (H) 10/29/2021 6812  ?    ?Component Value Date/Time  ? CALCIUM 10.3 10/29/2021 0909  ? ALKPHOS 74 10/29/2021 0909  ? AST 17 10/29/2021 0909  ? ALT 12 10/29/2021 0909  ? BILITOT 0.4 10/29/2021 7517  ?  ? ? ? ?IMPRESSION: ?1. Interval resolution of retroperitoneal lymphadenopathy seen ?previously. No new or suspicious  findings in the abdomen or pelvis ?on today's study. ?2. 9 mm anterior left upper lobe pulmonary nodule has increased ?slightly in size but qualitatively has become more confluent in ?attenuation during the interval since the prior study. Overall ?evolution of this nodule is concerning and may reflect metastatic ?disease or primary bronchogenic neoplasm. As this is borderline for ?reliable assessment on PET imaging, consider short-term interval ?follow-up CT chest in 3 months reassess. ?3. 6 mm right upper lobe pulmonary nodule is mildly increased in the ?interval with a new 5 mm ground-glass opacity in the posterior right ?lower lobe. Close attention on follow-up recommended. ?4. Comminuted fracture of the right humeral neck is new in the ?interval but shows bony callus compatible with nonacute injury. ?5. Stable 2 cm left adrenal adenoma. ?6. Prominent stool volume throughout the colon. Imaging features ?could be compatible with clinical constipation. ?7. Aortic Atherosclerosis (ICD10-I70.0) and Emphysema (ICD10-J43.9). ? ? ?Impression and Plan: ? ?88 year old woman with: ?  ?1.   IV high-grade urothelial carcinoma of the ureter with pelvic adenopathy diagnosed in September 2022.   ?  ?  ?Risks and benefits of continuing this therapy were reviewed at this time.  Complications include nausea, fatigue and immune mediated issues were reiterated.  She had an excellent response to therapy and the plan is to continue with the same dose and schedule.  We will update her staging scans and 2 months.  She is agreeable to continue at this time. ?  ?  ?2.  IV access: Port-A-Cath currently in use without any issues. ?  ?  ?  ?3.  Renal function surveillance: Creatinine clearance remains stable around 46 cc/min. ? ? ?4.  Antiemetics: Compazine is available to her without any nausea or vomiting. ? ? ?5.  Autoimmune complications: I continue to educate her about potential issues including pneumonitis, colitis and thyroid  disease. ? ?6.  Pulmonary nodule: Currently under monitoring with repeat imaging studies.  Differential diagnosis includes metastatic disease versus a primary lung neoplasm. ?  ?  ?  ?7.  Follow-up:  in 4 weeks for repeat follow-up. ? ? ?30  minutes were spent on this visit.  The time was dedicated to reviewing laboratory data, disease status update, treatment choices and addressing complication related cancer and cancer therapy. ? ?  ?  ? ?[name], MD 11/26/2021 8:38 AM ? ?

## 2021-11-26 NOTE — Patient Instructions (Signed)
[facility]  Discharge Instructions: ?Thank you for choosing [facility] to provide your oncology and hematology care.  ? ?If you have a lab appointment with the Cancer Center, please go directly to the Cancer Center and check in at the registration area. ?  ?Wear comfortable clothing and clothing appropriate for easy access to any Portacath or PICC line.  ? ?We strive to give you quality time with your provider. You may need to reschedule your appointment if you arrive late (15 or more minutes).  Arriving late affects you and other patients whose appointments are after yours.  Also, if you miss three or more appointments without notifying the office, you may be dismissed from the clinic at the provider?s discretion.    ?  ?For prescription refill requests, have your pharmacy contact our office and allow 72 hours for refills to be completed.   ? ?Today you received the following chemotherapy and/or immunotherapy agents Opdivo    ?  ?To help prevent nausea and vomiting after your treatment, we encourage you to take your nausea medication as directed. ? ?BELOW ARE SYMPTOMS THAT SHOULD BE REPORTED IMMEDIATELY: ?*FEVER GREATER THAN 100.4 F (38 ?C) OR HIGHER ?*CHILLS OR SWEATING ?*NAUSEA AND VOMITING THAT IS NOT CONTROLLED WITH YOUR NAUSEA MEDICATION ?*UNUSUAL SHORTNESS OF BREATH ?*UNUSUAL BRUISING OR BLEEDING ?*URINARY PROBLEMS (pain or burning when urinating, or frequent urination) ?*BOWEL PROBLEMS (unusual diarrhea, constipation, pain near the anus) ?TENDERNESS IN MOUTH AND THROAT WITH OR WITHOUT PRESENCE OF ULCERS (sore throat, sores in mouth, or a toothache) ?UNUSUAL RASH, SWELLING OR PAIN  ?UNUSUAL VAGINAL DISCHARGE OR ITCHING  ? ?Items with * indicate a potential emergency and should be followed up as soon as possible or go to the Emergency Department if any problems should occur. ? ?Please show the CHEMOTHERAPY ALERT CARD or IMMUNOTHERAPY ALERT CARD at check-in to the  Emergency Department and triage nurse. ? ?Should you have questions after your visit or need to cancel or reschedule your appointment, please contact [facility]  Dept: [phone]  and follow the prompts.  Office hours are 8:00 a.m. to 4:30 p.m. Monday - Friday. Please note that voicemails left after 4:00 p.m. may not be returned until the following business day.  We are closed weekends and major holidays. You have access to a nurse at all times for urgent questions. Please call the main number to the clinic Dept: [phone] and follow the prompts. ? ? ?For any non-urgent questions, you may also contact your provider using MyChart. We now offer e-Visits for anyone 18 and older to request care online for non-urgent symptoms. For details visit [email or web address]. ?  ?Also download the MyChart app! Go to the app store, search "MyChart", open the app, select [facility], and log in with your MyChart username and password. ? ?Due to Covid, a mask is required upon entering the hospital/clinic. If you do not have a mask, one will be given to you upon arrival. For doctor visits, patients may have 1 support person aged 18 or older with them. For treatment visits, patients cannot have anyone with them due to current Covid guidelines and our immunocompromised population.  ? ?

## 2021-12-03 ENCOUNTER — Telehealth: Payer: Self-pay | Admitting: Oncology

## 2021-12-03 DIAGNOSIS — I129 Hypertensive chronic kidney disease with stage 1 through stage 4 chronic kidney disease, or unspecified chronic kidney disease: Secondary | ICD-10-CM | POA: Diagnosis not present

## 2021-12-03 DIAGNOSIS — N183 Chronic kidney disease, stage 3 unspecified: Secondary | ICD-10-CM | POA: Diagnosis not present

## 2021-12-03 DIAGNOSIS — J309 Allergic rhinitis, unspecified: Secondary | ICD-10-CM | POA: Diagnosis not present

## 2021-12-03 NOTE — Telephone Encounter (Signed)
Scheduled per 03/01 los, patient has been called and voicemail was left. ?

## 2021-12-17 ENCOUNTER — Telehealth: Payer: Self-pay | Admitting: Oncology

## 2021-12-17 NOTE — Telephone Encounter (Signed)
Scheduled per 03/02 los, patient has been called and voicemail was left. 

## 2021-12-24 ENCOUNTER — Inpatient Hospital Stay: Payer: Medicare PPO

## 2021-12-24 ENCOUNTER — Other Ambulatory Visit: Payer: Self-pay

## 2021-12-24 ENCOUNTER — Inpatient Hospital Stay: Payer: Medicare PPO | Admitting: Oncology

## 2021-12-24 VITALS — BP 124/67 | HR 82 | Temp 98.2°F | Resp 17 | Ht 65.0 in | Wt 151.4 lb

## 2021-12-24 DIAGNOSIS — Z95828 Presence of other vascular implants and grafts: Secondary | ICD-10-CM | POA: Diagnosis not present

## 2021-12-24 DIAGNOSIS — C649 Malignant neoplasm of unspecified kidney, except renal pelvis: Secondary | ICD-10-CM

## 2021-12-24 DIAGNOSIS — Z5112 Encounter for antineoplastic immunotherapy: Secondary | ICD-10-CM | POA: Diagnosis not present

## 2021-12-24 DIAGNOSIS — C641 Malignant neoplasm of right kidney, except renal pelvis: Secondary | ICD-10-CM

## 2021-12-24 DIAGNOSIS — C679 Malignant neoplasm of bladder, unspecified: Secondary | ICD-10-CM | POA: Diagnosis not present

## 2021-12-24 DIAGNOSIS — Z79899 Other long term (current) drug therapy: Secondary | ICD-10-CM | POA: Diagnosis not present

## 2021-12-24 LAB — CBC WITH DIFFERENTIAL (CANCER CENTER ONLY)
Abs Immature Granulocytes: 0.01 10*3/uL (ref 0.00–0.07)
Basophils Absolute: 0.1 10*3/uL (ref 0.0–0.1)
Basophils Relative: 1 %
Eosinophils Absolute: 0.4 10*3/uL (ref 0.0–0.5)
Eosinophils Relative: 8 %
HCT: 39 % (ref 36.0–46.0)
Hemoglobin: 12.4 g/dL (ref 12.0–15.0)
Immature Granulocytes: 0 %
Lymphocytes Relative: 30 %
Lymphs Abs: 1.4 10*3/uL (ref 0.7–4.0)
MCH: 27.7 pg (ref 26.0–34.0)
MCHC: 31.8 g/dL (ref 30.0–36.0)
MCV: 87.2 fL (ref 80.0–100.0)
Monocytes Absolute: 0.6 10*3/uL (ref 0.1–1.0)
Monocytes Relative: 14 %
Neutro Abs: 2.1 10*3/uL (ref 1.7–7.7)
Neutrophils Relative %: 47 %
Platelet Count: 245 10*3/uL (ref 150–400)
RBC: 4.47 MIL/uL (ref 3.87–5.11)
RDW: 15.2 % (ref 11.5–15.5)
WBC Count: 4.5 10*3/uL (ref 4.0–10.5)
nRBC: 0 % (ref 0.0–0.2)

## 2021-12-24 LAB — CMP (CANCER CENTER ONLY)
ALT: 18 U/L (ref 0–44)
AST: 26 U/L (ref 15–41)
Albumin: 3.9 g/dL (ref 3.5–5.0)
Alkaline Phosphatase: 59 U/L (ref 38–126)
Anion gap: 5 (ref 5–15)
BUN: 26 mg/dL — ABNORMAL HIGH (ref 8–23)
CO2: 32 mmol/L (ref 22–32)
Calcium: 9.9 mg/dL (ref 8.9–10.3)
Chloride: 104 mmol/L (ref 98–111)
Creatinine: 1.2 mg/dL — ABNORMAL HIGH (ref 0.44–1.00)
GFR, Estimated: 43 mL/min — ABNORMAL LOW (ref >60)
Glucose, Bld: 87 mg/dL (ref 70–99)
Potassium: 4.4 mmol/L (ref 3.5–5.1)
Sodium: 141 mmol/L (ref 135–145)
Total Bilirubin: 0.4 mg/dL (ref 0.3–1.2)
Total Protein: 6.4 g/dL — ABNORMAL LOW (ref 6.5–8.1)

## 2021-12-24 LAB — TSH: TSH: 33.017 u[IU]/mL — ABNORMAL HIGH (ref 0.308–3.960)

## 2021-12-24 MED ORDER — SODIUM CHLORIDE 0.9 % IV SOLN
480.0000 mg | Freq: Once | INTRAVENOUS | Status: AC
  Administered 2021-12-24: 480 mg via INTRAVENOUS
  Filled 2021-12-24: qty 48

## 2021-12-24 MED ORDER — SODIUM CHLORIDE 0.9% FLUSH
10.0000 mL | INTRAVENOUS | Status: DC | PRN
  Administered 2021-12-24: 10 mL

## 2021-12-24 MED ORDER — HEPARIN SOD (PORK) LOCK FLUSH 100 UNIT/ML IV SOLN
500.0000 [IU] | Freq: Once | INTRAVENOUS | Status: AC | PRN
  Administered 2021-12-24: 500 [IU]

## 2021-12-24 MED ORDER — SODIUM CHLORIDE 0.9 % IV SOLN: Freq: Once | INTRAVENOUS | Status: AC

## 2021-12-24 MED ORDER — SODIUM CHLORIDE 0.9% FLUSH
10.0000 mL | Freq: Once | INTRAVENOUS | Status: AC
  Administered 2021-12-24: 10 mL

## 2021-12-24 NOTE — Patient Instructions (Signed)
[facility]  Discharge Instructions: ?Thank you for choosing [facility] to provide your oncology and hematology care.  ? ?If you have a lab appointment with the Cancer Center, please go directly to the Cancer Center and check in at the registration area. ?  ?Wear comfortable clothing and clothing appropriate for easy access to any Portacath or PICC line.  ? ?We strive to give you quality time with your provider. You may need to reschedule your appointment if you arrive late (15 or more minutes).  Arriving late affects you and other patients whose appointments are after yours.  Also, if you miss three or more appointments without notifying the office, you may be dismissed from the clinic at the provider?s discretion.    ?  ?For prescription refill requests, have your pharmacy contact our office and allow 72 hours for refills to be completed.   ? ?Today you received the following chemotherapy and/or immunotherapy agents Opdivo    ?  ?To help prevent nausea and vomiting after your treatment, we encourage you to take your nausea medication as directed. ? ?BELOW ARE SYMPTOMS THAT SHOULD BE REPORTED IMMEDIATELY: ?*FEVER GREATER THAN 100.4 F (38 ?C) OR HIGHER ?*CHILLS OR SWEATING ?*NAUSEA AND VOMITING THAT IS NOT CONTROLLED WITH YOUR NAUSEA MEDICATION ?*UNUSUAL SHORTNESS OF BREATH ?*UNUSUAL BRUISING OR BLEEDING ?*URINARY PROBLEMS (pain or burning when urinating, or frequent urination) ?*BOWEL PROBLEMS (unusual diarrhea, constipation, pain near the anus) ?TENDERNESS IN MOUTH AND THROAT WITH OR WITHOUT PRESENCE OF ULCERS (sore throat, sores in mouth, or a toothache) ?UNUSUAL RASH, SWELLING OR PAIN  ?UNUSUAL VAGINAL DISCHARGE OR ITCHING  ? ?Items with * indicate a potential emergency and should be followed up as soon as possible or go to the Emergency Department if any problems should occur. ? ?Please show the CHEMOTHERAPY ALERT CARD or IMMUNOTHERAPY ALERT CARD at check-in to the  Emergency Department and triage nurse. ? ?Should you have questions after your visit or need to cancel or reschedule your appointment, please contact [facility]  Dept: [phone]  and follow the prompts.  Office hours are 8:00 a.m. to 4:30 p.m. Monday - Friday. Please note that voicemails left after 4:00 p.m. may not be returned until the following business day.  We are closed weekends and major holidays. You have access to a nurse at all times for urgent questions. Please call the main number to the clinic Dept: [phone] and follow the prompts. ? ? ?For any non-urgent questions, you may also contact your provider using MyChart. We now offer e-Visits for anyone 18 and older to request care online for non-urgent symptoms. For details visit [email or web address]. ?  ?Also download the MyChart app! Go to the app store, search "MyChart", open the app, select , and log in with your MyChart username and password. ? ?Due to Covid, a mask is required upon entering the hospital/clinic. If you do not have a mask, one will be given to you upon arrival. For doctor visits, patients may have 1 support person aged 18 or older with them. For treatment visits, patients cannot have anyone with them due to current Covid guidelines and our immunocompromised population.  ? ?

## 2021-12-24 NOTE — Progress Notes (Signed)
.Hematology and Oncology Follow Up  ? ?Kayla Bowers ?[id number] ?02/16/1929 88 y.o. ?12/24/2021 11:52 AM ?[name], MDHodges, [name], MD  ? ? ? ? ?Principle Diagnosis: 88 year old woman with ureteral cancer diagnosed in 2021 with T3N2 disease.  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   ? ? ? ?Prior Therapy: ?  ?He is status post robotic assisted right nephroureterectomy and right adrenalectomy completed by Dr. Lovena Neighbours on May 21, 2020. ?  ?Adjuvant chemotherapy utilizing carboplatin and gemcitabine started on July 11, 2020.  She completed 4 cycles of therapy in December 2021. ?  ?Current therapy: Nivolumab 480 mg every 4 weeks started on June 24, 2021.  She is here for cycle 7 of therapy. ? ?Interim History: Ms. Shelburne returns today for a repeat evaluation.  Since the last visit, she is reporting more loose bowel habits and occasional diarrhea.  She has reported loose bowel habits 3-5 times a day but does respond to antidiarrheal medication which stops it completely.  She denies any abdominal pain or discomfort.  She denies any nausea, vomiting or bleeding.  Her performance status and quality of life remains unchanged.  She still able to eat and has gained weight since the last visit. ? ? ? ? ?Medications: Updated on review. ?Current Outpatient Medications  ?Medication Sig Dispense Refill  ? amLODipine (NORVASC) 5 MG tablet daily.    ? BioGaia Probiotic (BIOGAIA/GERBER SOOTHE) LIQD Take 5 drops by mouth daily at 8 pm.    ? fenofibrate 160 MG tablet Take 160 mg by mouth at bedtime.    ? gabapentin (NEURONTIN) 100 MG capsule 400 mg 3 (three) times daily.    ? hydrochlorothiazide (MICROZIDE) 12.5 MG capsule Take 12.5 mg by mouth 2 (two) times daily.    ? lidocaine-prilocaine (EMLA) cream Apply 1 application topically as needed. 30 g 0  ? Multiple Vitamins-Minerals (PRESERVISION AREDS PO) Take 1 capsule by mouth daily.    ? Multiple Vitamins-Minerals  (PRESERVISION AREDS PO) Take by mouth daily.    ? prochlorperazine (COMPAZINE) 10 MG tablet Take 1 tablet (10 mg total) by mouth every 6 (six) hours as needed for nausea or vomiting. 30 tablet 0  ? ?No current facility-administered medications for this visit.  ? ? ? ?Allergies:  ?Allergies  ?Allergen Reactions  ? Flomax [Tamsulosin Hcl] Nausea Only  ? ? ?Physical examination: ? ? ? ?Blood pressure 124/67, pulse 82, temperature 98.2 ?F (36.8 ?C), temperature source Temporal, resp. rate 17, height '5\' 5"'$  (1.651 m), weight 151 lb 6.4 oz (68.7 kg), SpO2 98 %. ? ? ? ?ECOG 1 ? ? ? ?General appearance: Alert, awake without any distress. ?Head: Atraumatic without abnormalities ?Oropharynx: Without any thrush or ulcers. ?Eyes: No scleral icterus. ?Lymph nodes: No lymphadenopathy noted in the cervical, supraclavicular, or axillary nodes ?Heart:regular rate and rhythm, without any murmurs or gallops.   ?Lung: Clear to auscultation without any rhonchi, wheezes or dullness to percussion. ?Abdomin: Soft, nontender without any shifting dullness or ascites. ?Musculoskeletal: No clubbing or cyanosis. ?Neurological: No motor or sensory deficits. ?Skin: No rashes or lesions. ? ? ? ? ? ? ? ? ? ? ?Lab Results: ?Lab Results  ?Component Value Date  ? WBC 5.1 11/26/2021  ? HGB 12.5 11/26/2021  ? HCT 38.6 11/26/2021  ? MCV 85.2 11/26/2021  ? PLT 226 11/26/2021  ? ?  Chemistry   ?   ?Component Value Date/Time  ? NA 133 (L) 11/26/2021 0845  ? K 4.0  11/26/2021 0845  ? CL 98 11/26/2021 0845  ? CO2 29 11/26/2021 0845  ? BUN 23 11/26/2021 0845  ? CREATININE 1.06 (H) 11/26/2021 0845  ?    ?Component Value Date/Time  ? CALCIUM 10.4 (H) 11/26/2021 0845  ? ALKPHOS 72 11/26/2021 0845  ? AST 23 11/26/2021 0845  ? ALT 14 11/26/2021 0845  ? BILITOT 0.5 11/26/2021 0845  ?  ? ? ? ? ? ?Impression and Plan: ? ?88 year old woman with: ?  ?1.   Ureteral cancer diagnosed in 2021.  She developed IV high-grade urothelial carcinoma with pelvic adenopathy. ? ?  ?Her  disease status was assessed at this time and treatment choices were reviewed.  Risks and benefits of continuing nivolumab were discussed.  Complications including autoimmune concerns versus GI toxicity and dermatological issues were reiterated.  After discussion today, I have recommended proceeding with the treatment today and suspend treatment given her GI complaints.  I will update her staging scans in May 2023. ?  ? ?  ?  ?2.  IV access: Port-A-Cath continues to be in use without any issues. ?  ?  ?  ?3.  Renal function surveillance: Her creatinine clearance remains stable at this time. ? ? ?4.  Antiemetics: No nausea or vomiting reported at this time.  Compazine is available to her. ? ? ?5.  Autoimmune complications: She has not experienced any issues including pneumonitis, colitis and thyroid disease.  She is having mild colitis at this time.  Recommended antidiarrheal medication and will discontinue immunotherapy given her overall response.  Corticosteroids can be used If Her Symptoms Gets More Severe. ? ?6.  Pulmonary nodule: This will be monitored on future imaging studies.  Differential diagnosis including benign etiology versus primary lung neoplasm versus metastatic disease. ?  ?  ?7.  Follow-up: She will return in the next 4 to 5 weeks for repeat evaluation. ? ? ?30  minutes were dedicated to this encounter.  The time was spent on reviewing laboratory data, disease status update and outlining future plan of care review. ? ?  ?  ? ?[name], MD 12/24/2021 11:52 AM ? ?

## 2021-12-25 ENCOUNTER — Telehealth: Payer: Self-pay | Admitting: *Deleted

## 2021-12-25 DIAGNOSIS — I129 Hypertensive chronic kidney disease with stage 1 through stage 4 chronic kidney disease, or unspecified chronic kidney disease: Secondary | ICD-10-CM | POA: Diagnosis not present

## 2021-12-25 NOTE — Telephone Encounter (Signed)
-----   Message from Wyatt Portela, MD sent at 12/25/2021  2:07 PM EDT ----- ?Regarding: RE: Port assessment ?I will assess next visit ?----- Message ----- ?From: Rolene Course, RN ?Sent: 12/25/2021   2:03 PM EDT ?To: Wyatt Portela, MD ?Subject: Port assessment                               ? ?The infusion nurse who had this patient yesterday called & was a little concerned about her port.  She said the skin over the port site is very thin & the port is visible - it is not tender, does not appear to be infected.  She also noticed this in December & the appearance is unchanged.  She was concerned because the patient's last tx was yesterday.  Will her port eventually be removed?  I see she has a lab/flush appointment in May.  Please advise. ? ?Thanks, ?Bethena Roys ? ? ?

## 2021-12-26 DIAGNOSIS — C679 Malignant neoplasm of bladder, unspecified: Secondary | ICD-10-CM | POA: Diagnosis not present

## 2021-12-26 DIAGNOSIS — C799 Secondary malignant neoplasm of unspecified site: Secondary | ICD-10-CM | POA: Diagnosis not present

## 2021-12-29 ENCOUNTER — Other Ambulatory Visit: Payer: Self-pay | Admitting: Oncology

## 2021-12-29 DIAGNOSIS — C649 Malignant neoplasm of unspecified kidney, except renal pelvis: Secondary | ICD-10-CM

## 2022-01-04 DIAGNOSIS — Z139 Encounter for screening, unspecified: Secondary | ICD-10-CM | POA: Diagnosis not present

## 2022-01-04 DIAGNOSIS — I129 Hypertensive chronic kidney disease with stage 1 through stage 4 chronic kidney disease, or unspecified chronic kidney disease: Secondary | ICD-10-CM | POA: Diagnosis not present

## 2022-01-04 DIAGNOSIS — Z0000 Encounter for general adult medical examination without abnormal findings: Secondary | ICD-10-CM | POA: Diagnosis not present

## 2022-01-04 DIAGNOSIS — Z1339 Encounter for screening examination for other mental health and behavioral disorders: Secondary | ICD-10-CM | POA: Diagnosis not present

## 2022-01-04 DIAGNOSIS — N183 Chronic kidney disease, stage 3 unspecified: Secondary | ICD-10-CM | POA: Diagnosis not present

## 2022-01-04 DIAGNOSIS — E278 Other specified disorders of adrenal gland: Secondary | ICD-10-CM | POA: Diagnosis not present

## 2022-01-04 DIAGNOSIS — Z9181 History of falling: Secondary | ICD-10-CM | POA: Diagnosis not present

## 2022-01-04 DIAGNOSIS — Z136 Encounter for screening for cardiovascular disorders: Secondary | ICD-10-CM | POA: Diagnosis not present

## 2022-01-04 DIAGNOSIS — Z1331 Encounter for screening for depression: Secondary | ICD-10-CM | POA: Diagnosis not present

## 2022-01-11 DIAGNOSIS — R531 Weakness: Secondary | ICD-10-CM | POA: Diagnosis not present

## 2022-01-11 DIAGNOSIS — R279 Unspecified lack of coordination: Secondary | ICD-10-CM | POA: Diagnosis not present

## 2022-01-11 DIAGNOSIS — R296 Repeated falls: Secondary | ICD-10-CM | POA: Diagnosis not present

## 2022-01-14 DIAGNOSIS — R531 Weakness: Secondary | ICD-10-CM | POA: Diagnosis not present

## 2022-01-14 DIAGNOSIS — R296 Repeated falls: Secondary | ICD-10-CM | POA: Diagnosis not present

## 2022-01-14 DIAGNOSIS — R279 Unspecified lack of coordination: Secondary | ICD-10-CM | POA: Diagnosis not present

## 2022-01-19 DIAGNOSIS — R279 Unspecified lack of coordination: Secondary | ICD-10-CM | POA: Diagnosis not present

## 2022-01-19 DIAGNOSIS — R296 Repeated falls: Secondary | ICD-10-CM | POA: Diagnosis not present

## 2022-01-19 DIAGNOSIS — R531 Weakness: Secondary | ICD-10-CM | POA: Diagnosis not present

## 2022-01-21 ENCOUNTER — Ambulatory Visit: Payer: Medicare PPO

## 2022-01-21 ENCOUNTER — Ambulatory Visit: Payer: Medicare PPO | Admitting: Oncology

## 2022-01-21 ENCOUNTER — Other Ambulatory Visit: Payer: Medicare PPO

## 2022-01-21 DIAGNOSIS — R279 Unspecified lack of coordination: Secondary | ICD-10-CM | POA: Diagnosis not present

## 2022-01-21 DIAGNOSIS — R531 Weakness: Secondary | ICD-10-CM | POA: Diagnosis not present

## 2022-01-21 DIAGNOSIS — R296 Repeated falls: Secondary | ICD-10-CM | POA: Diagnosis not present

## 2022-01-24 DIAGNOSIS — I129 Hypertensive chronic kidney disease with stage 1 through stage 4 chronic kidney disease, or unspecified chronic kidney disease: Secondary | ICD-10-CM | POA: Diagnosis not present

## 2022-01-25 DIAGNOSIS — C679 Malignant neoplasm of bladder, unspecified: Secondary | ICD-10-CM | POA: Diagnosis not present

## 2022-01-25 DIAGNOSIS — C799 Secondary malignant neoplasm of unspecified site: Secondary | ICD-10-CM | POA: Diagnosis not present

## 2022-01-26 DIAGNOSIS — R531 Weakness: Secondary | ICD-10-CM | POA: Diagnosis not present

## 2022-01-26 DIAGNOSIS — R296 Repeated falls: Secondary | ICD-10-CM | POA: Diagnosis not present

## 2022-01-26 DIAGNOSIS — R279 Unspecified lack of coordination: Secondary | ICD-10-CM | POA: Diagnosis not present

## 2022-01-28 ENCOUNTER — Other Ambulatory Visit: Payer: Self-pay

## 2022-01-28 ENCOUNTER — Inpatient Hospital Stay: Payer: Medicare PPO | Attending: Oncology

## 2022-01-28 ENCOUNTER — Ambulatory Visit (HOSPITAL_COMMUNITY)
Admission: RE | Admit: 2022-01-28 | Discharge: 2022-01-28 | Disposition: A | Discharge status: Home / Self Care | Payer: Medicare PPO | Source: Ambulatory Visit | Attending: Oncology | Admitting: Oncology

## 2022-01-28 DIAGNOSIS — Z85528 Personal history of other malignant neoplasm of kidney: Secondary | ICD-10-CM | POA: Insufficient documentation

## 2022-01-28 DIAGNOSIS — X58XXXD Exposure to other specified factors, subsequent encounter: Secondary | ICD-10-CM | POA: Diagnosis not present

## 2022-01-28 DIAGNOSIS — Z905 Acquired absence of kidney: Secondary | ICD-10-CM | POA: Insufficient documentation

## 2022-01-28 DIAGNOSIS — M4185 Other forms of scoliosis, thoracolumbar region: Secondary | ICD-10-CM | POA: Diagnosis not present

## 2022-01-28 DIAGNOSIS — I700 Atherosclerosis of aorta: Secondary | ICD-10-CM | POA: Insufficient documentation

## 2022-01-28 DIAGNOSIS — R918 Other nonspecific abnormal finding of lung field: Secondary | ICD-10-CM | POA: Insufficient documentation

## 2022-01-28 DIAGNOSIS — S42201D Unspecified fracture of upper end of right humerus, subsequent encounter for fracture with routine healing: Secondary | ICD-10-CM | POA: Insufficient documentation

## 2022-01-28 DIAGNOSIS — M12852 Other specific arthropathies, not elsewhere classified, left hip: Secondary | ICD-10-CM | POA: Diagnosis not present

## 2022-01-28 DIAGNOSIS — C641 Malignant neoplasm of right kidney, except renal pelvis: Secondary | ICD-10-CM | POA: Insufficient documentation

## 2022-01-28 DIAGNOSIS — Z95828 Presence of other vascular implants and grafts: Secondary | ICD-10-CM

## 2022-01-28 DIAGNOSIS — D3502 Benign neoplasm of left adrenal gland: Secondary | ICD-10-CM | POA: Insufficient documentation

## 2022-01-28 DIAGNOSIS — E039 Hypothyroidism, unspecified: Secondary | ICD-10-CM | POA: Insufficient documentation

## 2022-01-28 DIAGNOSIS — I2510 Atherosclerotic heart disease of native coronary artery without angina pectoris: Secondary | ICD-10-CM | POA: Diagnosis not present

## 2022-01-28 DIAGNOSIS — C679 Malignant neoplasm of bladder, unspecified: Secondary | ICD-10-CM | POA: Insufficient documentation

## 2022-01-28 DIAGNOSIS — I517 Cardiomegaly: Secondary | ICD-10-CM | POA: Diagnosis not present

## 2022-01-28 DIAGNOSIS — E896 Postprocedural adrenocortical (-medullary) hypofunction: Secondary | ICD-10-CM | POA: Insufficient documentation

## 2022-01-28 DIAGNOSIS — Z79899 Other long term (current) drug therapy: Secondary | ICD-10-CM | POA: Insufficient documentation

## 2022-01-28 DIAGNOSIS — C649 Malignant neoplasm of unspecified kidney, except renal pelvis: Secondary | ICD-10-CM | POA: Diagnosis present

## 2022-01-28 DIAGNOSIS — M12851 Other specific arthropathies, not elsewhere classified, right hip: Secondary | ICD-10-CM | POA: Insufficient documentation

## 2022-01-28 DIAGNOSIS — K5710 Diverticulosis of small intestine without perforation or abscess without bleeding: Secondary | ICD-10-CM | POA: Insufficient documentation

## 2022-01-28 DIAGNOSIS — K828 Other specified diseases of gallbladder: Secondary | ICD-10-CM | POA: Diagnosis not present

## 2022-01-28 DIAGNOSIS — C669 Malignant neoplasm of unspecified ureter: Secondary | ICD-10-CM | POA: Insufficient documentation

## 2022-01-28 LAB — CBC WITH DIFFERENTIAL (CANCER CENTER ONLY)
Abs Immature Granulocytes: 0.01 10*3/uL (ref 0.00–0.07)
Basophils Absolute: 0.1 10*3/uL (ref 0.0–0.1)
Basophils Relative: 1 %
Eosinophils Absolute: 0.5 10*3/uL (ref 0.0–0.5)
Eosinophils Relative: 7 %
HCT: 40.1 % (ref 36.0–46.0)
Hemoglobin: 13.2 g/dL (ref 12.0–15.0)
Immature Granulocytes: 0 %
Lymphocytes Relative: 25 %
Lymphs Abs: 1.7 10*3/uL (ref 0.7–4.0)
MCH: 28.6 pg (ref 26.0–34.0)
MCHC: 32.9 g/dL (ref 30.0–36.0)
MCV: 86.8 fL (ref 80.0–100.0)
Monocytes Absolute: 0.6 10*3/uL (ref 0.1–1.0)
Monocytes Relative: 10 %
Neutro Abs: 3.7 10*3/uL (ref 1.7–7.7)
Neutrophils Relative %: 57 %
Platelet Count: 246 10*3/uL (ref 150–400)
RBC: 4.62 MIL/uL (ref 3.87–5.11)
RDW: 15.9 % — ABNORMAL HIGH (ref 11.5–15.5)
WBC Count: 6.5 10*3/uL (ref 4.0–10.5)
nRBC: 0 % (ref 0.0–0.2)

## 2022-01-28 LAB — CMP (CANCER CENTER ONLY)
ALT: 21 U/L (ref 0–44)
AST: 29 U/L (ref 15–41)
Albumin: 4.2 g/dL (ref 3.5–5.0)
Alkaline Phosphatase: 59 U/L (ref 38–126)
Anion gap: 6 (ref 5–15)
BUN: 33 mg/dL — ABNORMAL HIGH (ref 8–23)
CO2: 30 mmol/L (ref 22–32)
Calcium: 10.2 mg/dL (ref 8.9–10.3)
Chloride: 99 mmol/L (ref 98–111)
Creatinine: 1.41 mg/dL — ABNORMAL HIGH (ref 0.44–1.00)
GFR, Estimated: 35 mL/min — ABNORMAL LOW (ref >60)
Glucose, Bld: 107 mg/dL — ABNORMAL HIGH (ref 70–99)
Potassium: 4.4 mmol/L (ref 3.5–5.1)
Sodium: 135 mmol/L (ref 135–145)
Total Bilirubin: 0.4 mg/dL (ref 0.3–1.2)
Total Protein: 6.8 g/dL (ref 6.5–8.1)

## 2022-01-28 LAB — TSH: TSH: 132.51 u[IU]/mL — ABNORMAL HIGH (ref 0.350–4.500)

## 2022-01-28 MED ORDER — SODIUM CHLORIDE 0.9% FLUSH
10.0000 mL | Freq: Once | INTRAVENOUS | Status: AC
  Administered 2022-01-28: 10 mL

## 2022-01-28 MED ORDER — HEPARIN SOD (PORK) LOCK FLUSH 100 UNIT/ML IV SOLN
INTRAVENOUS | Status: AC
  Administered 2022-01-28: 500 [IU]
  Filled 2022-01-28: qty 5

## 2022-02-02 DIAGNOSIS — R279 Unspecified lack of coordination: Secondary | ICD-10-CM | POA: Diagnosis not present

## 2022-02-02 DIAGNOSIS — R531 Weakness: Secondary | ICD-10-CM | POA: Diagnosis not present

## 2022-02-02 DIAGNOSIS — R296 Repeated falls: Secondary | ICD-10-CM | POA: Diagnosis not present

## 2022-02-03 ENCOUNTER — Telehealth: Payer: Self-pay | Admitting: Oncology

## 2022-02-03 NOTE — Telephone Encounter (Signed)
Called patient regarding upcoming appointments, patient is notified. 

## 2022-02-04 ENCOUNTER — Other Ambulatory Visit: Payer: Self-pay

## 2022-02-04 ENCOUNTER — Inpatient Hospital Stay: Payer: Medicare PPO | Admitting: Oncology

## 2022-02-04 VITALS — BP 146/58 | HR 92 | Temp 97.6°F | Resp 17 | Ht 65.0 in | Wt 156.6 lb

## 2022-02-04 DIAGNOSIS — D899 Disorder involving the immune mechanism, unspecified: Secondary | ICD-10-CM | POA: Diagnosis not present

## 2022-02-04 DIAGNOSIS — Z959 Presence of cardiac and vascular implant and graft, unspecified: Secondary | ICD-10-CM | POA: Diagnosis not present

## 2022-02-04 DIAGNOSIS — R918 Other nonspecific abnormal finding of lung field: Secondary | ICD-10-CM | POA: Diagnosis not present

## 2022-02-04 DIAGNOSIS — R590 Localized enlarged lymph nodes: Secondary | ICD-10-CM

## 2022-02-04 DIAGNOSIS — Z79899 Other long term (current) drug therapy: Secondary | ICD-10-CM | POA: Diagnosis not present

## 2022-02-04 DIAGNOSIS — C669 Malignant neoplasm of unspecified ureter: Secondary | ICD-10-CM | POA: Diagnosis not present

## 2022-02-04 DIAGNOSIS — Z95828 Presence of other vascular implants and grafts: Secondary | ICD-10-CM

## 2022-02-04 DIAGNOSIS — C641 Malignant neoplasm of right kidney, except renal pelvis: Secondary | ICD-10-CM

## 2022-02-04 DIAGNOSIS — E039 Hypothyroidism, unspecified: Secondary | ICD-10-CM | POA: Diagnosis not present

## 2022-02-04 DIAGNOSIS — C649 Malignant neoplasm of unspecified kidney, except renal pelvis: Secondary | ICD-10-CM | POA: Diagnosis not present

## 2022-02-04 MED ORDER — LEVOTHYROXINE SODIUM 50 MCG PO TABS: 50.0000 ug | ORAL_TABLET | Freq: Every day | ORAL | 1 refills | Status: DC

## 2022-02-04 NOTE — Progress Notes (Signed)
.Hematology and Oncology Follow Up  ? ?Kayla Bowers ?[id number] ?03-26-1929 88 y.o. ?02/04/2022 3:25 PM ?Kayla Bowers, MDHodges, Kayla Maize, MD  ? ? ? ? ?Principle Diagnosis: 88 year old woman with stage IV high-grade urothelial carcinoma of the ureter with pelvic adenopathy diagnosed in 2022.  She initially presented with T3N2 disease in 2021. ? ? ? ?Prior Therapy: ?  ?He is status post robotic assisted right nephroureterectomy and right adrenalectomy completed by Dr. Lovena Neighbours on May 21, 2020. ?  ?Adjuvant chemotherapy utilizing carboplatin and gemcitabine started on July 11, 2020.  She completed 4 cycles of therapy in December 2021. ? ?Nivolumab 480 mg every 4 weeks started on June 24, 2021.  She completed 7 cycles of therapy on December 24, 2021. ?  ?Current therapy: Active surveillance. ? ?Interim History: [name] is here for a follow-up visit.  Since the last visit, he reports feeling well without any major complaints.  Her diarrhea has resolved since the discontinuation of nivolumab.  She denies any nausea, vomiting or abdominal pain.  She denies any skin rashes or lesions.  She has reported weight gain and mild fatigue.  No other hospitalizations or illnesses. ? ? ? ? ?Medications: Reviewed without changes. ?Current Outpatient Medications  ?Medication Sig Dispense Refill  ? amLODipine (NORVASC) 5 MG tablet daily.    ? BioGaia Probiotic (BIOGAIA/GERBER SOOTHE) LIQD Take 5 drops by mouth daily at 8 pm.    ? fenofibrate 160 MG tablet Take 160 mg by mouth at bedtime.    ? gabapentin (NEURONTIN) 100 MG capsule 400 mg 3 (three) times daily.    ? hydrochlorothiazide (MICROZIDE) 12.5 MG capsule Take 12.5 mg by mouth 2 (two) times daily.    ? lidocaine-prilocaine (EMLA) cream Apply 1 application topically as needed. 30 g 0  ? Multiple Vitamins-Minerals (PRESERVISION AREDS PO) Take 1 capsule by mouth daily.    ? Multiple Vitamins-Minerals (PRESERVISION AREDS PO) Take by mouth daily.    ? prochlorperazine  (COMPAZINE) 10 MG tablet Take 1 tablet (10 mg total) by mouth every 6 (six) hours as needed for nausea or vomiting. 30 tablet 0  ? ?No current facility-administered medications for this visit.  ? ? ? ?Allergies:  ?Allergies  ?Allergen Reactions  ? Flomax [Tamsulosin Hcl] Nausea Only  ? ? ?Physical examination: ? ? ?Blood pressure (!) 146/58, pulse 92, temperature 97.6 ?F (36.4 ?C), temperature source Temporal, resp. rate 17, height '5\' 5"'$  (1.651 m), weight 156 lb 9.6 oz (71 kg), SpO2 100 %. ? ? ?ECOG 1 ? ? ?General appearance: Comfortable appearing without any discomfort ?Head: Normocephalic without any trauma ?Oropharynx: Mucous membranes are moist and pink without any thrush or ulcers. ?Eyes: Pupils are equal and round reactive to light. ?Lymph nodes: No cervical, supraclavicular, inguinal or axillary lymphadenopathy.   ?Heart:regular rate and rhythm.  S1 and S2 without leg edema. ?Lung: Clear without any rhonchi or wheezes.  No dullness to percussion. ?Abdomin: Soft, nontender, nondistended with good bowel sounds.  No hepatosplenomegaly. ?Musculoskeletal: No joint deformity or effusion.  Full range of motion noted. ?Neurological: No deficits noted on motor, sensory and deep tendon reflex exam. ?Dermatological: No skin rashes or lesions.   ? ? ? ? ? ? ? ? ? ? ?Lab Results: ?Lab Results  ?Component Value Date  ? WBC 6.5 01/28/2022  ? HGB 13.2 01/28/2022  ? HCT 40.1 01/28/2022  ? MCV 86.8 01/28/2022  ? PLT 246 01/28/2022  ? ?  Chemistry   ?   ?Component Value Date/Time  ?  NA 135 01/28/2022 1023  ? K 4.4 01/28/2022 1023  ? CL 99 01/28/2022 1023  ? CO2 30 01/28/2022 1023  ? BUN 33 (H) 01/28/2022 1023  ? CREATININE 1.41 (H) 01/28/2022 1023  ?    ?Component Value Date/Time  ? CALCIUM 10.2 01/28/2022 1023  ? ALKPHOS 59 01/28/2022 1023  ? AST 29 01/28/2022 1023  ? ALT 21 01/28/2022 1023  ? BILITOT 0.4 01/28/2022 1023  ?  ? ? ?IMPRESSION: ?1. Reduced size of the left upper lobe nodule, currently 7 by 4 mm. ?Some of the  previously defined small subpleural nodules have ?resolved. There are other areas of mildly increased subpleural ?nodularity in the right lower lobe which merit surveillance. ?2. No pathologic adenopathy identified. ?3. Other imaging findings of potential clinical significance: Aortic ?Atherosclerosis (ICD10-I70.0). Coronary atherosclerosis. Mild to ?moderate cardiomegaly. Healing right proximal humeral fracture with ?continued visualization of much of the dominant fracture plane. ?Right nephroureterectomy. Small left adrenal adenoma. Multiple ?duodenal diverticula. Prominent stool throughout the colon favors ?constipation. Postoperative findings in the sigmoid colon. Lax ?anterior abdominal wall musculature. Levoconvex thoracolumbar ?scoliosis with rotary component. Degenerative hip arthropathy ?bilaterally. ? ? ?Impression and Plan: ? ?88 year old woman with: ?  ?1.   Stage IV high-grade urothelial carcinoma of the ureter with pelvic adenopathy documented in 2022. ? ?  ?CT scan obtained on Jan 28, 2022 was personally reviewed and showed continued positive response to therapy without any evidence of lymphadenopathy and the reduction in the size of the pulmonary nodule.  At this time, I have recommended to withhold any additional treatment given her overall response to therapy and toxicities associated with immunotherapy.  Restarting treatment to a different salvage therapy option could be initiated if she has progression of disease in the future.  We will update her staging scans in 3 months.  She is agreeable to continue with this plan. ? ?  ?  ?2.  IV access: Port-A-Cath continues to be in place without any issues. ?  ?  ?  ?3.  Hypothyroidism: Her TSH is elevated related to nivolumab.  I will start her on Synthroid supplements and monitor accordingly. ? ? ? ?4.  Autoimmune complications: She has not had any residual complications at this time.  Colitis, pneumonitis and hepatitis were reiterated. ? ?5.  Pulmonary  nodule: Improved since the last scan we will continue to monitor on subsequent ones. ?  ?  ?6.  Follow-up: She will return in 6 weeks for a follow-up visit. ? ?30  minutes were spent spent on this visit.  The time was dedicated to reviewing disease status, reviewing imaging studies and addressing complications related to cancer and cancer therapy. ? ?  ?  ? ?[name], MD 02/04/2022 3:25 PM ? ?

## 2022-02-17 ENCOUNTER — Telehealth: Payer: Self-pay | Admitting: *Deleted

## 2022-02-17 NOTE — Telephone Encounter (Signed)
Returned PC to patient, instructed her to continue taking synthroid 25 mcg (1/2 tablet) daily.  She verbalizes understanding.

## 2022-02-17 NOTE — Telephone Encounter (Signed)
-----   Message from Wyatt Portela, MD sent at 02/17/2022 10:06 AM EDT ----- Stay on 25 for now ----- Message ----- From: Rolene Course, RN Sent: 02/17/2022  10:00 AM EDT To: Wyatt Portela, MD  Ms Kayla Bowers called & said she has been very dizzy since starting her synthroid, it is extremely back when has her head down to read, states she almost passed out & was having trouble walking.  She called her pharmacist & he told her to cut the pills in half.  She has been taking the 25 mcg dose x two days & feels much better.  She wants to know should she continue the lower dose or should she try the 50 mcg tablets again?  Please advise.  Thanks, Kayla Bowers

## 2022-02-23 ENCOUNTER — Telehealth: Payer: Self-pay | Admitting: *Deleted

## 2022-02-23 DIAGNOSIS — E032 Hypothyroidism due to medicaments and other exogenous substances: Secondary | ICD-10-CM | POA: Diagnosis not present

## 2022-02-23 DIAGNOSIS — I959 Hypotension, unspecified: Secondary | ICD-10-CM | POA: Diagnosis not present

## 2022-02-23 DIAGNOSIS — Z6824 Body mass index (BMI) 24.0-24.9, adult: Secondary | ICD-10-CM | POA: Diagnosis not present

## 2022-02-23 NOTE — Telephone Encounter (Signed)
-----   Message from Wyatt Portela, MD sent at 02/23/2022  9:31 AM EDT ----- Regarding: RE: Synthroid issues It's not clear what is causing this. She needs to see her PCP ----- Message ----- From: Rolene Course, RN Sent: 02/23/2022   9:20 AM EDT To: Wyatt Portela, MD Subject: Synthroid issues                               A family member of Ms Guevarra called this morning, she cut her synthroid dose down to 25 mcg/day last week due to dizziness & weakness - evidently she is still having the same issue, very drowsy, dizziness, her legs collapse when she tries to walk & her arms collapse when she is eating.  Please advise.  Thanks, Bethena Roys

## 2022-02-23 NOTE — Telephone Encounter (Signed)
Patient's most recent TSH results requested to be faxed to [facility], attention [place].  Results faxed to [phone], fax confirmation received.

## 2022-02-23 NOTE — Telephone Encounter (Signed)
Returned PC to patient, spoke with her husband - informed Dr. Alen Blew recommends that the patient see her PCP regarding her symptoms.  He verbalizes understanding.

## 2022-02-24 DIAGNOSIS — I959 Hypotension, unspecified: Secondary | ICD-10-CM | POA: Diagnosis not present

## 2022-02-25 DIAGNOSIS — K529 Noninfective gastroenteritis and colitis, unspecified: Secondary | ICD-10-CM | POA: Diagnosis not present

## 2022-02-25 DIAGNOSIS — Z043 Encounter for examination and observation following other accident: Secondary | ICD-10-CM | POA: Diagnosis not present

## 2022-02-25 DIAGNOSIS — C679 Malignant neoplasm of bladder, unspecified: Secondary | ICD-10-CM | POA: Diagnosis not present

## 2022-02-25 DIAGNOSIS — R5381 Other malaise: Secondary | ICD-10-CM | POA: Diagnosis not present

## 2022-02-25 DIAGNOSIS — I959 Hypotension, unspecified: Secondary | ICD-10-CM | POA: Diagnosis not present

## 2022-02-25 DIAGNOSIS — E860 Dehydration: Secondary | ICD-10-CM | POA: Diagnosis not present

## 2022-02-25 DIAGNOSIS — K6389 Other specified diseases of intestine: Secondary | ICD-10-CM | POA: Diagnosis not present

## 2022-02-25 DIAGNOSIS — M4807 Spinal stenosis, lumbosacral region: Secondary | ICD-10-CM | POA: Diagnosis not present

## 2022-02-25 DIAGNOSIS — R42 Dizziness and giddiness: Secondary | ICD-10-CM | POA: Diagnosis not present

## 2022-02-25 DIAGNOSIS — M19022 Primary osteoarthritis, left elbow: Secondary | ICD-10-CM | POA: Diagnosis not present

## 2022-02-25 DIAGNOSIS — N179 Acute kidney failure, unspecified: Secondary | ICD-10-CM | POA: Diagnosis not present

## 2022-02-25 DIAGNOSIS — E039 Hypothyroidism, unspecified: Secondary | ICD-10-CM | POA: Diagnosis not present

## 2022-02-25 DIAGNOSIS — R531 Weakness: Secondary | ICD-10-CM | POA: Diagnosis not present

## 2022-02-25 DIAGNOSIS — R410 Disorientation, unspecified: Secondary | ICD-10-CM | POA: Diagnosis not present

## 2022-02-25 DIAGNOSIS — C799 Secondary malignant neoplasm of unspecified site: Secondary | ICD-10-CM | POA: Diagnosis not present

## 2022-02-25 DIAGNOSIS — R110 Nausea: Secondary | ICD-10-CM | POA: Diagnosis not present

## 2022-02-25 DIAGNOSIS — R5383 Other fatigue: Secondary | ICD-10-CM | POA: Diagnosis not present

## 2022-02-25 DIAGNOSIS — M160 Bilateral primary osteoarthritis of hip: Secondary | ICD-10-CM | POA: Diagnosis not present

## 2022-02-26 DIAGNOSIS — K529 Noninfective gastroenteritis and colitis, unspecified: Secondary | ICD-10-CM | POA: Diagnosis not present

## 2022-02-26 DIAGNOSIS — E860 Dehydration: Secondary | ICD-10-CM | POA: Diagnosis not present

## 2022-02-26 DIAGNOSIS — N179 Acute kidney failure, unspecified: Secondary | ICD-10-CM | POA: Diagnosis not present

## 2022-02-27 DIAGNOSIS — E860 Dehydration: Secondary | ICD-10-CM | POA: Diagnosis not present

## 2022-02-27 DIAGNOSIS — N179 Acute kidney failure, unspecified: Secondary | ICD-10-CM | POA: Diagnosis not present

## 2022-02-27 DIAGNOSIS — K529 Noninfective gastroenteritis and colitis, unspecified: Secondary | ICD-10-CM | POA: Diagnosis not present

## 2022-03-04 DIAGNOSIS — R609 Edema, unspecified: Secondary | ICD-10-CM | POA: Diagnosis not present

## 2022-03-04 DIAGNOSIS — E039 Hypothyroidism, unspecified: Secondary | ICD-10-CM | POA: Diagnosis not present

## 2022-03-04 DIAGNOSIS — K529 Noninfective gastroenteritis and colitis, unspecified: Secondary | ICD-10-CM | POA: Diagnosis not present

## 2022-03-15 ENCOUNTER — Other Ambulatory Visit: Payer: Self-pay | Admitting: Physician Assistant

## 2022-03-15 ENCOUNTER — Inpatient Hospital Stay: Payer: Medicare PPO | Attending: Oncology

## 2022-03-15 ENCOUNTER — Inpatient Hospital Stay (HOSPITAL_BASED_OUTPATIENT_CLINIC_OR_DEPARTMENT_OTHER): Payer: Medicare PPO | Admitting: Physician Assistant

## 2022-03-15 ENCOUNTER — Telehealth: Payer: Self-pay

## 2022-03-15 ENCOUNTER — Other Ambulatory Visit (HOSPITAL_COMMUNITY): Payer: Self-pay

## 2022-03-15 ENCOUNTER — Other Ambulatory Visit (HOSPITAL_COMMUNITY): Payer: Self-pay | Admitting: Radiology

## 2022-03-15 ENCOUNTER — Other Ambulatory Visit: Payer: Self-pay

## 2022-03-15 ENCOUNTER — Ambulatory Visit (HOSPITAL_COMMUNITY)
Admission: RE | Admit: 2022-03-15 | Discharge: 2022-03-15 | Disposition: A | Discharge status: Home / Self Care | Payer: Medicare PPO | Source: Ambulatory Visit | Attending: Physician Assistant | Admitting: Physician Assistant

## 2022-03-15 ENCOUNTER — Encounter: Payer: Self-pay | Admitting: Oncology

## 2022-03-15 VITALS — BP 131/68 | HR 55 | Temp 97.8°F | Resp 16 | Wt 154.8 lb

## 2022-03-15 DIAGNOSIS — C669 Malignant neoplasm of unspecified ureter: Secondary | ICD-10-CM

## 2022-03-15 DIAGNOSIS — T80212A Local infection due to central venous catheter, initial encounter: Secondary | ICD-10-CM

## 2022-03-15 DIAGNOSIS — Z8551 Personal history of malignant neoplasm of bladder: Secondary | ICD-10-CM | POA: Diagnosis not present

## 2022-03-15 DIAGNOSIS — Y848 Other medical procedures as the cause of abnormal reaction of the patient, or of later complication, without mention of misadventure at the time of the procedure: Secondary | ICD-10-CM | POA: Insufficient documentation

## 2022-03-15 DIAGNOSIS — Z85528 Personal history of other malignant neoplasm of kidney: Secondary | ICD-10-CM | POA: Diagnosis not present

## 2022-03-15 DIAGNOSIS — C641 Malignant neoplasm of right kidney, except renal pelvis: Secondary | ICD-10-CM

## 2022-03-15 DIAGNOSIS — Y828 Other medical devices associated with adverse incidents: Secondary | ICD-10-CM | POA: Insufficient documentation

## 2022-03-15 DIAGNOSIS — T827XXA Infection and inflammatory reaction due to other cardiac and vascular devices, implants and grafts, initial encounter: Secondary | ICD-10-CM | POA: Diagnosis not present

## 2022-03-15 DIAGNOSIS — Z9221 Personal history of antineoplastic chemotherapy: Secondary | ICD-10-CM | POA: Diagnosis not present

## 2022-03-15 DIAGNOSIS — Z79899 Other long term (current) drug therapy: Secondary | ICD-10-CM | POA: Diagnosis not present

## 2022-03-15 DIAGNOSIS — Z87891 Personal history of nicotine dependence: Secondary | ICD-10-CM | POA: Diagnosis not present

## 2022-03-15 DIAGNOSIS — Z452 Encounter for adjustment and management of vascular access device: Secondary | ICD-10-CM | POA: Insufficient documentation

## 2022-03-15 DIAGNOSIS — C649 Malignant neoplasm of unspecified kidney, except renal pelvis: Secondary | ICD-10-CM

## 2022-03-15 HISTORY — PX: IR REMOVAL TUN ACCESS W/ PORT W/O FL MOD SED: IMG2290

## 2022-03-15 LAB — CMP (CANCER CENTER ONLY)
ALT: 9 U/L (ref 0–44)
AST: 15 U/L (ref 15–41)
Albumin: 3.7 g/dL (ref 3.5–5.0)
Alkaline Phosphatase: 62 U/L (ref 38–126)
Anion gap: 7 (ref 5–15)
BUN: 21 mg/dL (ref 8–23)
CO2: 30 mmol/L (ref 22–32)
Calcium: 10.2 mg/dL (ref 8.9–10.3)
Chloride: 96 mmol/L — ABNORMAL LOW (ref 98–111)
Creatinine: 1.15 mg/dL — ABNORMAL HIGH (ref 0.44–1.00)
GFR, Estimated: 45 mL/min — ABNORMAL LOW (ref >60)
Glucose, Bld: 129 mg/dL — ABNORMAL HIGH (ref 70–99)
Potassium: 3.9 mmol/L (ref 3.5–5.1)
Sodium: 133 mmol/L — ABNORMAL LOW (ref 135–145)
Total Bilirubin: 0.4 mg/dL (ref 0.3–1.2)
Total Protein: 6.4 g/dL — ABNORMAL LOW (ref 6.5–8.1)

## 2022-03-15 LAB — CBC WITH DIFFERENTIAL (CANCER CENTER ONLY)
Abs Immature Granulocytes: 0.06 10*3/uL (ref 0.00–0.07)
Basophils Absolute: 0.1 10*3/uL (ref 0.0–0.1)
Basophils Relative: 1 %
Eosinophils Absolute: 0.1 10*3/uL (ref 0.0–0.5)
Eosinophils Relative: 1 %
HCT: 36 % (ref 36.0–46.0)
Hemoglobin: 11.9 g/dL — ABNORMAL LOW (ref 12.0–15.0)
Immature Granulocytes: 1 %
Lymphocytes Relative: 18 %
Lymphs Abs: 1.5 10*3/uL (ref 0.7–4.0)
MCH: 29.2 pg (ref 26.0–34.0)
MCHC: 33.1 g/dL (ref 30.0–36.0)
MCV: 88.2 fL (ref 80.0–100.0)
Monocytes Absolute: 0.7 10*3/uL (ref 0.1–1.0)
Monocytes Relative: 8 %
Neutro Abs: 6.1 10*3/uL (ref 1.7–7.7)
Neutrophils Relative %: 71 %
Platelet Count: 335 10*3/uL (ref 150–400)
RBC: 4.08 MIL/uL (ref 3.87–5.11)
RDW: 17.2 % — ABNORMAL HIGH (ref 11.5–15.5)
WBC Count: 8.6 10*3/uL (ref 4.0–10.5)
nRBC: 0 % (ref 0.0–0.2)

## 2022-03-15 LAB — TSH: TSH: 97.776 u[IU]/mL — ABNORMAL HIGH (ref 0.350–4.500)

## 2022-03-15 MED ORDER — LIDOCAINE-EPINEPHRINE 1 %-1:100000 IJ SOLN
INTRAMUSCULAR | Status: AC
  Filled 2022-03-15: qty 1

## 2022-03-15 MED ORDER — DOXYCYCLINE HYCLATE 100 MG PO TABS: 100.0000 mg | ORAL_TABLET | Freq: Two times a day (BID) | ORAL | 0 refills | Status: DC

## 2022-03-15 MED ORDER — LIDOCAINE-EPINEPHRINE 1 %-1:100000 IJ SOLN
INTRAMUSCULAR | Status: DC | PRN
  Administered 2022-03-15: 10 mL

## 2022-03-15 MED ORDER — DOXYCYCLINE HYCLATE 100 MG PO TABS
100.0000 mg | ORAL_TABLET | Freq: Two times a day (BID) | ORAL | 0 refills | Status: AC
  Filled 2022-03-15 (×2): qty 14, 7d supply, fill #0

## 2022-03-15 MED ORDER — LIDOCAINE HCL 1 % IJ SOLN
INTRAMUSCULAR | Status: DC
Start: 2022-03-15 — End: 2022-03-15
  Filled 2022-03-15: qty 20

## 2022-03-15 NOTE — Progress Notes (Signed)
Chief Complaint: Concern for portacath infection. Request is for portacath removal  Referring Physician(s): Sherol Dade E  Supervising Physician: Markus Daft  Patient Status: [facility] - Out-pt  History of Present Illness: Kayla Bowers is a 88 y.o. female outpatient. History of renal cell carcinoma a./p portacath placement on 10.11.21. Patient seen at oncology clinic today regarding an infected port site. Patient was placed on doxy by oncology. Patient presents for portacath evaluation.    Past Medical History:  Diagnosis Date   Bladder cancer ([place])    Blood clot in vein 40 yrs ago   left   Bundle branch block    left   Cancer of kidney ([place])    right kidney 1  area cancer last chemo Sep 18 2020   Closed wedge compression fracture of T2 vertebra ([place]) 07/09/2019   History of kidney stones    Hypercholesteremia    Hypertension    Kidney stone    Leg edema, left    some swelling goes down after sleeping saew pcp and gabapentin increased   Neuropathy    toes and x 2 in hands   Osteoporosis    Skin abnormality    sore quarter size x 3 weeks as of 11-03-2020 healing open to air with rare bloody drainage   Varicose veins of both lower extremities    Wears glasses    Wears partial dentures    upper and lower    Past Surgical History:  Procedure Laterality Date   ABDOMINAL HYSTERECTOMY  40 yrs ago   complete   APPENDECTOMY  40 yrs ago   done with hysterectomy   BLADDER SURGERY  01/2017   removal of a tumor   CATARACT EXTRACTION     COLON SURGERY  18 yrs ago   took out 12 inches   COLONOSCOPY  11/19/2009   Mild pancolonic diverticulitis. Status post sigmoid resection. Small internal hemorrhoid. Otherwies normal colonsocopy.    CYSTOSCOPY WITH BIOPSY N/A 11/05/2020   Procedure: CYSTOSCOPY WITH BLADDER BIOPSY/ FULGURATION;  Surgeon: Ceasar Mons, MD;  Location: [facility];  Service: Urology;  Laterality: N/A;   CYSTOSCOPY WITH  URETEROSCOPY AND STENT PLACEMENT Right 01/30/2020   Procedure: CYSTOSCOPY WITH RIGHT URETEROSCOPY/BIOPSY;  Surgeon: Ceasar Mons, MD;  Location: [facility];  Service: Urology;  Laterality: Right;   EYE SURGERY Bilateral 1990   Implant   IR IMAGING GUIDED PORT INSERTION  07/07/2020   ROBOT ASSITED LAPAROSCOPIC NEPHROURETERECTOMY Right 05/21/2020   Procedure: XI ROBOT ASSITED LAPAROSCOPIC NEPHROURETERECTOMY,ADRENALECTOMY;  Surgeon: Ceasar Mons, MD;  Location: [facility];  Service: Urology;  Laterality: Right;   Tummy Tuck  yrs ago   Vericose Vein Stripping  yrs ago   left leg    Allergies: Flomax [tamsulosin hcl]  Medications: Prior to Admission medications   Medication Sig Start Date End Date Taking? Authorizing Provider  amLODipine (NORVASC) 5 MG tablet daily. 08/18/20   [provider]  BioGaia Probiotic (BIOGAIA/GERBER SOOTHE) LIQD Take 5 drops by mouth daily at 8 pm.    [provider]  doxycycline (VIBRA-TABS) 100 MG tablet Take 1 tablet (100 mg total) by mouth 2 (two) times daily for 7 days. 03/15/22 03/22/22  Jacqualine Mau, NP  fenofibrate 160 MG tablet Take 160 mg by mouth at bedtime. 03/06/20   [provider]  gabapentin (NEURONTIN) 100 MG capsule 400 mg 3 (three) times daily. 08/15/20   [provider]  hydrochlorothiazide (MICROZIDE) 12.5 MG capsule Take 12.5  mg by mouth 2 (two) times daily.    [provider]  levothyroxine (SYNTHROID) 50 MCG tablet Take 1 tablet (50 mcg total) by mouth daily before breakfast. 02/04/22   Wyatt Portela, MD  lidocaine-prilocaine (EMLA) cream Apply 1 application topically as needed. 06/27/20   Wyatt Portela, MD  Multiple Vitamins-Minerals (PRESERVISION AREDS PO) Take 1 capsule by mouth daily.    [provider]  Multiple Vitamins-Minerals (PRESERVISION AREDS PO) Take by mouth daily.    [provider]  prochlorperazine (COMPAZINE) 10 MG  tablet Take 1 tablet (10 mg total) by mouth every 6 (six) hours as needed for nausea or vomiting. 06/15/21   Wyatt Portela, MD     Family History  Problem Relation Age of Onset   Colon cancer Neg Hx    Esophageal cancer Neg Hx     Social History   Socioeconomic History   Marital status: Married    Spouse name: Not on file   Number of children: 2   Years of education: Not on file   Highest education level: Not on file  Occupational History   Not on file  Tobacco Use   Smoking status: Former    Packs/day: 1.00    Years: 40.00    Total pack years: 40.00    Types: Cigarettes   Smokeless tobacco: Never   Tobacco comments:    quit age 10  Vaping Use   Vaping Use: Never used  Substance and Sexual Activity   Alcohol use: Not Currently    Comment: quit 2 years ago    Drug use: Never   Sexual activity: Not Currently    Birth control/protection: Post-menopausal  Other Topics Concern   Not on file  Social History Narrative   Not on file   Social Determinants of Health   Financial Resource Strain: Not on file  Food Insecurity: Not on file  Transportation Needs: Not on file  Physical Activity: Not on file  Stress: Not on file  Social Connections: Not on file    Review of Systems: A 12 point ROS discussed and pertinent positives are indicated in the HPI above.  All other systems are negative.  Review of Systems  Constitutional:  Negative for fatigue and fever.  HENT:  Negative for congestion.   Respiratory:  Negative for cough and shortness of breath.   Gastrointestinal:  Negative for abdominal pain, diarrhea, nausea and vomiting.  Skin:  Positive for wound (discharge at port site described as "cottage cheese").    Vital Signs: There were no vitals taken for this visit.  Physical Exam Vitals and nursing note reviewed.  Constitutional:      Appearance: She is well-developed.  HENT:     Head: Normocephalic and atraumatic.  Eyes:     Conjunctiva/sclera:  Conjunctivae normal.  Pulmonary:     Effort: Pulmonary effort is normal.  Musculoskeletal:        General: Normal range of motion.     Cervical back: Normal range of motion.  Skin:    General: Skin is warm.     Comments: Portacath site is warm to the touch with erythema and weeping noted.  Neurological:     Mental Status: She is alert and oriented to person, place, and time.     Imaging: No results found.  Labs:  CBC: Recent Labs    11/26/21 0845 12/24/21 1154 01/28/22 1023 03/15/22 1336  WBC 5.1 4.5 6.5 8.6  HGB 12.5 12.4 13.2 11.9*  HCT 38.6 39.0 40.1 36.0  PLT 226 245 246 335    COAGS: No results for input(s): "INR", "APTT" in the last 8760 hours.  BMP: Recent Labs    11/26/21 0845 12/24/21 1154 01/28/22 1023 03/15/22 1336  NA 133* 141 135 133*  K 4.0 4.4 4.4 3.9  CL 98 104 99 96*  CO2 29 32 30 30  GLUCOSE 90 87 107* 129*  BUN 23 26* 33* 21  CALCIUM 10.4* 9.9 10.2 10.2  CREATININE 1.06* 1.20* 1.41* 1.15*  GFRNONAA 50* 43* 35* 45*    LIVER FUNCTION TESTS: Recent Labs    11/26/21 0845 12/24/21 1154 01/28/22 1023 03/15/22 1336  BILITOT 0.5 0.4 0.4 0.4  AST '23 26 29 15  '$ ALT '14 18 21 9  '$ ALKPHOS 72 59 59 62  PROT 6.9 6.4* 6.8 6.4*  ALBUMIN 4.1 3.9 4.2 3.7     Assessment and Plan:  88 y.o. female outpatient. History of renal cell carcinoma a./p portacath placement on 10.11.21. Patient seen at oncology clinic today regarding an infected port site. Patient was placed on doxy by oncology. Patient presents for portacath evaluation.    Patient and husband at bedside states that there has been drainage that they decribed as "cottage cheese" that started 3 days ago. Site is warm to the touch with erythema and weeping noted. To the inferior aspect  Patient denies any fevers. Patient has completed treatment and the port is no longer needed.   Case discussed with IR Attending Dr. Anselm Pancoast. Plan for portacath to be removed Port site was able to be expressed.  Hydrogel instilled in port pocket.  Patient to return on 6.21.23 and 6.24.23 for site checks.  Patient and husband verbalized understanding and are in agreement with the plan of care.  Risks and benefits of image guided port-a-catheter removal was discussed with the patient including, but not limited to bleeding, infection, pneumothorax, or fibrin sheath development and need for additional procedures.  All of the patient's questions were answered, patient is agreeable to proceed. Consent signed and in chart.   Thank you for this interesting consult.  I greatly enjoyed meeting Kayla Bowers and look forward to participating in their care.  A copy of this report was sent to the requesting provider on this date.  Electronically Signed: Jacqualine Mau, NP 03/15/2022, 4:25 PM   I spent a total of  30 Minutes   in face to face in clinical consultation, greater than 50% of which was counseling/coordinating care for portacath removal

## 2022-03-15 NOTE — Procedures (Signed)
Interventional Radiology Procedure:   Indications: Port infection  Procedure: Port removal  Findings: Right chest port site was red and weeping fluid.  Port was completely removed.  Pocket instilled with hydrogel.  See full report in IMAGING.   Complications: No immediate complications noted.     EBL: Minimal  Plan: Wound follow up on 03/17/22   Kerryn Tennant R. Anselm Pancoast, MD  Pager: [phone]

## 2022-03-15 NOTE — Progress Notes (Signed)
Symptom Management Consult note [place]    Patient Care Team: [name], MD as PCP - General (Family Medicine) Berniece Salines, DO as PCP - Cardiology (Cardiology)    Name of the patient: Kayla Bowers  [id number]  02-24-1929   Date of visit: 03/15/2022    Chief complaint/ Reason for visit- port infection  Oncology History  Malignant neoplasm of kidney excluding renal pelvis ([place])  06/27/2020 Initial Diagnosis   Malignant neoplasm of kidney excluding renal pelvis ([place])   07/11/2020 - 09/18/2020 Chemotherapy         06/04/2021 Cancer Staging   Staging form: Kidney, AJCC 8th Edition - Clinical: Stage IV (cT3, cN1, cM1) - Signed by Wyatt Portela, MD on 06/04/2021   06/16/2021 -  Chemotherapy   Patient is on Treatment Plan : LUNG Nivolumab q28d       Current Therapy: active surveillance  Interval history- Kayla Bowers is a 88 y.o. with oncologic history as above presenting to [facility] today with chief complaint of port infection x 4 days.  Patient states she was recently in [facility] for "infection in her intestines" and was sent home with 2 oral antibiotics.  She does not remember exactly what infection she had.  She does not remember which antibiotics she is currently taking either.  Patient states she first noticed that there was some redness around the port.  She states the area of redness was getting larger each day.  There looked to be a pocket of pus that drained last night while she was sleeping.  She reports the area is very sore to the touch.  She describes the pain as a sharpness.  Pain is 10 out of 10 with palpation.  No over-the-counter medications taken today prior to arrival.  She denies any fevers, chills, shortness of breath, urinary symptoms, diarrhea.  She denies any injury to the port site.  Chart review shows patient had port placed 07/07/2020 with interventional radiology team.  She is accompanied today by her spouse who provides  additional history.      ROS  All other systems are reviewed and are negative for acute change except as noted in the HPI.    Allergies  Allergen Reactions   Flomax [Tamsulosin Hcl] Nausea Only     Past Medical History:  Diagnosis Date   Bladder cancer ([place])    Blood clot in vein 40 yrs ago   left   Bundle branch block    left   Cancer of kidney ([place])    right kidney 1  area cancer last chemo Sep 18 2020   Closed wedge compression fracture of T2 vertebra ([place]) 07/09/2019   History of kidney stones    Hypercholesteremia    Hypertension    Kidney stone    Leg edema, left    some swelling goes down after sleeping saew pcp and gabapentin increased   Neuropathy    toes and x 2 in hands   Osteoporosis    Skin abnormality    sore quarter size x 3 weeks as of 11-03-2020 healing open to air with rare bloody drainage   Varicose veins of both lower extremities    Wears glasses    Wears partial dentures    upper and lower     Past Surgical History:  Procedure Laterality Date   ABDOMINAL HYSTERECTOMY  40 yrs ago   complete   APPENDECTOMY  40 yrs ago   done with hysterectomy  BLADDER SURGERY  01/2017   removal of a tumor   CATARACT EXTRACTION     COLON SURGERY  18 yrs ago   took out 12 inches   COLONOSCOPY  11/19/2009   Mild pancolonic diverticulitis. Status post sigmoid resection. Small internal hemorrhoid. Otherwies normal colonsocopy.    CYSTOSCOPY WITH BIOPSY N/A 11/05/2020   Procedure: CYSTOSCOPY WITH BLADDER BIOPSY/ FULGURATION;  Surgeon: Ceasar Mons, MD;  Location: [facility];  Service: Urology;  Laterality: N/A;   CYSTOSCOPY WITH URETEROSCOPY AND STENT PLACEMENT Right 01/30/2020   Procedure: CYSTOSCOPY WITH RIGHT URETEROSCOPY/BIOPSY;  Surgeon: Ceasar Mons, MD;  Location: [facility];  Service: Urology;  Laterality: Right;   EYE SURGERY Bilateral 1990   Implant   IR IMAGING GUIDED PORT INSERTION  07/07/2020    ROBOT ASSITED LAPAROSCOPIC NEPHROURETERECTOMY Right 05/21/2020   Procedure: XI ROBOT ASSITED LAPAROSCOPIC NEPHROURETERECTOMY,ADRENALECTOMY;  Surgeon: Ceasar Mons, MD;  Location: [facility];  Service: Urology;  Laterality: Right;   Tummy Tuck  yrs ago   Vericose Vein Stripping  yrs ago   left leg    Social History   Socioeconomic History   Marital status: Married    Spouse name: Not on file   Number of children: 2   Years of education: Not on file   Highest education level: Not on file  Occupational History   Not on file  Tobacco Use   Smoking status: Former    Packs/day: 1.00    Years: 40.00    Total pack years: 40.00    Types: Cigarettes   Smokeless tobacco: Never   Tobacco comments:    quit age 63  Vaping Use   Vaping Use: Never used  Substance and Sexual Activity   Alcohol use: Not Currently    Comment: quit 2 years ago    Drug use: Never   Sexual activity: Not Currently    Birth control/protection: Post-menopausal  Other Topics Concern   Not on file  Social History Narrative   Not on file   Social Determinants of Health   Financial Resource Strain: Not on file  Food Insecurity: Not on file  Transportation Needs: Not on file  Physical Activity: Not on file  Stress: Not on file  Social Connections: Not on file  Intimate Partner Violence: Not on file    Family History  Problem Relation Age of Onset   Colon cancer Neg Hx    Esophageal cancer Neg Hx      Current Outpatient Medications:    amLODipine (NORVASC) 5 MG tablet, daily., Disp: , Rfl:    BioGaia Probiotic (BIOGAIA/GERBER SOOTHE) LIQD, Take 5 drops by mouth daily at 8 pm., Disp: , Rfl:    doxycycline (VIBRA-TABS) 100 MG tablet, Take 1 tablet (100 mg total) by mouth 2 (two) times daily for 7 days., Disp: 14 tablet, Rfl: 0   fenofibrate 160 MG tablet, Take 160 mg by mouth at bedtime., Disp: , Rfl:    gabapentin (NEURONTIN) 100 MG capsule, 400 mg 3 (three) times daily., Disp: , Rfl:     hydrochlorothiazide (MICROZIDE) 12.5 MG capsule, Take 12.5 mg by mouth 2 (two) times daily., Disp: , Rfl:    levothyroxine (SYNTHROID) 50 MCG tablet, Take 1 tablet (50 mcg total) by mouth daily before breakfast., Disp: 60 tablet, Rfl: 1   lidocaine-prilocaine (EMLA) cream, Apply 1 application topically as needed., Disp: 30 g, Rfl: 0   Multiple Vitamins-Minerals (PRESERVISION AREDS PO), Take 1 capsule by mouth daily., Disp: ,  Rfl:    Multiple Vitamins-Minerals (PRESERVISION AREDS PO), Take by mouth daily., Disp: , Rfl:    prochlorperazine (COMPAZINE) 10 MG tablet, Take 1 tablet (10 mg total) by mouth every 6 (six) hours as needed for nausea or vomiting., Disp: 30 tablet, Rfl: 0 No current facility-administered medications for this visit.  Facility-Administered Medications Ordered in Other Visits:    lidocaine (XYLOCAINE) 1 % (with pres) injection, , , ,    lidocaine-EPINEPHrine (XYLOCAINE W/EPI) 1 %-1:100000 (with pres) injection, , , ,   PHYSICAL EXAM: ECOG FS:1 - Symptomatic but completely ambulatory    Vitals:   03/15/22 1401  BP: 131/68  Pulse: (!) 55  Resp: 16  Temp: 97.8 F (36.6 C)  TempSrc: Oral  SpO2: 99%  Weight: 154 lb 12.8 oz (70.2 kg)   Physical Exam Vitals and nursing note reviewed.  Constitutional:      Appearance: She is well-developed. She is not ill-appearing or toxic-appearing.  HENT:     Head: Normocephalic and atraumatic.     Nose: Nose normal.  Eyes:     General: No scleral icterus.       Right eye: No discharge.        Left eye: No discharge.     Conjunctiva/sclera: Conjunctivae normal.  Neck:     Vascular: No JVD.  Cardiovascular:     Rate and Rhythm: Normal rate and regular rhythm.     Pulses: Normal pulses.     Heart sounds: Normal heart sounds.  Pulmonary:     Effort: Pulmonary effort is normal.     Breath sounds: Normal breath sounds.  Chest:     Comments: Port in right upper chest with 9 x 5 cm area of overlying erythema extending to her  right breast.  Tender to the touch and warm.  No purulent drainage.  Please see media below. Abdominal:     General: There is no distension.  Musculoskeletal:        General: Normal range of motion.     Cervical back: Normal range of motion.  Skin:    General: Skin is warm and dry.  Neurological:     Mental Status: She is oriented to person, place, and time.     GCS: GCS eye subscore is 4. GCS verbal subscore is 5. GCS motor subscore is 6.     Comments: Fluent speech, no facial droop.  Psychiatric:        Behavior: Behavior normal.         LABORATORY DATA: I have reviewed the data as listed    Latest Ref Rng & Units 03/15/2022    1:36 PM 01/28/2022   10:23 AM 12/24/2021   11:54 AM  CBC  WBC 4.0 - 10.5 K/uL 8.6  6.5  4.5   Hemoglobin 12.0 - 15.0 g/dL 11.9  13.2  12.4   Hematocrit 36.0 - 46.0 % 36.0  40.1  39.0   Platelets 150 - 400 K/uL 335  246  245         Latest Ref Rng & Units 03/15/2022    1:36 PM 01/28/2022   10:23 AM 12/24/2021   11:54 AM  CMP  Glucose 70 - 99 mg/dL 129  107  87   BUN 8 - 23 mg/dL 21  33  26   Creatinine 0.44 - 1.00 mg/dL 1.15  1.41  1.20   Sodium 135 - 145 mmol/L 133  135  141   Potassium 3.5 - 5.1 mmol/L 3.9  4.4  4.4   Chloride 98 - 111 mmol/L 96  99  104   CO2 22 - 32 mmol/L 30  30  32   Calcium 8.9 - 10.3 mg/dL 10.2  10.2  9.9   Total Protein 6.5 - 8.1 g/dL 6.4  6.8  6.4   Total Bilirubin 0.3 - 1.2 mg/dL 0.4  0.4  0.4   Alkaline Phos 38 - 126 U/L 62  59  59   AST 15 - 41 U/L '15  29  26   '$ ALT 0 - 44 U/L '9  21  18        '$ RADIOGRAPHIC STUDIES: I have personally reviewed the radiological images as listed and agreed with the findings in the report. No images are attached to the encounter. No results found.   ASSESSMENT & PLAN: Patient is a 88 y.o. 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 Alen Blew.  I have viewed most recent oncology note and lab work.    #)Port infection-patient is nontoxic-appearing.  She is afebrile and hemodynamically stable.  She has obvious infection to her port on physical exam.  CBC today without leukocytosis.  CMP shows mild hyponatremia, creatinine seems close to baseline based on previous labs.  Patient will need IR evaluation for the port and likely removal.  I was able to contact IR department today and send patient for evaluation.  It is unlikely she will have port removed today so we will go ahead and send prescription to doxycycline to her pharmacy and encouraged her to start taking it today..  Patient reports being on 2 antibiotics for an infection in her intestines.  I am unable to see notes from recent hospital admission as it is not under care everywhere and is outside our sytem.  It sounds like patient is describing possible diverticulitis and that could be treated with Levaquin and flagyl based on the description of the antibiotics. Needing MRSA coverage doxycycline was felt to be the best antibiotic choice for the port infection. I will send message to her oncologist to make him aware of port removal. Appreciate IR assisting with this patient.  Strict ED precautions discussed should symptoms worsen.   Visit Diagnosis: 1. Port or reservoir infection, initial encounter      No orders of the defined types were placed in this encounter.   All questions were answered. The patient knows to call the clinic with any problems, questions or concerns. No barriers to learning was detected.  I have spent a total of 30 minutes minutes of face-to-face and non-face-to-face time, preparing to see the patient, obtaining and/or reviewing separately obtained history, performing a medically appropriate examination, counseling and educating the patient, ordering tests,  documenting clinical information in the electronic health record, and care coordination.     Thank you for allowing me to participate in the care of this patient.     Barrie Folk, PA-C Department of Hematology/Oncology [facility] at [facility] Phone: [phone]  [phone]    03/15/2022 4:27 PM

## 2022-03-15 NOTE — Telephone Encounter (Signed)
T/C from pt stating her port looks infected.  It is red, white discharge and sore.  Message to [facility] and pt will be seen today at 1:30

## 2022-03-17 ENCOUNTER — Other Ambulatory Visit (HOSPITAL_COMMUNITY): Payer: Self-pay | Admitting: Radiology

## 2022-03-17 ENCOUNTER — Ambulatory Visit (HOSPITAL_COMMUNITY)
Admission: RE | Admit: 2022-03-17 | Discharge: 2022-03-17 | Disposition: A | Discharge status: Home / Self Care | Payer: Medicare PPO | Source: Ambulatory Visit | Attending: Radiology | Admitting: Radiology

## 2022-03-17 ENCOUNTER — Encounter (HOSPITAL_COMMUNITY): Payer: Self-pay

## 2022-03-17 DIAGNOSIS — C649 Malignant neoplasm of unspecified kidney, except renal pelvis: Secondary | ICD-10-CM

## 2022-03-17 HISTORY — PX: IR PATIENT EVAL TECH 0-60 MINS: IMG5564

## 2022-03-17 NOTE — Progress Notes (Signed)
Patient presents to IR for site check after port was removed for infection 03/15/22 with Dr. Anselm Pancoast. Site was noted to be weeping small amount serosanguineous fluid with circumferential redness. Pt and husband reports site looks better that prior visit.Pt states that she is still taking her doxycycline.  Port site was cleaned and instilled with hydrogel.  Telfa was applied and covered with Tegaderm.  Pt was provided with f/u appointment 03/22/22.   Images added in chart.        Narda Rutherford, AGNP-BC 03/17/2022, 4:29 PM

## 2022-03-17 NOTE — Procedures (Signed)
Assisted PA with removal of bandage and replacement of new bandage after wound was cleaned by PA.

## 2022-03-22 ENCOUNTER — Encounter (HOSPITAL_COMMUNITY): Payer: Self-pay

## 2022-03-22 ENCOUNTER — Other Ambulatory Visit (HOSPITAL_COMMUNITY): Payer: Self-pay | Admitting: Radiology

## 2022-03-22 ENCOUNTER — Ambulatory Visit (HOSPITAL_COMMUNITY)
Admission: RE | Admit: 2022-03-22 | Discharge: 2022-03-22 | Disposition: A | Discharge status: Home / Self Care | Payer: Medicare PPO | Source: Ambulatory Visit | Attending: Radiology | Admitting: Radiology

## 2022-03-22 DIAGNOSIS — Z4800 Encounter for change or removal of nonsurgical wound dressing: Secondary | ICD-10-CM | POA: Diagnosis not present

## 2022-03-22 DIAGNOSIS — C649 Malignant neoplasm of unspecified kidney, except renal pelvis: Secondary | ICD-10-CM | POA: Diagnosis not present

## 2022-03-22 HISTORY — PX: IR PATIENT EVAL TECH 0-60 MINS: IMG5564

## 2022-03-22 NOTE — Progress Notes (Signed)
Patient presents to IR for site check after port was removed for infection 03/15/22 with Dr. Lowella Dandy. Area of circumferential redness has decreased.  Pt and husband reports site looks better that prior visit with less drainage to gauze at each dressing change.   Port site was cleaned and instilled with hydrogel.  Telfa was applied and covered with Tegaderm. Pt was provided with supplies for dressing changes.    Pt was provided with f/u appointment 03/29/22.    Alex Gardener, AGNP-BC 03/22/2022, 12:00 PM

## 2022-03-26 DIAGNOSIS — I959 Hypotension, unspecified: Secondary | ICD-10-CM | POA: Diagnosis not present

## 2022-03-27 DIAGNOSIS — I129 Hypertensive chronic kidney disease with stage 1 through stage 4 chronic kidney disease, or unspecified chronic kidney disease: Secondary | ICD-10-CM | POA: Diagnosis not present

## 2022-03-27 DIAGNOSIS — N183 Chronic kidney disease, stage 3 unspecified: Secondary | ICD-10-CM | POA: Diagnosis not present

## 2022-03-27 DIAGNOSIS — I959 Hypotension, unspecified: Secondary | ICD-10-CM | POA: Diagnosis not present

## 2022-03-29 ENCOUNTER — Ambulatory Visit (HOSPITAL_COMMUNITY)
Admission: RE | Admit: 2022-03-29 | Discharge: 2022-03-29 | Disposition: A | Discharge status: Home / Self Care | Payer: Medicare PPO | Source: Ambulatory Visit | Attending: Radiology | Admitting: Radiology

## 2022-03-29 ENCOUNTER — Encounter (HOSPITAL_COMMUNITY): Payer: Self-pay | Admitting: Radiology

## 2022-03-29 ENCOUNTER — Other Ambulatory Visit (HOSPITAL_COMMUNITY): Payer: Self-pay | Admitting: Radiology

## 2022-03-29 DIAGNOSIS — C649 Malignant neoplasm of unspecified kidney, except renal pelvis: Secondary | ICD-10-CM

## 2022-03-29 DIAGNOSIS — Z4800 Encounter for change or removal of nonsurgical wound dressing: Secondary | ICD-10-CM | POA: Insufficient documentation

## 2022-03-29 DIAGNOSIS — T80212A Local infection due to central venous catheter, initial encounter: Secondary | ICD-10-CM

## 2022-03-29 DIAGNOSIS — Z95828 Presence of other vascular implants and grafts: Secondary | ICD-10-CM

## 2022-03-29 HISTORY — PX: IR PATIENT EVAL TECH 0-60 MINS: IMG5564

## 2022-03-29 NOTE — Procedures (Signed)
Patient presented for hydrogel therapy post port infection. Pt denies any complaints from site and states she feels site is healing nicely. I brought the patient into the exam bay, had her change into a hospital gown, removed her telfa/tegaderm bandadge, and had Rowe Robert and Candiss Norse come into the room to wash out old hydrogel and reapply new hydrogel. The pt was sent home with multiple telfa and tegederm bandadges for routine changes during the upcoming week The patient will return for another follow-up visit for further evaluation next Monday, July 10th.

## 2022-03-29 NOTE — Progress Notes (Signed)
Patient presented for hydrogel therapy post port infection. Patient denies fevers, chills, pain, redness  or draiange from site. She just recently finished PO abx.  Wound appears to be healing well with approximately 6 cm open wound with beefy red tissue present, no purulent drainage or bleeding noted. Small amount of light yellow drainage on bandage. No erythema, edema or tenderness to palpation.  Wound washed with 10 cc NS, hydrogel replaced, dressing applied.  Patient to return to IR for repeat hydrogel treatment/wound assessment in about a 1 week.  Candiss Norse, PA-C

## 2022-04-05 ENCOUNTER — Encounter (HOSPITAL_COMMUNITY): Payer: Self-pay | Admitting: Radiology

## 2022-04-05 ENCOUNTER — Other Ambulatory Visit (HOSPITAL_COMMUNITY): Payer: Self-pay | Admitting: Physician Assistant

## 2022-04-05 ENCOUNTER — Ambulatory Visit (HOSPITAL_COMMUNITY)
Admission: RE | Admit: 2022-04-05 | Discharge: 2022-04-05 | Disposition: A | Discharge status: Home / Self Care | Payer: Medicare PPO | Source: Ambulatory Visit | Attending: Physician Assistant | Admitting: Physician Assistant

## 2022-04-05 DIAGNOSIS — T80212A Local infection due to central venous catheter, initial encounter: Secondary | ICD-10-CM

## 2022-04-05 HISTORY — PX: IR PATIENT EVAL TECH 0-60 MINS: IMG5564

## 2022-04-05 NOTE — Progress Notes (Signed)
Pt presented today for weekly port site check.  Upon exam, port site has closed with formed scab over previously open area. There is mild area of redness to skin around port site (see images under media).  Pt denies fever, chills, pain at site.  Bandage was applied.  Pt was provided with supplies.  She was advised that she does not need to return for follow up unless she experiences changes at site.  Pt and husband verbalized understanding.     Narda Rutherford, AGNP-BC 04/05/2022, 12:23 PM

## 2022-04-05 NOTE — Procedures (Signed)
Pt was seen briefly for follow-up port removal wound healing. I brought the patient into the exam bay, had her change into a hospital gown, removed her telfa/tegaderm bandadge and she was then seen by Narda Rutherford, NP. Images were saved to her chart, see her Note for further details. I standard large bandaid was applied to allow for more air flow; site appears closed thought not fully scabbed over.

## 2022-04-08 DIAGNOSIS — H9193 Unspecified hearing loss, bilateral: Secondary | ICD-10-CM | POA: Diagnosis not present

## 2022-04-08 DIAGNOSIS — N183 Chronic kidney disease, stage 3 unspecified: Secondary | ICD-10-CM | POA: Diagnosis not present

## 2022-04-08 DIAGNOSIS — I129 Hypertensive chronic kidney disease with stage 1 through stage 4 chronic kidney disease, or unspecified chronic kidney disease: Secondary | ICD-10-CM | POA: Diagnosis not present

## 2022-04-08 DIAGNOSIS — Z6825 Body mass index (BMI) 25.0-25.9, adult: Secondary | ICD-10-CM | POA: Diagnosis not present

## 2022-04-08 DIAGNOSIS — R609 Edema, unspecified: Secondary | ICD-10-CM | POA: Diagnosis not present

## 2022-04-14 ENCOUNTER — Inpatient Hospital Stay: Payer: Medicare PPO

## 2022-04-14 ENCOUNTER — Other Ambulatory Visit: Payer: Self-pay

## 2022-04-14 ENCOUNTER — Inpatient Hospital Stay: Payer: Medicare PPO | Attending: Oncology | Admitting: Oncology

## 2022-04-14 VITALS — BP 124/56 | HR 48 | Temp 97.9°F | Resp 17 | Ht 65.0 in | Wt 150.8 lb

## 2022-04-14 DIAGNOSIS — R54 Age-related physical debility: Secondary | ICD-10-CM | POA: Diagnosis not present

## 2022-04-14 DIAGNOSIS — E039 Hypothyroidism, unspecified: Secondary | ICD-10-CM | POA: Insufficient documentation

## 2022-04-14 DIAGNOSIS — R918 Other nonspecific abnormal finding of lung field: Secondary | ICD-10-CM | POA: Insufficient documentation

## 2022-04-14 DIAGNOSIS — C662 Malignant neoplasm of left ureter: Secondary | ICD-10-CM | POA: Insufficient documentation

## 2022-04-14 DIAGNOSIS — C641 Malignant neoplasm of right kidney, except renal pelvis: Secondary | ICD-10-CM

## 2022-04-14 LAB — CMP (CANCER CENTER ONLY)
ALT: 14 U/L (ref 0–44)
AST: 25 U/L (ref 15–41)
Albumin: 4.4 g/dL (ref 3.5–5.0)
Alkaline Phosphatase: 48 U/L (ref 38–126)
Anion gap: 5 (ref 5–15)
BUN: 35 mg/dL — ABNORMAL HIGH (ref 8–23)
CO2: 31 mmol/L (ref 22–32)
Calcium: 10.4 mg/dL — ABNORMAL HIGH (ref 8.9–10.3)
Chloride: 100 mmol/L (ref 98–111)
Creatinine: 1.37 mg/dL — ABNORMAL HIGH (ref 0.44–1.00)
GFR, Estimated: 37 mL/min — ABNORMAL LOW (ref >60)
Glucose, Bld: 82 mg/dL (ref 70–99)
Potassium: 4.4 mmol/L (ref 3.5–5.1)
Sodium: 136 mmol/L (ref 135–145)
Total Bilirubin: 0.6 mg/dL (ref 0.3–1.2)
Total Protein: 6.9 g/dL (ref 6.5–8.1)

## 2022-04-14 LAB — CBC WITH DIFFERENTIAL (CANCER CENTER ONLY)
Abs Immature Granulocytes: 0.01 10*3/uL (ref 0.00–0.07)
Basophils Absolute: 0.1 10*3/uL (ref 0.0–0.1)
Basophils Relative: 1 %
Eosinophils Absolute: 0.4 10*3/uL (ref 0.0–0.5)
Eosinophils Relative: 8 %
HCT: 38.7 % (ref 36.0–46.0)
Hemoglobin: 12.5 g/dL (ref 12.0–15.0)
Immature Granulocytes: 0 %
Lymphocytes Relative: 32 %
Lymphs Abs: 1.6 10*3/uL (ref 0.7–4.0)
MCH: 29.4 pg (ref 26.0–34.0)
MCHC: 32.3 g/dL (ref 30.0–36.0)
MCV: 91.1 fL (ref 80.0–100.0)
Monocytes Absolute: 0.5 10*3/uL (ref 0.1–1.0)
Monocytes Relative: 10 %
Neutro Abs: 2.5 10*3/uL (ref 1.7–7.7)
Neutrophils Relative %: 49 %
Platelet Count: 228 10*3/uL (ref 150–400)
RBC: 4.25 MIL/uL (ref 3.87–5.11)
RDW: 16.2 % — ABNORMAL HIGH (ref 11.5–15.5)
WBC Count: 5.1 10*3/uL (ref 4.0–10.5)
nRBC: 0 % (ref 0.0–0.2)

## 2022-04-14 LAB — TSH: TSH: 47.733 u[IU]/mL — ABNORMAL HIGH (ref 0.350–4.500)

## 2022-04-14 MED ORDER — LEVOTHYROXINE SODIUM 50 MCG PO TABS: 50.0000 ug | ORAL_TABLET | Freq: Every day | ORAL | 3 refills | Status: DC

## 2022-04-14 NOTE — Progress Notes (Signed)
.Hematology and Oncology Follow Up   Kayla Bowers [id number] [birth year] 88 y.o. 04/14/2022 12:38 PM Kayla Bowers, MDHodges, [name], MD      Principle Diagnosis: 88 year old woman with T3N2 cancer of the left ureter diagnosed in 2021.  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    Prior Therapy:   He is status post robotic assisted right nephroureterectomy and right adrenalectomy completed by Dr. Lovena Neighbours on May 21, 2020.   Adjuvant chemotherapy utilizing carboplatin and gemcitabine started on July 11, 2020.  She completed 4 cycles of therapy in December 2021.  Nivolumab 480 mg every 4 weeks started on June 24, 2021.  She completed 7 cycles of therapy on December 24, 2021.   Current therapy: Active surveillance.  Interim History: [name] returns today for a follow-up.  Since the last visit, she reports slight improvement in her overall health.  She was hospitalized briefly at [facility] for symptoms of fatigue and lightheadedness.  She has improved in the last 30 days.  She also had her Port-A-Cath removed after port infection was noted in June 2023.  Currently she is eating well and moving more steadily but still has some unsteadiness and weakness.     Medications: Updated on review. Current Outpatient Medications  Medication Sig Dispense Refill   amLODipine (NORVASC) 5 MG tablet daily.     BioGaia Probiotic (BIOGAIA/GERBER SOOTHE) LIQD Take 5 drops by mouth daily at 8 pm.     fenofibrate 160 MG tablet Take 160 mg by mouth at bedtime.     gabapentin (NEURONTIN) 100 MG capsule 400 mg 3 (three) times daily.     hydrochlorothiazide (MICROZIDE) 12.5 MG capsule Take 12.5 mg by mouth 2 (two) times daily.     levothyroxine (SYNTHROID) 50 MCG tablet Take 1 tablet (50 mcg total) by mouth daily before breakfast. 60 tablet 1   lidocaine-prilocaine (EMLA) cream Apply 1 application topically as needed. 30 g 0   Multiple  Vitamins-Minerals (PRESERVISION AREDS PO) Take 1 capsule by mouth daily.     Multiple Vitamins-Minerals (PRESERVISION AREDS PO) Take by mouth daily.     prochlorperazine (COMPAZINE) 10 MG tablet Take 1 tablet (10 mg total) by mouth every 6 (six) hours as needed for nausea or vomiting. 30 tablet 0   No current facility-administered medications for this visit.     Allergies:  Allergies  Allergen Reactions   Flomax [Tamsulosin Hcl] Nausea Only    Physical examination:   Blood pressure (!) 124/56, pulse (!) 48, temperature 97.9 F (36.6 C), temperature source Temporal, resp. rate 17, height '5\' 5"'$  (1.651 m), weight 150 lb 12.8 oz (68.4 kg), SpO2 99 %.    ECOG 1    General appearance: Alert, awake without any distress. Head: Atraumatic without abnormalities Oropharynx: Without any thrush or ulcers. Eyes: No scleral icterus. Lymph nodes: No lymphadenopathy noted in the cervical, supraclavicular, or axillary nodes Heart:regular rate and rhythm, without any murmurs or gallops.   Lung: Clear to auscultation without any rhonchi, wheezes or dullness to percussion. Abdomin: Soft, nontender without any shifting dullness or ascites. Musculoskeletal: No clubbing or cyanosis. Neurological: No motor or sensory deficits. Skin: No rashes or lesions.           Lab Results: Lab Results  Component Value Date   WBC 8.6 03/15/2022   HGB 11.9 (L) 03/15/2022   HCT 36.0 03/15/2022   MCV 88.2 03/15/2022   PLT 335 03/15/2022     Chemistry  Component Value Date/Time   NA 133 (L) 03/15/2022 1336   K 3.9 03/15/2022 1336   CL 96 (L) 03/15/2022 1336   CO2 30 03/15/2022 1336   BUN 21 03/15/2022 1336   CREATININE 1.15 (H) 03/15/2022 1336      Component Value Date/Time   CALCIUM 10.2 03/15/2022 1336   ALKPHOS 62 03/15/2022 1336   AST 15 03/15/2022 1336   ALT 9 03/15/2022 1336   BILITOT 0.4 03/15/2022 1336         Impression and Plan:  88 year old woman with:   1.    Ureteral cancer diagnosed in 2021.  She developed stage IV high-grade urothelial carcinoma with adenopathy in 2023.  The natural course of this disease was reviewed at this time and treatment options were discussed.  I have opted to continue with active surveillance at this time and defer additional treatment unless she has relapsed disease.  Given her age and frail status unless she has symptomatic progression will hold off any treatment.  She will have repeat imaging studies in September 2023.      2.  IV access: Port-A-Cath removed without any issues.  Her cellulitis has resolved.       3.  Hypothyroidism: She is currently on Synthroid 50 mcg and will adjust the dosing if needed.    4.  Autoimmune complications: No residual complications at this time other than hypothyroidism.  5.  Pulmonary nodule: This will be monitored on the next scan scheduled in September 2023.     6.  Follow-up: In the next 6 to 8 weeks for repeat evaluation.  30  minutes were dedicated to this encounter.  Time spent on reviewing laboratory data, disease status update and outlining future plan of care discussion.       [name], MD 04/14/2022 12:38 PM

## 2022-04-16 ENCOUNTER — Telehealth: Payer: Self-pay | Admitting: Oncology

## 2022-04-16 NOTE — Telephone Encounter (Signed)
Called patient regarding upcoming September appointments, patient has been called and notified. 

## 2022-04-19 ENCOUNTER — Other Ambulatory Visit: Payer: Self-pay

## 2022-04-27 DIAGNOSIS — N183 Chronic kidney disease, stage 3 unspecified: Secondary | ICD-10-CM | POA: Diagnosis not present

## 2022-04-27 DIAGNOSIS — I959 Hypotension, unspecified: Secondary | ICD-10-CM | POA: Diagnosis not present

## 2022-04-27 DIAGNOSIS — I129 Hypertensive chronic kidney disease with stage 1 through stage 4 chronic kidney disease, or unspecified chronic kidney disease: Secondary | ICD-10-CM | POA: Diagnosis not present

## 2022-05-27 DIAGNOSIS — I129 Hypertensive chronic kidney disease with stage 1 through stage 4 chronic kidney disease, or unspecified chronic kidney disease: Secondary | ICD-10-CM | POA: Diagnosis not present

## 2022-05-28 DIAGNOSIS — I129 Hypertensive chronic kidney disease with stage 1 through stage 4 chronic kidney disease, or unspecified chronic kidney disease: Secondary | ICD-10-CM | POA: Diagnosis not present

## 2022-05-28 DIAGNOSIS — I959 Hypotension, unspecified: Secondary | ICD-10-CM | POA: Diagnosis not present

## 2022-05-28 DIAGNOSIS — C679 Malignant neoplasm of bladder, unspecified: Secondary | ICD-10-CM | POA: Diagnosis not present

## 2022-05-28 DIAGNOSIS — N183 Chronic kidney disease, stage 3 unspecified: Secondary | ICD-10-CM | POA: Diagnosis not present

## 2022-06-01 DIAGNOSIS — E039 Hypothyroidism, unspecified: Secondary | ICD-10-CM | POA: Diagnosis not present

## 2022-06-01 DIAGNOSIS — E782 Mixed hyperlipidemia: Secondary | ICD-10-CM | POA: Diagnosis not present

## 2022-06-01 DIAGNOSIS — I129 Hypertensive chronic kidney disease with stage 1 through stage 4 chronic kidney disease, or unspecified chronic kidney disease: Secondary | ICD-10-CM | POA: Diagnosis not present

## 2022-06-02 ENCOUNTER — Other Ambulatory Visit: Payer: Self-pay

## 2022-06-02 ENCOUNTER — Inpatient Hospital Stay: Payer: Medicare PPO | Attending: Oncology

## 2022-06-02 ENCOUNTER — Ambulatory Visit (HOSPITAL_COMMUNITY)
Admission: RE | Admit: 2022-06-02 | Discharge: 2022-06-02 | Disposition: A | Discharge status: Home / Self Care | Payer: Medicare PPO | Source: Ambulatory Visit | Attending: Oncology | Admitting: Oncology

## 2022-06-02 DIAGNOSIS — E039 Hypothyroidism, unspecified: Secondary | ICD-10-CM | POA: Insufficient documentation

## 2022-06-02 DIAGNOSIS — C641 Malignant neoplasm of right kidney, except renal pelvis: Secondary | ICD-10-CM

## 2022-06-02 DIAGNOSIS — C679 Malignant neoplasm of bladder, unspecified: Secondary | ICD-10-CM | POA: Diagnosis not present

## 2022-06-02 DIAGNOSIS — S2231XA Fracture of one rib, right side, initial encounter for closed fracture: Secondary | ICD-10-CM | POA: Diagnosis not present

## 2022-06-02 DIAGNOSIS — R911 Solitary pulmonary nodule: Secondary | ICD-10-CM | POA: Insufficient documentation

## 2022-06-02 DIAGNOSIS — K5730 Diverticulosis of large intestine without perforation or abscess without bleeding: Secondary | ICD-10-CM | POA: Diagnosis not present

## 2022-06-02 DIAGNOSIS — C649 Malignant neoplasm of unspecified kidney, except renal pelvis: Secondary | ICD-10-CM | POA: Diagnosis not present

## 2022-06-02 LAB — CMP (CANCER CENTER ONLY)
ALT: 18 U/L (ref 0–44)
AST: 29 U/L (ref 15–41)
Albumin: 4.5 g/dL (ref 3.5–5.0)
Alkaline Phosphatase: 57 U/L (ref 38–126)
Anion gap: 6 (ref 5–15)
BUN: 29 mg/dL — ABNORMAL HIGH (ref 8–23)
CO2: 31 mmol/L (ref 22–32)
Calcium: 10.6 mg/dL — ABNORMAL HIGH (ref 8.9–10.3)
Chloride: 97 mmol/L — ABNORMAL LOW (ref 98–111)
Creatinine: 1.29 mg/dL — ABNORMAL HIGH (ref 0.44–1.00)
GFR, Estimated: 39 mL/min — ABNORMAL LOW (ref >60)
Glucose, Bld: 95 mg/dL (ref 70–99)
Potassium: 4.4 mmol/L (ref 3.5–5.1)
Sodium: 134 mmol/L — ABNORMAL LOW (ref 135–145)
Total Bilirubin: 0.4 mg/dL (ref 0.3–1.2)
Total Protein: 7.3 g/dL (ref 6.5–8.1)

## 2022-06-02 LAB — CBC WITH DIFFERENTIAL (CANCER CENTER ONLY)
Abs Immature Granulocytes: 0.02 10*3/uL (ref 0.00–0.07)
Basophils Absolute: 0.1 10*3/uL (ref 0.0–0.1)
Basophils Relative: 1 %
Eosinophils Absolute: 0.5 10*3/uL (ref 0.0–0.5)
Eosinophils Relative: 8 %
HCT: 40.7 % (ref 36.0–46.0)
Hemoglobin: 13.5 g/dL (ref 12.0–15.0)
Immature Granulocytes: 0 %
Lymphocytes Relative: 30 %
Lymphs Abs: 1.8 10*3/uL (ref 0.7–4.0)
MCH: 29.6 pg (ref 26.0–34.0)
MCHC: 33.2 g/dL (ref 30.0–36.0)
MCV: 89.3 fL (ref 80.0–100.0)
Monocytes Absolute: 0.5 10*3/uL (ref 0.1–1.0)
Monocytes Relative: 8 %
Neutro Abs: 3.1 10*3/uL (ref 1.7–7.7)
Neutrophils Relative %: 53 %
Platelet Count: 276 10*3/uL (ref 150–400)
RBC: 4.56 MIL/uL (ref 3.87–5.11)
RDW: 13.8 % (ref 11.5–15.5)
WBC Count: 5.9 10*3/uL (ref 4.0–10.5)
nRBC: 0 % (ref 0.0–0.2)

## 2022-06-02 LAB — TSH: TSH: 50.663 u[IU]/mL — ABNORMAL HIGH (ref 0.350–4.500)

## 2022-06-02 MED ORDER — IOHEXOL 9 MG/ML PO SOLN
1000.0000 mL | Freq: Once | ORAL | Status: AC
  Administered 2022-06-02: 1000 mL via ORAL

## 2022-06-09 ENCOUNTER — Inpatient Hospital Stay (HOSPITAL_BASED_OUTPATIENT_CLINIC_OR_DEPARTMENT_OTHER): Payer: Medicare PPO | Admitting: Oncology

## 2022-06-09 VITALS — BP 136/60 | HR 87 | Temp 97.8°F | Resp 17 | Ht 65.0 in | Wt 148.8 lb

## 2022-06-09 DIAGNOSIS — C641 Malignant neoplasm of right kidney, except renal pelvis: Secondary | ICD-10-CM

## 2022-06-09 DIAGNOSIS — R911 Solitary pulmonary nodule: Secondary | ICD-10-CM | POA: Diagnosis not present

## 2022-06-09 DIAGNOSIS — E039 Hypothyroidism, unspecified: Secondary | ICD-10-CM | POA: Diagnosis not present

## 2022-06-09 DIAGNOSIS — C679 Malignant neoplasm of bladder, unspecified: Secondary | ICD-10-CM | POA: Diagnosis not present

## 2022-06-09 MED ORDER — LEVOTHYROXINE SODIUM 25 MCG PO TABS: 25.0000 ug | ORAL_TABLET | Freq: Every day | ORAL | 1 refills | Status: DC

## 2022-06-09 NOTE — Progress Notes (Signed)
.Hematology and Oncology Follow Up   LEVEDA KENDRIX [id number] Jun 02, 1929 88 y.o. 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  He initially presented with localized disease in 2021.    Prior Therapy:   He is status post robotic assisted right nephroureterectomy and right adrenalectomy completed by Dr. Lovena Neighbours on May 21, 2020.   Adjuvant chemotherapy utilizing carboplatin and gemcitabine started on July 11, 2020.  She completed 4 cycles of therapy in December 2021.  Nivolumab 480 mg every 4 weeks started on June 24, 2021.  She completed 7 cycles of therapy on December 24, 2021.  She achieved a near complete response to therapy.   Current therapy: Active surveillance.  Interim History: Ms. Tristan returns today for repeat evaluation.  Since her last visit, she reports feeling well without any major complaints.  She has reported some occasional unsteadiness and dizziness without any falls or syncope.  She denies any bone pain pathological fracture.  She denies any shortness of breath or difficulty breathing.  She denies any worsening neuropathy.     Medications: Reviewed without changes. Current Outpatient Medications  Medication Sig Dispense Refill   amLODipine (NORVASC) 5 MG tablet daily.     BioGaia Probiotic (BIOGAIA/GERBER SOOTHE) LIQD Take 5 drops by mouth daily at 8 pm.     fenofibrate 160 MG tablet Take 160 mg by mouth at bedtime.     gabapentin (NEURONTIN) 100 MG capsule 400 mg 3 (three) times daily.     hydrochlorothiazide (MICROZIDE) 12.5 MG capsule Take 12.5 mg by mouth 2 (two) times daily.     levothyroxine (SYNTHROID) 50 MCG tablet Take 1 tablet (50 mcg total) by mouth daily before breakfast. 60 tablet 3   lidocaine-prilocaine (EMLA) cream Apply 1 application topically as needed. 30 g 0   Multiple  Vitamins-Minerals (PRESERVISION AREDS PO) Take 1 capsule by mouth daily.     Multiple Vitamins-Minerals (PRESERVISION AREDS PO) Take by mouth daily.     prochlorperazine (COMPAZINE) 10 MG tablet Take 1 tablet (10 mg total) by mouth every 6 (six) hours as needed for nausea or vomiting. 30 tablet 0   No current facility-administered medications for this visit.     Allergies:  Allergies  Allergen Reactions   Flomax [Tamsulosin Hcl] Nausea Only    Physical examination:    Blood pressure 136/60, pulse 87, temperature 97.8 F (36.6 C), temperature source Temporal, resp. rate 17, height '5\' 5"'$  (1.651 m), weight 148 lb 12.8 oz (67.5 kg), SpO2 97 %.    ECOG 1   General appearance: Comfortable appearing without any discomfort Head: Normocephalic without any trauma Oropharynx: Mucous membranes are moist and pink without any thrush or ulcers. Eyes: Pupils are equal and round reactive to light. Lymph nodes: No cervical, supraclavicular, inguinal or axillary lymphadenopathy.   Heart:regular rate and rhythm.  S1 and S2 without leg edema. Lung: Clear without any rhonchi or wheezes.  No dullness to percussion. Abdomin: Soft, nontender, nondistended with good bowel sounds.  No hepatosplenomegaly. Musculoskeletal: No joint deformity or effusion.  Full range of motion noted. Neurological: No deficits noted on motor, sensory and deep tendon reflex exam. Skin: No petechial rash or dryness.  Appeared moist.            Lab Results: Lab Results  Component Value Date   WBC 5.9 06/02/2022   HGB 13.5 06/02/2022   HCT  40.7 06/02/2022   MCV 89.3 06/02/2022   PLT 276 06/02/2022     Chemistry      Component Value Date/Time   NA 134 (L) 06/02/2022 1336   K 4.4 06/02/2022 1336   CL 97 (L) 06/02/2022 1336   CO2 31 06/02/2022 1336   BUN 29 (H) 06/02/2022 1336   CREATININE 1.29 (H) 06/02/2022 1336      Component Value Date/Time   CALCIUM 10.6 (H) 06/02/2022 1336   ALKPHOS 57 06/02/2022  1336   AST 29 06/02/2022 1336   ALT 18 06/02/2022 1336   BILITOT 0.4 06/02/2022 1336       IMPRESSION: 1. Reduction in density left upper lobe pulmonary nodule. Additional pulmonary nodules noted previously have resolved. No new or enlarging pulmonary nodules. 2. No evidence of metastatic disease within the abdomen or pelvis. 3. Acute mildly displaced fracture of the right 10th rib laterally. No pneumothorax or significant pleural effusion. Old rib and spinal fractures otherwise stable. 4. Stable incidental findings including Coronary and aortic atherosclerosis (ICD10-I70.0). Emphysema (ICD10-J43.9).  Impression and Plan:  88 year old woman with:   1.   Stage IV high-grade urothelial carcinoma with pelvic adenopathy documented in 2023.   The natural course of her disease was reviewed and treatment choices were discussed.  She has complete response to immunotherapy with very little evidence to suggest metastatic disease.  She has no evidence of adenopathy in the left upper lobe pulmonary nodule likely reflects active changes or less likely malignancy.  Given her overall health status and disease status I recommended continued active surveillance and we will repeat imaging studies in 6 months.          2.  Hypothyroidism: Her TSH continues to be elevated and will adjust her Synthroid dosing.  She is currently on 75 mcg and I will increase it to 100 and we will repeat laboratory testing in 3 months.    3.  Pulmonary nodule: CT scan obtained on 06/02/2022 was personally reviewed and showed overall regression of these pulmonary nodules and decrease in density.  Based on these findings it is unlikely that we are dealing with metastatic disease.     4.  Follow-up: Laboratory testing in 3 months repeat imaging studies in 6 months.  30  minutes were spent on this visit.  The time was dedicated to reviewing laboratory data, disease status update and outlining future plan of care  discussion.      [name], MD 06/09/2022 11:44 AM

## 2022-06-10 ENCOUNTER — Other Ambulatory Visit: Payer: Self-pay

## 2022-06-14 ENCOUNTER — Other Ambulatory Visit: Payer: Self-pay

## 2022-06-17 DIAGNOSIS — E039 Hypothyroidism, unspecified: Secondary | ICD-10-CM | POA: Diagnosis not present

## 2022-06-17 DIAGNOSIS — E782 Mixed hyperlipidemia: Secondary | ICD-10-CM | POA: Diagnosis not present

## 2022-06-17 DIAGNOSIS — N1832 Chronic kidney disease, stage 3b: Secondary | ICD-10-CM | POA: Diagnosis not present

## 2022-06-17 DIAGNOSIS — Z6824 Body mass index (BMI) 24.0-24.9, adult: Secondary | ICD-10-CM | POA: Diagnosis not present

## 2022-06-17 DIAGNOSIS — G622 Polyneuropathy due to other toxic agents: Secondary | ICD-10-CM | POA: Diagnosis not present

## 2022-06-26 DIAGNOSIS — I129 Hypertensive chronic kidney disease with stage 1 through stage 4 chronic kidney disease, or unspecified chronic kidney disease: Secondary | ICD-10-CM | POA: Diagnosis not present

## 2022-06-27 DIAGNOSIS — N183 Chronic kidney disease, stage 3 unspecified: Secondary | ICD-10-CM | POA: Diagnosis not present

## 2022-06-27 DIAGNOSIS — I129 Hypertensive chronic kidney disease with stage 1 through stage 4 chronic kidney disease, or unspecified chronic kidney disease: Secondary | ICD-10-CM | POA: Diagnosis not present

## 2022-06-27 DIAGNOSIS — C679 Malignant neoplasm of bladder, unspecified: Secondary | ICD-10-CM | POA: Diagnosis not present

## 2022-07-08 DIAGNOSIS — Z23 Encounter for immunization: Secondary | ICD-10-CM | POA: Diagnosis not present

## 2022-07-08 DIAGNOSIS — E039 Hypothyroidism, unspecified: Secondary | ICD-10-CM | POA: Diagnosis not present

## 2022-07-27 DIAGNOSIS — H9193 Unspecified hearing loss, bilateral: Secondary | ICD-10-CM | POA: Diagnosis not present

## 2022-07-27 DIAGNOSIS — E039 Hypothyroidism, unspecified: Secondary | ICD-10-CM | POA: Diagnosis not present

## 2022-07-27 DIAGNOSIS — I129 Hypertensive chronic kidney disease with stage 1 through stage 4 chronic kidney disease, or unspecified chronic kidney disease: Secondary | ICD-10-CM | POA: Diagnosis not present

## 2022-07-27 DIAGNOSIS — Z6824 Body mass index (BMI) 24.0-24.9, adult: Secondary | ICD-10-CM | POA: Diagnosis not present

## 2022-07-27 DIAGNOSIS — R2681 Unsteadiness on feet: Secondary | ICD-10-CM | POA: Diagnosis not present

## 2022-07-28 DIAGNOSIS — C679 Malignant neoplasm of bladder, unspecified: Secondary | ICD-10-CM | POA: Diagnosis not present

## 2022-07-28 DIAGNOSIS — I129 Hypertensive chronic kidney disease with stage 1 through stage 4 chronic kidney disease, or unspecified chronic kidney disease: Secondary | ICD-10-CM | POA: Diagnosis not present

## 2022-07-28 DIAGNOSIS — N183 Chronic kidney disease, stage 3 unspecified: Secondary | ICD-10-CM | POA: Diagnosis not present

## 2022-08-17 ENCOUNTER — Telehealth: Payer: Self-pay | Admitting: Oncology

## 2022-08-17 DIAGNOSIS — E039 Hypothyroidism, unspecified: Secondary | ICD-10-CM | POA: Diagnosis not present

## 2022-08-17 DIAGNOSIS — R001 Bradycardia, unspecified: Secondary | ICD-10-CM | POA: Diagnosis not present

## 2022-08-17 NOTE — Telephone Encounter (Signed)
Called patient regarding December appointment, patient has been called and notified. 

## 2022-08-24 DIAGNOSIS — Z139 Encounter for screening, unspecified: Secondary | ICD-10-CM | POA: Diagnosis not present

## 2022-08-24 DIAGNOSIS — E039 Hypothyroidism, unspecified: Secondary | ICD-10-CM | POA: Diagnosis not present

## 2022-08-24 DIAGNOSIS — Z1331 Encounter for screening for depression: Secondary | ICD-10-CM | POA: Diagnosis not present

## 2022-08-24 DIAGNOSIS — Z6824 Body mass index (BMI) 24.0-24.9, adult: Secondary | ICD-10-CM | POA: Diagnosis not present

## 2022-08-24 DIAGNOSIS — G622 Polyneuropathy due to other toxic agents: Secondary | ICD-10-CM | POA: Diagnosis not present

## 2022-08-24 DIAGNOSIS — R5381 Other malaise: Secondary | ICD-10-CM | POA: Diagnosis not present

## 2022-08-27 DIAGNOSIS — N183 Chronic kidney disease, stage 3 unspecified: Secondary | ICD-10-CM | POA: Diagnosis not present

## 2022-08-27 DIAGNOSIS — I129 Hypertensive chronic kidney disease with stage 1 through stage 4 chronic kidney disease, or unspecified chronic kidney disease: Secondary | ICD-10-CM | POA: Diagnosis not present

## 2022-08-27 DIAGNOSIS — C679 Malignant neoplasm of bladder, unspecified: Secondary | ICD-10-CM | POA: Diagnosis not present

## 2022-09-06 DIAGNOSIS — R001 Bradycardia, unspecified: Secondary | ICD-10-CM | POA: Diagnosis not present

## 2022-09-07 DIAGNOSIS — G622 Polyneuropathy due to other toxic agents: Secondary | ICD-10-CM | POA: Diagnosis not present

## 2022-09-07 DIAGNOSIS — L409 Psoriasis, unspecified: Secondary | ICD-10-CM | POA: Diagnosis not present

## 2022-09-07 DIAGNOSIS — R538 Other malaise and fatigue: Secondary | ICD-10-CM | POA: Diagnosis not present

## 2022-09-07 DIAGNOSIS — H9193 Unspecified hearing loss, bilateral: Secondary | ICD-10-CM | POA: Diagnosis not present

## 2022-09-07 DIAGNOSIS — Z6824 Body mass index (BMI) 24.0-24.9, adult: Secondary | ICD-10-CM | POA: Diagnosis not present

## 2022-09-09 ENCOUNTER — Inpatient Hospital Stay: Payer: Medicare PPO

## 2022-09-09 ENCOUNTER — Inpatient Hospital Stay: Payer: Medicare PPO | Attending: Oncology

## 2022-09-09 ENCOUNTER — Inpatient Hospital Stay: Payer: Medicare PPO | Admitting: Oncology

## 2022-09-09 VITALS — BP 161/61 | HR 81 | Temp 97.9°F | Resp 18 | Ht 65.0 in | Wt 146.7 lb

## 2022-09-09 DIAGNOSIS — C641 Malignant neoplasm of right kidney, except renal pelvis: Secondary | ICD-10-CM

## 2022-09-09 DIAGNOSIS — C679 Malignant neoplasm of bladder, unspecified: Secondary | ICD-10-CM | POA: Insufficient documentation

## 2022-09-09 DIAGNOSIS — C651 Malignant neoplasm of right renal pelvis: Secondary | ICD-10-CM | POA: Insufficient documentation

## 2022-09-09 DIAGNOSIS — E039 Hypothyroidism, unspecified: Secondary | ICD-10-CM | POA: Insufficient documentation

## 2022-09-09 DIAGNOSIS — Z79899 Other long term (current) drug therapy: Secondary | ICD-10-CM | POA: Diagnosis not present

## 2022-09-09 DIAGNOSIS — Z79890 Hormone replacement therapy (postmenopausal): Secondary | ICD-10-CM | POA: Diagnosis not present

## 2022-09-09 LAB — CBC WITH DIFFERENTIAL (CANCER CENTER ONLY)
Abs Immature Granulocytes: 0.01 10*3/uL (ref 0.00–0.07)
Basophils Absolute: 0 10*3/uL (ref 0.0–0.1)
Basophils Relative: 1 %
Eosinophils Absolute: 0.3 10*3/uL (ref 0.0–0.5)
Eosinophils Relative: 7 %
HCT: 37.9 % (ref 36.0–46.0)
Hemoglobin: 12.3 g/dL (ref 12.0–15.0)
Immature Granulocytes: 0 %
Lymphocytes Relative: 32 %
Lymphs Abs: 1.2 10*3/uL (ref 0.7–4.0)
MCH: 30.1 pg (ref 26.0–34.0)
MCHC: 32.5 g/dL (ref 30.0–36.0)
MCV: 92.7 fL (ref 80.0–100.0)
Monocytes Absolute: 0.6 10*3/uL (ref 0.1–1.0)
Monocytes Relative: 15 %
Neutro Abs: 1.8 10*3/uL (ref 1.7–7.7)
Neutrophils Relative %: 45 %
Platelet Count: 252 10*3/uL (ref 150–400)
RBC: 4.09 MIL/uL (ref 3.87–5.11)
RDW: 15.6 % — ABNORMAL HIGH (ref 11.5–15.5)
WBC Count: 3.9 10*3/uL — ABNORMAL LOW (ref 4.0–10.5)
nRBC: 0 % (ref 0.0–0.2)

## 2022-09-09 LAB — CMP (CANCER CENTER ONLY)
ALT: 18 U/L (ref 0–44)
AST: 23 U/L (ref 15–41)
Albumin: 4 g/dL (ref 3.5–5.0)
Alkaline Phosphatase: 67 U/L (ref 38–126)
Anion gap: 3 — ABNORMAL LOW (ref 5–15)
BUN: 27 mg/dL — ABNORMAL HIGH (ref 8–23)
CO2: 35 mmol/L — ABNORMAL HIGH (ref 22–32)
Calcium: 10.8 mg/dL — ABNORMAL HIGH (ref 8.9–10.3)
Chloride: 103 mmol/L (ref 98–111)
Creatinine: 1.15 mg/dL — ABNORMAL HIGH (ref 0.44–1.00)
GFR, Estimated: 45 mL/min — ABNORMAL LOW (ref >60)
Glucose, Bld: 87 mg/dL (ref 70–99)
Potassium: 4.5 mmol/L (ref 3.5–5.1)
Sodium: 141 mmol/L (ref 135–145)
Total Bilirubin: 0.5 mg/dL (ref 0.3–1.2)
Total Protein: 6.4 g/dL — ABNORMAL LOW (ref 6.5–8.1)

## 2022-09-09 LAB — TSH: TSH: 0.772 u[IU]/mL (ref 0.350–4.500)

## 2022-09-09 NOTE — Progress Notes (Signed)
.Hematology and Oncology Follow Up   Kayla Bowers [id number] [birth year] 88 y.o. 09/09/2022 10:25 AM [name], MDHodges, [name], MD      Principle Diagnosis: 88 year old woman with right renal pelvis cancer diagnosed in 2021.  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  He initially presented with localized disease in 2021.    Prior Therapy:   He is status post robotic assisted right nephroureterectomy and right adrenalectomy completed by Dr. Lovena Neighbours on May 21, 2020.   Adjuvant chemotherapy utilizing carboplatin and gemcitabine started on July 11, 2020.  She completed 4 cycles of therapy in December 2021.  Nivolumab 480 mg every 4 weeks started on June 24, 2021.  She completed 7 cycles of therapy on December 24, 2021.  She achieved a near complete response to therapy.   Current therapy: Active surveillance.  Interim History: Ms. Capili presents today for a follow-up visit.  Since the last visit, she reports no major changes in her health.  She continues to feel better but her thyroid supplements have been adjusted by her primary care physician.  She was also started on Lyrica which was better tolerated Neurontin.  She is ambulating without any major difficulties.  She denies any falls or syncope.     Medications: Reviewed without changes. Current Outpatient Medications  Medication Sig Dispense Refill   amLODipine (NORVASC) 5 MG tablet daily.     BioGaia Probiotic (BIOGAIA/GERBER SOOTHE) LIQD Take 5 drops by mouth daily at 8 pm.     fenofibrate 160 MG tablet Take 160 mg by mouth at bedtime.     gabapentin (NEURONTIN) 100 MG capsule 400 mg 3 (three) times daily.     hydrochlorothiazide (MICROZIDE) 12.5 MG capsule Take 12.5 mg by mouth 2 (two) times daily.     levothyroxine (SYNTHROID) 25 MCG tablet Take 1 tablet (25 mcg total) by mouth daily before breakfast. Take  with the 75 mcg tablet for a total of 100 mcg daily 30 tablet 1   lidocaine-prilocaine (EMLA) cream Apply 1 application topically as needed. 30 g 0   Multiple Vitamins-Minerals (PRESERVISION AREDS PO) Take 1 capsule by mouth daily.     Multiple Vitamins-Minerals (PRESERVISION AREDS PO) Take by mouth daily.     prochlorperazine (COMPAZINE) 10 MG tablet Take 1 tablet (10 mg total) by mouth every 6 (six) hours as needed for nausea or vomiting. 30 tablet 0   No current facility-administered medications for this visit.     Allergies:  Allergies  Allergen Reactions   Flomax [Tamsulosin Hcl] Nausea Only    Physical examination:     Blood pressure (!) 161/61, pulse 81, temperature 97.9 F (36.6 C), temperature source Temporal, resp. rate 18, height '5\' 5"'$  (1.651 m), weight 146 lb 11.2 oz (66.5 kg), SpO2 98 %.    ECOG 1    General appearance: Alert, awake without any distress. Head: Atraumatic without abnormalities Oropharynx: Without any thrush or ulcers. Eyes: No scleral icterus. Lymph nodes: No lymphadenopathy noted in the cervical, supraclavicular, or axillary nodes Heart:regular rate and rhythm, without any murmurs or gallops.   Lung: Clear to auscultation without any rhonchi, wheezes or dullness to percussion. Abdomin: Soft, nontender without any shifting dullness or ascites. Musculoskeletal: No clubbing or cyanosis. Neurological: No motor or sensory deficits. Skin: No rashes or lesions.            Lab Results: Lab Results  Component Value Date   WBC 5.9  06/02/2022   HGB 13.5 06/02/2022   HCT 40.7 06/02/2022   MCV 89.3 06/02/2022   PLT 276 06/02/2022     Chemistry      Component Value Date/Time   NA 134 (L) 06/02/2022 1336   K 4.4 06/02/2022 1336   CL 97 (L) 06/02/2022 1336   CO2 31 06/02/2022 1336   BUN 29 (H) 06/02/2022 1336   CREATININE 1.29 (H) 06/02/2022 1336      Component Value Date/Time   CALCIUM 10.6 (H) 06/02/2022 1336   ALKPHOS 57  06/02/2022 1336   AST 29 06/02/2022 1336   ALT 18 06/02/2022 1336   BILITOT 0.4 06/02/2022 1336        Impression and Plan:  88 year old woman with:   1.   Right renal pelvis cancer diagnosed in 2021.  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   Her disease status was updated at this time and treatment choices were reviewed.  She is currently on active surveillance after she has a complete response to nivolumab therapy.  CT scan obtained in September 2023 showed no clear-cut disease progression.  At this time, I recommended continued monitoring and repeat imaging studies in 3 months.  Different salvage therapy options include retreatment with immunotherapy alone versus in combination with Padcev or Padcev alone could be considered.          2.  Hypothyroidism: Related to immunotherapy and currently on thyroid replacement.  This will be monitored by her primary care physician.    3.  Pulmonary nodule: Resolved based on recent imaging studies.     4.  Follow-up: I recommended follow-up in 3 months with repeat imaging studies.  We will arrange follow-up at [place] which is closer to her home.   30  minutes were dedicated to this encounter.  The time was spent on updating disease status, treatment choices and future plan of care review.      [name], MD 09/09/2022 10:25 AM

## 2022-09-19 IMAGING — CT CT ABD-PELV W/O CM
2 of 4 series · 12 of 36 positions shown, 15 images · non-contrast
Comparison: Multiple exams, including 10/22/2021

CLINICAL DATA: Stage IV high-grade urothelial carcinoma, restaging.
Prior robotic assisted right nephroureterectomy and right
adrenalectomy in April 2020. Current therapy: Nivolumab.
Surveillance of pulmonary nodules.

* Tracking Code: BO *
EXAM:
CT CHEST, ABDOMEN AND PELVIS WITHOUT CONTRAST
TECHNIQUE: Multidetector CT imaging of the chest, abdomen and pelvis was
performed following the standard protocol without IV contrast.
RADIATION DOSE REDUCTION: This exam was performed according to the
departmental dose-optimization program which includes automated
exposure control, adjustment of the mA and/or kV according to
patient size and/or use of iterative reconstruction technique.

[Series 2: cap w/o · axial · non-contrast · 0.86mm/px · z∈[-275,+215]mm · 9 of 124 slices shown, 12 images]
[im 13/124  mediastinal]
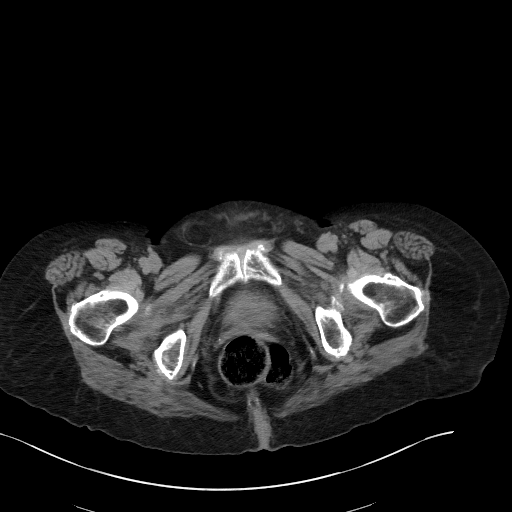
[im 13/124  lung]
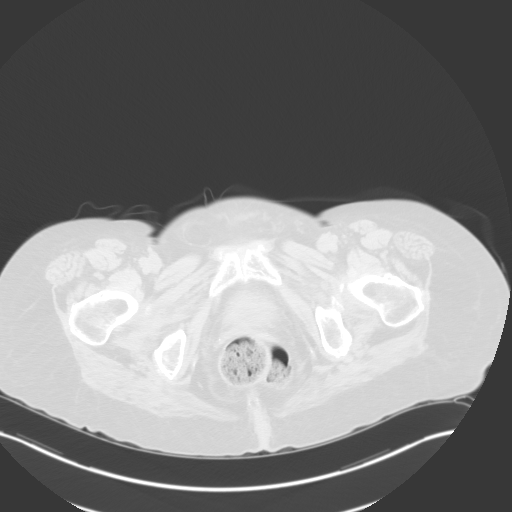
[im 25/124  lung]
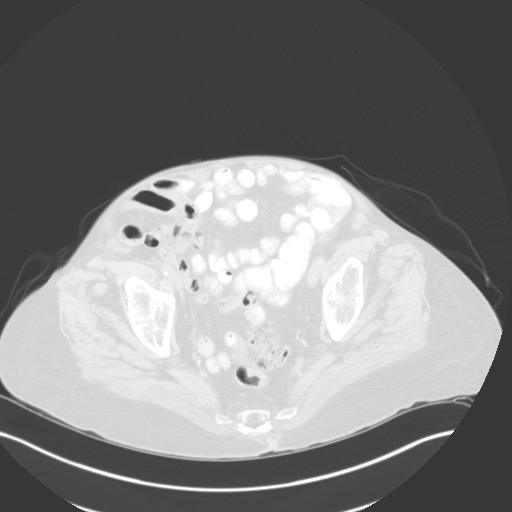
[im 37/124  lung]
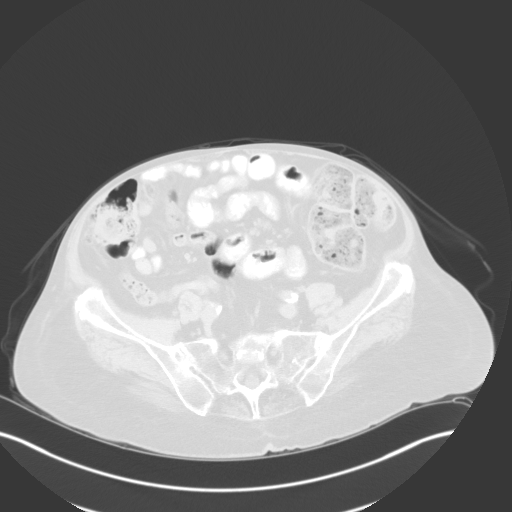
[im 50/124  lung]
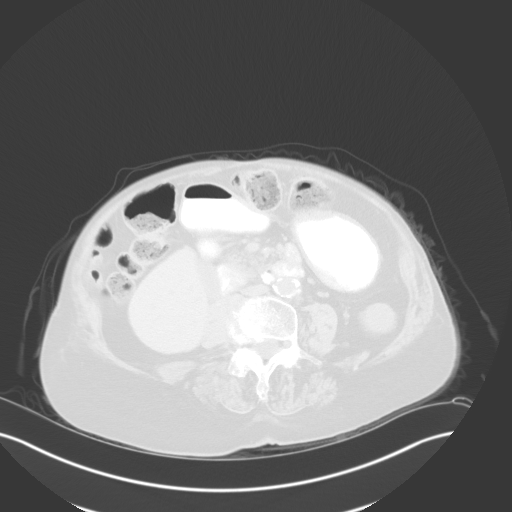
[im 62/124  mediastinal]
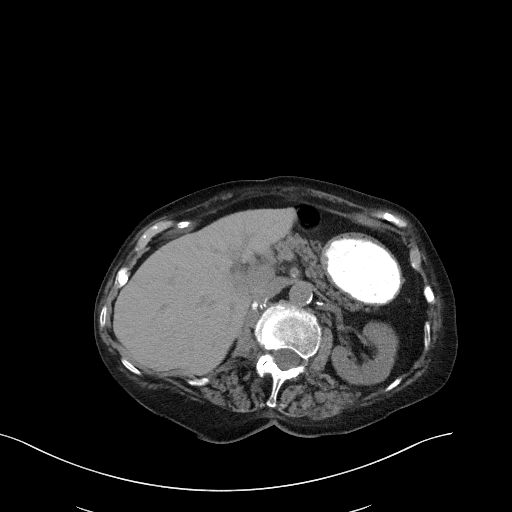
[im 62/124  lung]
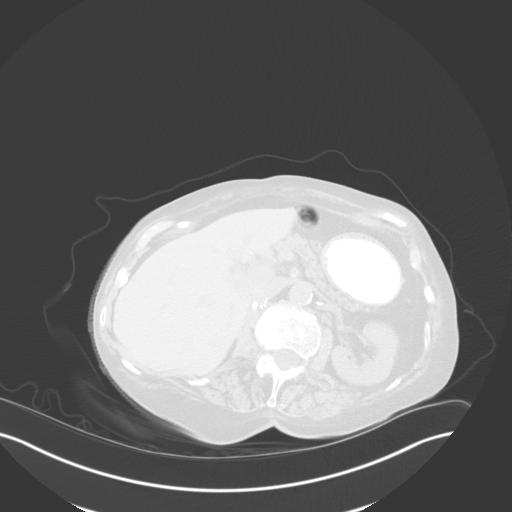
[im 74/124  lung]
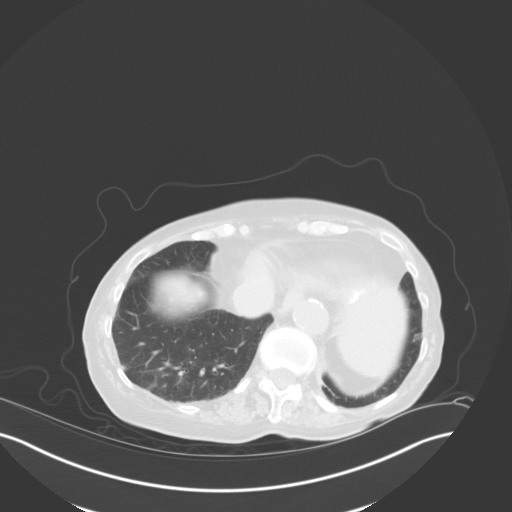
[im 87/124  lung]
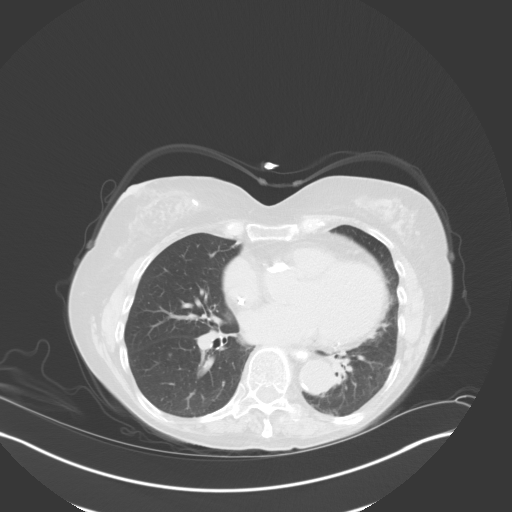
[im 99/124  lung]
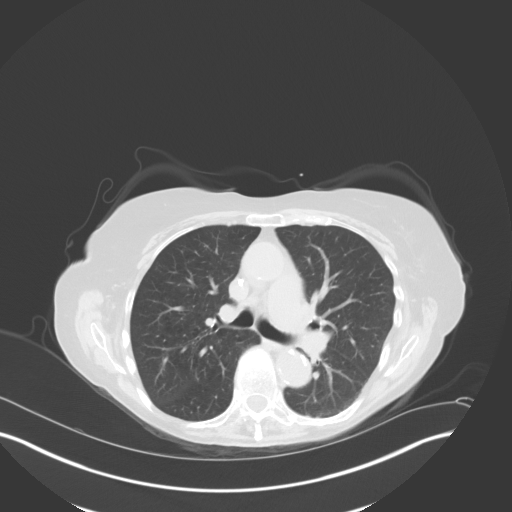
[im 111/124  mediastinal]
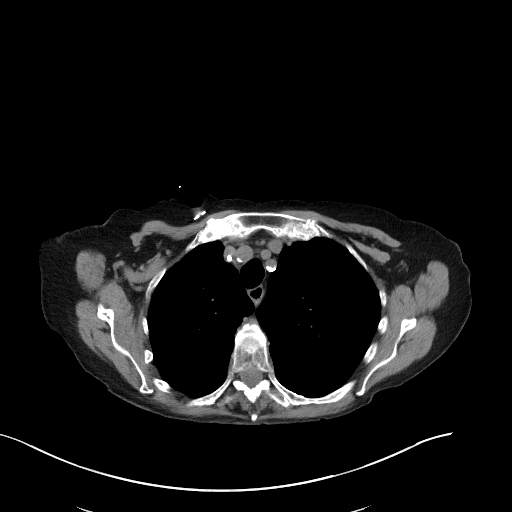
[im 111/124  lung]
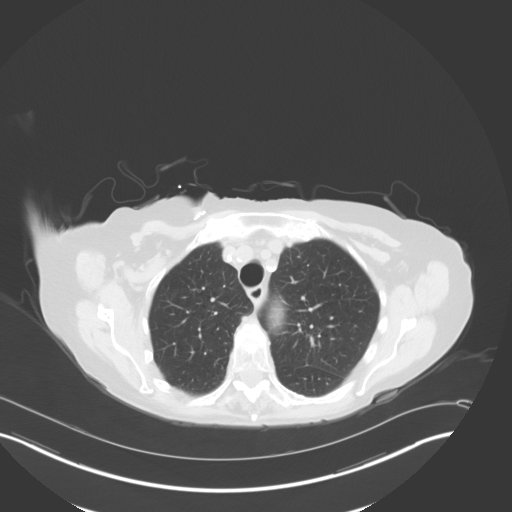

[Series 5: coronals · coronal · 0.70mm/px · 3 of 148 slices shown]
[im 30/148  lung]
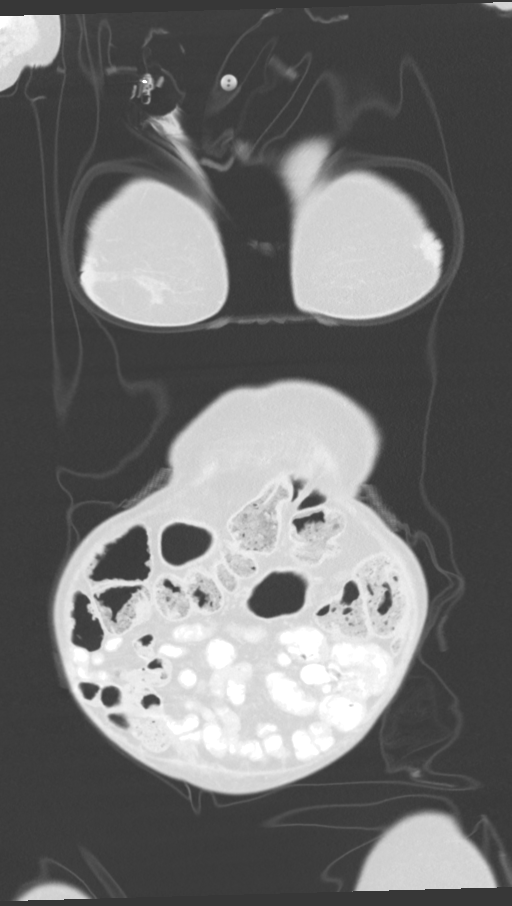
[im 59/148  lung]
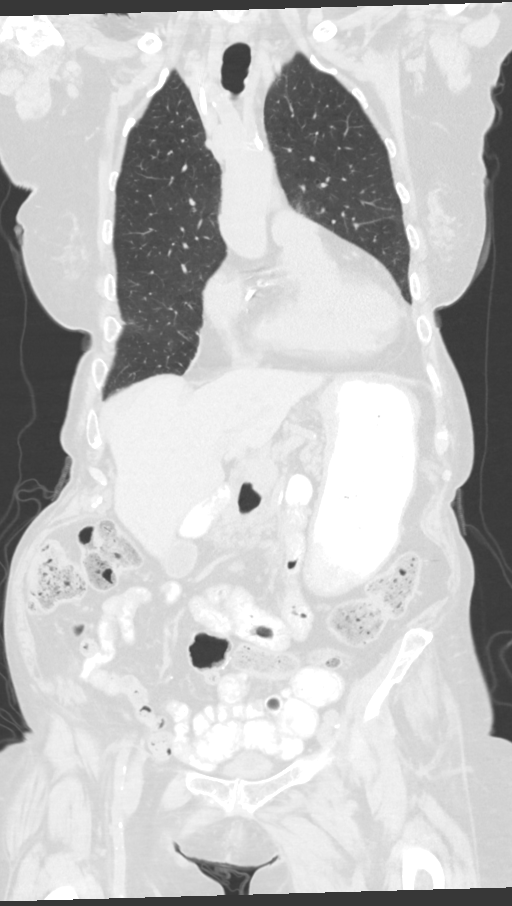
[im 89/148  lung]
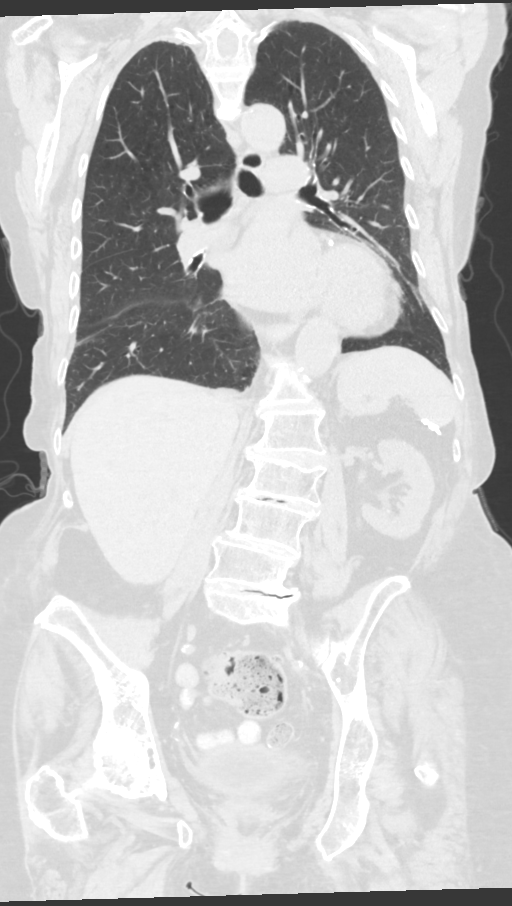

[12 of 36 positions shown; findings below may reference images not displayed]

FINDINGS: CT CHEST FINDINGS

Cardiovascular: Right Port-A-Cath tip: Cavoatrial junction.
Coronary, aortic arch, and branch vessel atherosclerotic vascular
disease. Mild to moderate cardiomegaly.

Mediastinum/Nodes: Unremarkable

Lungs/Pleura: Biapical pleuroparenchymal scarring. 7 by 4 mm left
upper lobe nodule on image 25 series 4, previously 9 by 4 mm by my
measurements. Continued peribronchovascular volume loss in the left
lower lobe. There is some scattered subpleural ground-glass
densities at the lung basis which are similar to the prior exam in
likely primarily from atelectasis or scarring.

The right upper lobe peribronchovascular nodularity previously
measured at 6 mm has essentially resolved.

The 5 mm ground-glass density right lower lobe nodule described on
the prior exam has essentially resolved.

There is some mildly increased subpleural nodularity in the right
lower lobe including a 4 mm subpleural nodule on image 126 of series
4 with some hazy adjacent scarring, and a 0.7 by 0.4 cm right lower
lobe subpleural nodule on image 131 series 4 which is new and along
a region of prior scarring. These are likely inflammatory but merit
surveillance.

Musculoskeletal: Healing right proximal humeral fracture with
residual deformity and continued visualization of much of the
fracture plane. Stable sharply defined sclerotic lesion in the T4
vertebral body, probably a bone island. Thoracic spondylosis.

CT ABDOMEN PELVIS FINDINGS

Hepatobiliary: Mildly contracted gallbladder. Otherwise
unremarkable.

Pancreas: Unremarkable

Spleen: Unremarkable

Adrenals/Urinary Tract: Right nephroureterectomy. Unchanged small
left adrenal adenoma. No significant urinary bladder abnormality.
Right renal contour normal.

Stomach/Bowel: Multiple duodenal diverticula. Postoperative findings
in the sigmoid colon. Prominent stool throughout the colon favors
constipation. No dilated bowel observed.

Vascular/Lymphatic: Atherosclerosis is present, including aortoiliac
atherosclerotic disease. There is atherosclerotic calcification
proximally in the superior mesenteric artery and proximally in the
left renal artery. No pathologic adenopathy observed.

Reproductive: Uterus absent.  Adnexa unremarkable.

Other: No supplemental non-categorized findings.

Musculoskeletal: Lax anterior abdominal wall musculature. Levoconvex
thoracolumbar scoliosis with rotary component. Degenerative hip
arthropathy bilaterally.
IMPRESSION: 1. Reduced size of the left upper lobe nodule, currently 7 by 4 mm.
Some of the previously defined small subpleural nodules have
resolved. There are other areas of mildly increased subpleural
nodularity in the right lower lobe which merit surveillance.
2. No pathologic adenopathy identified.
3. Other imaging findings of potential clinical significance: Aortic
Atherosclerosis ([id number]). Coronary atherosclerosis. Mild to
moderate cardiomegaly. Healing right proximal humeral fracture with
continued visualization of much of the dominant fracture plane.
Right nephroureterectomy. Small left adrenal adenoma. Multiple
duodenal diverticula. Prominent stool throughout the colon favors
constipation. Postoperative findings in the sigmoid colon. Lax
anterior abdominal wall musculature. Levoconvex thoracolumbar
scoliosis with rotary component. Degenerative hip arthropathy
bilaterally.

## 2022-09-19 IMAGING — CT CT CHEST W/O CM
2 of 5 series · 12 of 36 positions shown, 15 images · non-contrast
Comparison: Multiple exams, including 10/22/2021

CLINICAL DATA: Stage IV high-grade urothelial carcinoma, restaging.
Prior robotic assisted right nephroureterectomy and right
adrenalectomy in April 2020. Current therapy: Nivolumab.
Surveillance of pulmonary nodules.

* Tracking Code: BO *
EXAM:
CT CHEST, ABDOMEN AND PELVIS WITHOUT CONTRAST
TECHNIQUE: Multidetector CT imaging of the chest, abdomen and pelvis was
performed following the standard protocol without IV contrast.
RADIATION DOSE REDUCTION: This exam was performed according to the
departmental dose-optimization program which includes automated
exposure control, adjustment of the mA and/or kV according to
patient size and/or use of iterative reconstruction technique.

[Series 504: thins · axial · 0.86mm/px · z∈[-277,+218]mm · 9 of 885 slices shown, 12 images]
[im 89/885  mediastinal]
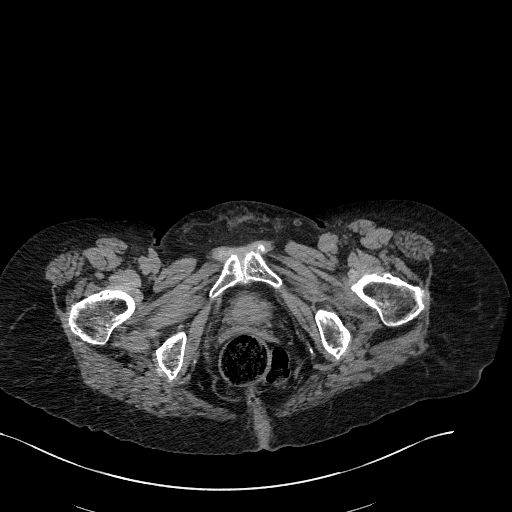
[im 89/885  lung]
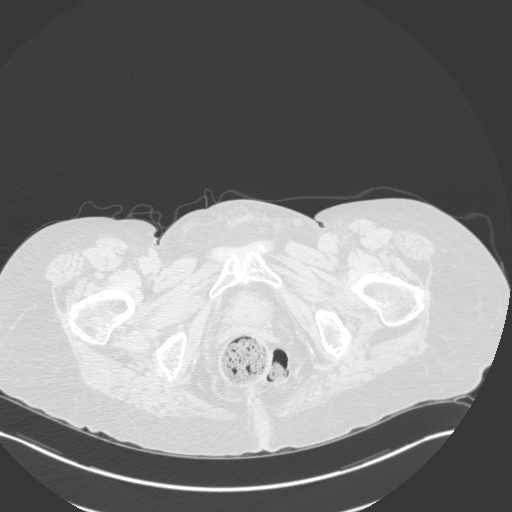
[im 177/885  lung]
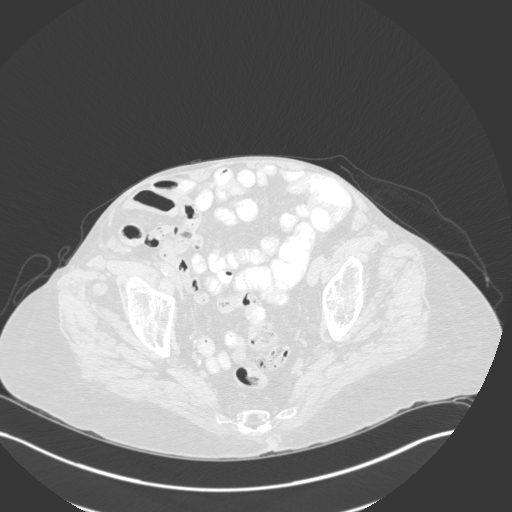
[im 266/885  lung]
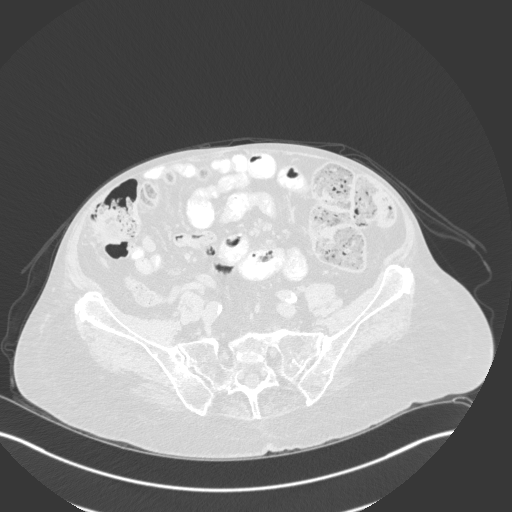
[im 354/885  lung]
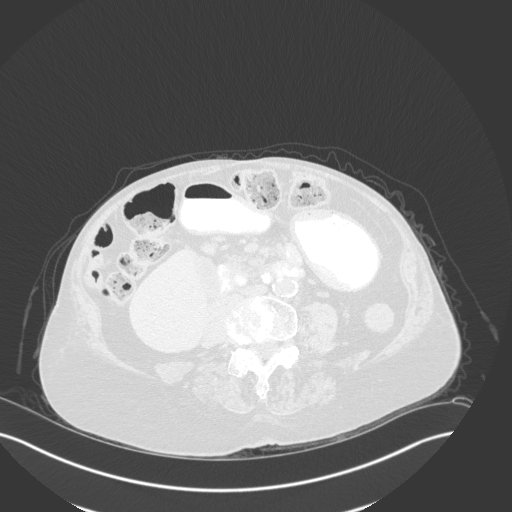
[im 443/885  mediastinal]
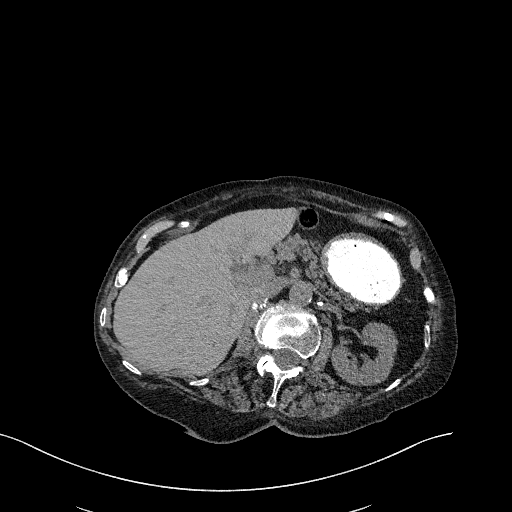
[im 443/885  lung]
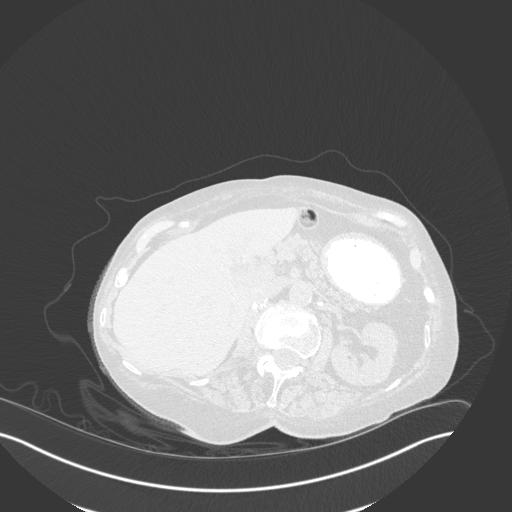
[im 531/885  lung]
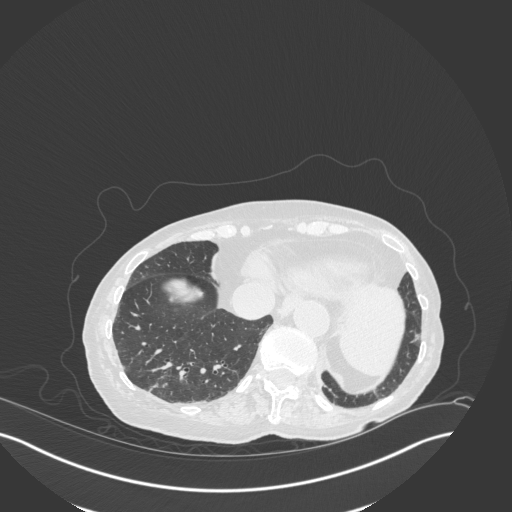
[im 619/885  lung]
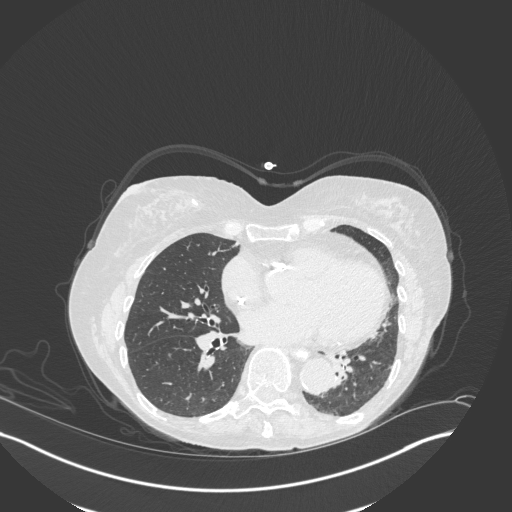
[im 708/885  lung]
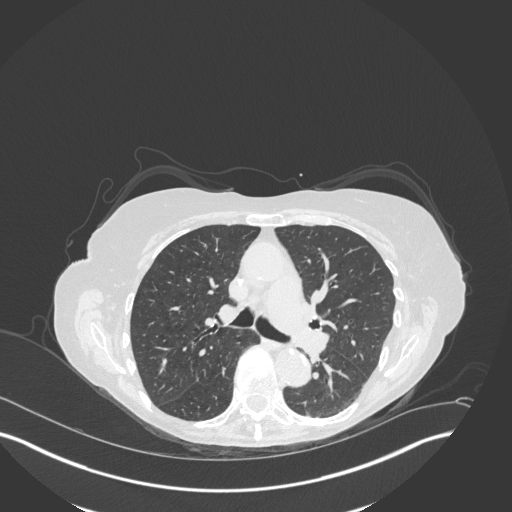
[im 796/885  mediastinal]
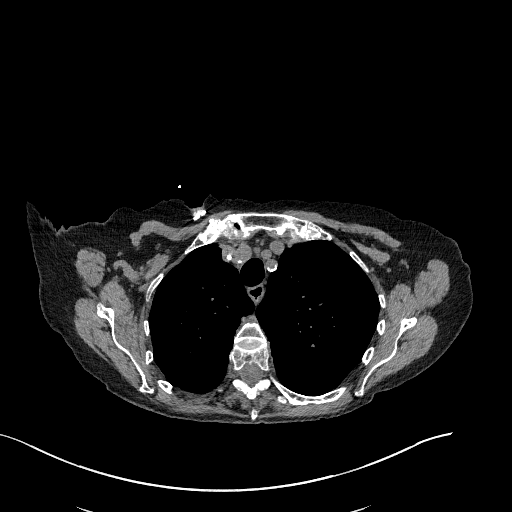
[im 796/885  lung]
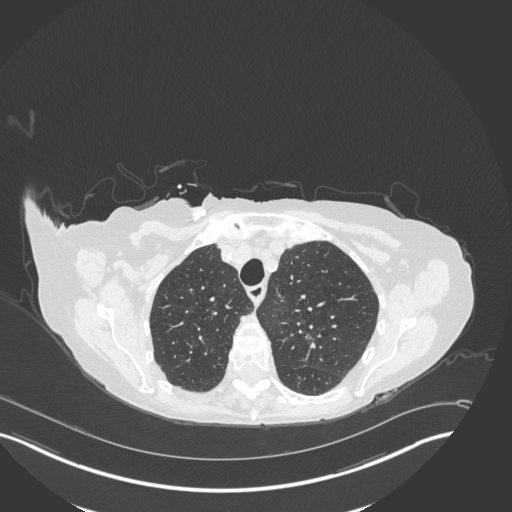

[Series 506: coronals · coronal · 0.70mm/px · 3 of 148 slices shown]
[im 30/148  lung]
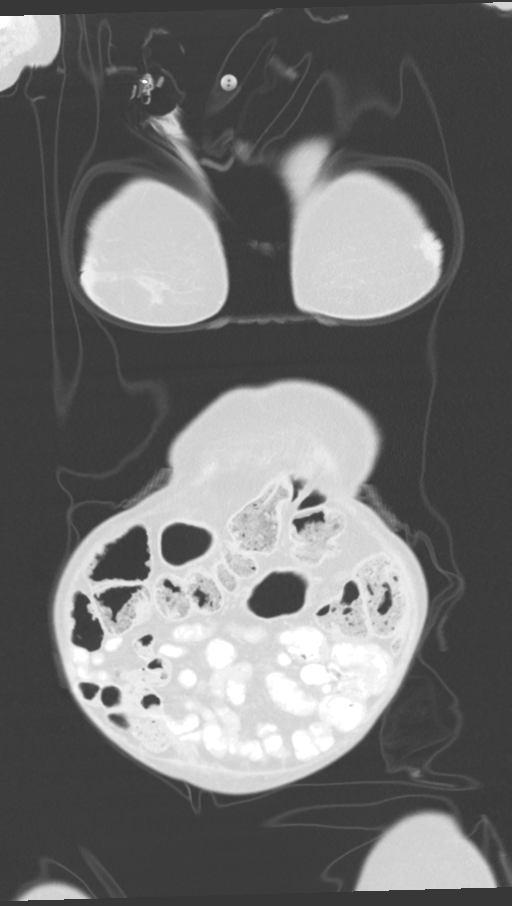
[im 59/148  lung]
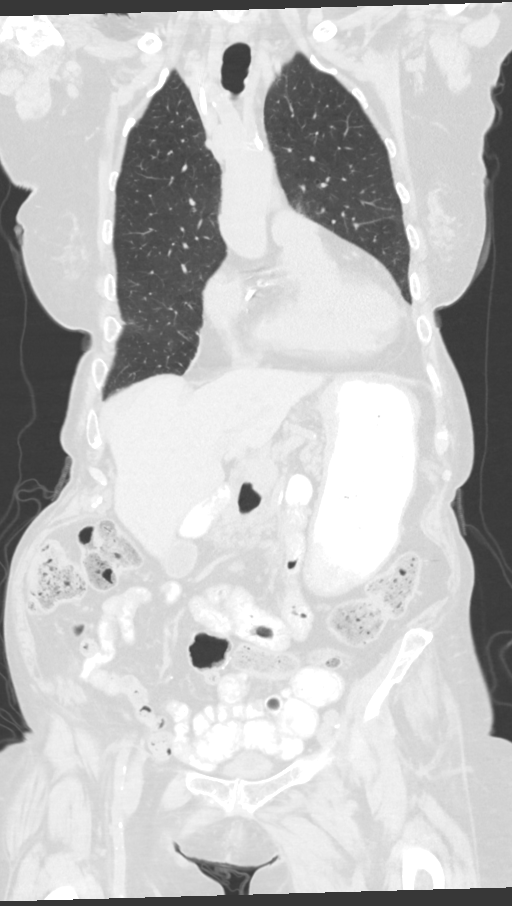
[im 89/148  lung]
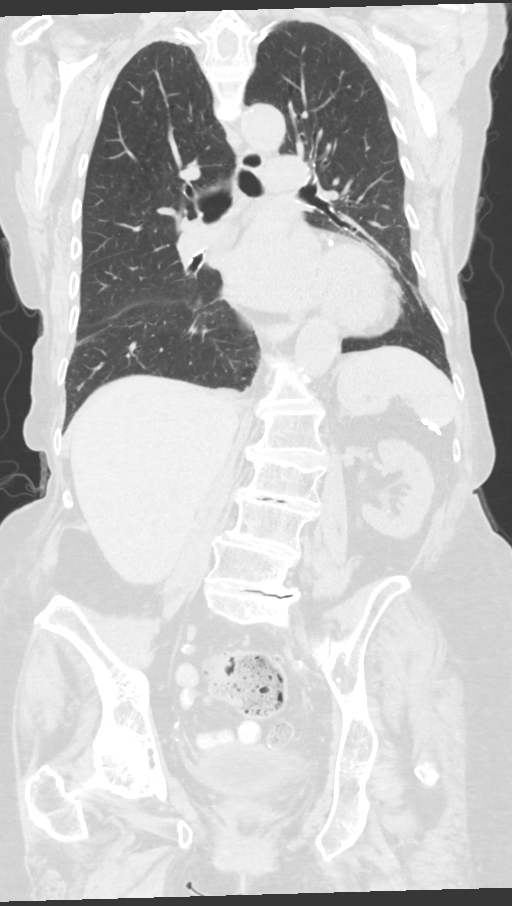

[12 of 36 positions shown; findings below may reference images not displayed]

FINDINGS: CT CHEST FINDINGS

Cardiovascular: Right Port-A-Cath tip: Cavoatrial junction.
Coronary, aortic arch, and branch vessel atherosclerotic vascular
disease. Mild to moderate cardiomegaly.

Mediastinum/Nodes: Unremarkable

Lungs/Pleura: Biapical pleuroparenchymal scarring. 7 by 4 mm left
upper lobe nodule on image 25 series 4, previously 9 by 4 mm by my
measurements. Continued peribronchovascular volume loss in the left
lower lobe. There is some scattered subpleural ground-glass
densities at the lung basis which are similar to the prior exam in
likely primarily from atelectasis or scarring.

The right upper lobe peribronchovascular nodularity previously
measured at 6 mm has essentially resolved.

The 5 mm ground-glass density right lower lobe nodule described on
the prior exam has essentially resolved.

There is some mildly increased subpleural nodularity in the right
lower lobe including a 4 mm subpleural nodule on image 126 of series
4 with some hazy adjacent scarring, and a 0.7 by 0.4 cm right lower
lobe subpleural nodule on image 131 series 4 which is new and along
a region of prior scarring. These are likely inflammatory but merit
surveillance.

Musculoskeletal: Healing right proximal humeral fracture with
residual deformity and continued visualization of much of the
fracture plane. Stable sharply defined sclerotic lesion in the T4
vertebral body, probably a bone island. Thoracic spondylosis.

CT ABDOMEN PELVIS FINDINGS

Hepatobiliary: Mildly contracted gallbladder. Otherwise
unremarkable.

Pancreas: Unremarkable

Spleen: Unremarkable

Adrenals/Urinary Tract: Right nephroureterectomy. Unchanged small
left adrenal adenoma. No significant urinary bladder abnormality.
Right renal contour normal.

Stomach/Bowel: Multiple duodenal diverticula. Postoperative findings
in the sigmoid colon. Prominent stool throughout the colon favors
constipation. No dilated bowel observed.

Vascular/Lymphatic: Atherosclerosis is present, including aortoiliac
atherosclerotic disease. There is atherosclerotic calcification
proximally in the superior mesenteric artery and proximally in the
left renal artery. No pathologic adenopathy observed.

Reproductive: Uterus absent.  Adnexa unremarkable.

Other: No supplemental non-categorized findings.

Musculoskeletal: Lax anterior abdominal wall musculature. Levoconvex
thoracolumbar scoliosis with rotary component. Degenerative hip
arthropathy bilaterally.
IMPRESSION: 1. Reduced size of the left upper lobe nodule, currently 7 by 4 mm.
Some of the previously defined small subpleural nodules have
resolved. There are other areas of mildly increased subpleural
nodularity in the right lower lobe which merit surveillance.
2. No pathologic adenopathy identified.
3. Other imaging findings of potential clinical significance: Aortic
Atherosclerosis ([id number]). Coronary atherosclerosis. Mild to
moderate cardiomegaly. Healing right proximal humeral fracture with
continued visualization of much of the dominant fracture plane.
Right nephroureterectomy. Small left adrenal adenoma. Multiple
duodenal diverticula. Prominent stool throughout the colon favors
constipation. Postoperative findings in the sigmoid colon. Lax
anterior abdominal wall musculature. Levoconvex thoracolumbar
scoliosis with rotary component. Degenerative hip arthropathy
bilaterally.

## 2022-09-27 DIAGNOSIS — C679 Malignant neoplasm of bladder, unspecified: Secondary | ICD-10-CM | POA: Diagnosis not present

## 2022-09-27 DIAGNOSIS — I129 Hypertensive chronic kidney disease with stage 1 through stage 4 chronic kidney disease, or unspecified chronic kidney disease: Secondary | ICD-10-CM | POA: Diagnosis not present

## 2022-09-27 DIAGNOSIS — N183 Chronic kidney disease, stage 3 unspecified: Secondary | ICD-10-CM | POA: Diagnosis not present

## 2022-10-12 DIAGNOSIS — G622 Polyneuropathy due to other toxic agents: Secondary | ICD-10-CM | POA: Diagnosis not present

## 2022-10-12 DIAGNOSIS — R008 Other abnormalities of heart beat: Secondary | ICD-10-CM | POA: Diagnosis not present

## 2022-10-12 DIAGNOSIS — Z6824 Body mass index (BMI) 24.0-24.9, adult: Secondary | ICD-10-CM | POA: Diagnosis not present

## 2022-10-12 DIAGNOSIS — Z139 Encounter for screening, unspecified: Secondary | ICD-10-CM | POA: Diagnosis not present

## 2022-10-20 DIAGNOSIS — H524 Presbyopia: Secondary | ICD-10-CM | POA: Diagnosis not present

## 2022-10-20 DIAGNOSIS — H353133 Nonexudative age-related macular degeneration, bilateral, advanced atrophic without subfoveal involvement: Secondary | ICD-10-CM | POA: Diagnosis not present

## 2022-10-21 DIAGNOSIS — H6523 Chronic serous otitis media, bilateral: Secondary | ICD-10-CM | POA: Diagnosis not present

## 2022-10-21 DIAGNOSIS — H906 Mixed conductive and sensorineural hearing loss, bilateral: Secondary | ICD-10-CM | POA: Diagnosis not present

## 2022-10-22 ENCOUNTER — Telehealth: Payer: Self-pay | Admitting: Cardiology

## 2022-10-22 NOTE — Telephone Encounter (Signed)
Patient wants provider switch to Dr. Bettina Gavia in [employer] office.

## 2022-10-28 DIAGNOSIS — N183 Chronic kidney disease, stage 3 unspecified: Secondary | ICD-10-CM | POA: Diagnosis not present

## 2022-10-28 DIAGNOSIS — C679 Malignant neoplasm of bladder, unspecified: Secondary | ICD-10-CM | POA: Diagnosis not present

## 2022-10-28 DIAGNOSIS — I129 Hypertensive chronic kidney disease with stage 1 through stage 4 chronic kidney disease, or unspecified chronic kidney disease: Secondary | ICD-10-CM | POA: Diagnosis not present

## 2022-11-01 ENCOUNTER — Other Ambulatory Visit: Payer: Self-pay | Admitting: Oncology

## 2022-11-08 ENCOUNTER — Encounter: Payer: Self-pay | Admitting: Cardiology

## 2022-11-08 ENCOUNTER — Encounter: Payer: Self-pay | Admitting: *Deleted

## 2022-11-26 DIAGNOSIS — C799 Secondary malignant neoplasm of unspecified site: Secondary | ICD-10-CM | POA: Diagnosis not present

## 2022-11-26 DIAGNOSIS — C679 Malignant neoplasm of bladder, unspecified: Secondary | ICD-10-CM | POA: Diagnosis not present

## 2022-12-08 ENCOUNTER — Other Ambulatory Visit: Payer: Self-pay | Admitting: Oncology

## 2022-12-08 DIAGNOSIS — C641 Malignant neoplasm of right kidney, except renal pelvis: Secondary | ICD-10-CM

## 2022-12-08 NOTE — Progress Notes (Signed)
[facility]  [address] [place],  [place]  [address] [phone]  Clinic Day:  12/09/2022  Referring physician: Abner Greenspan, MD   HISTORY OF PRESENT ILLNESS:  The patient is a 89 y.o. female  who I was asked to consult upon for the continued management/surveillance of her high-grade urothelial carcinoma.   She initially presented with right renal pelvis cancer diagnosed in 2021.  She underwent a robotic-assisted right nephroureterectomy and right adrenalectomy in August 2021.  This was followed by 4 cycles of adjuvant carboplatin/gemcitabine, which were completed in December 2021.  As disease recurrence was eventually found, she was placed on nivolumab in September 2022.  She completed 7 cycles of nivolumab, with the last cycle being given in late March 2023.  Scans done which showed a near complete response to therapy.  Due to her age and other comorbidities, her nivolumab was discontinued.  Fortunately, since then, follow-up scans have not shown any obvious evidence of disease recurrence.  PAST MEDICAL HISTORY:   Past Medical History:  Diagnosis Date   Aortic atherosclerosis (HCC)    Benign hypertension with chronic kidney disease, stage III (HCC)    Bladder cancer (HCC)    Blood clot in vein 40 yrs ago   left   Bundle branch block    left   Cancer of kidney (HCC)    right kidney 1  area cancer last chemo Sep 18 2020   Closed wedge compression fracture of T2 vertebra (HCC) 07/09/2019   Essential hypertension 04/30/2020   History of kidney stones    Hypercholesteremia    Hypertriglyceridemia 04/30/2020   Hypothyroid    IBS (irritable bowel syndrome)    Kidney stone    Left atrial enlargement 04/30/2020   Left bundle branch block 04/30/2020   Leg edema, left    some swelling goes down after sleeping saew pcp and gabapentin increased   Malignant neoplasm of kidney excluding renal pelvis (HCC) 06/27/2020   Mass of adrenal gland (HCC)     Metastatic adenocarcinoma (HCC)    Mixed hyperlipidemia    Neuropathy    toes and x 2 in hands   Osteoporosis    Skin abnormality    sore quarter size x 3 weeks as of 11-03-2020 healing open to air with rare bloody drainage   Urothelial carcinoma of bladder (HCC)    Varicose veins of both lower extremities    Wears glasses    Wears partial dentures    upper and lower    PAST SURGICAL HISTORY:   Past Surgical History:  Procedure Laterality Date   ABDOMINAL HYSTERECTOMY  40 yrs ago   complete   APPENDECTOMY  40 yrs ago   done with hysterectomy   BLADDER SURGERY  01/2017   removal of a tumor   CATARACT EXTRACTION Bilateral 05/12/2009   COLON SURGERY  18 yrs ago   took out 12 inches   COLONOSCOPY  11/19/2009   Mild pancolonic diverticulitis. Status post sigmoid resection. Small internal hemorrhoid. Otherwies normal colonsocopy.    CYSTOSCOPY WITH BIOPSY N/A 11/05/2020   Procedure: CYSTOSCOPY WITH BLADDER BIOPSY/ FULGURATION;  Surgeon: Rene Paci, MD;  Location: [facility];  Service: Urology;  Laterality: N/A;   CYSTOSCOPY WITH URETEROSCOPY AND STENT PLACEMENT Right 01/30/2020   Procedure: CYSTOSCOPY WITH RIGHT URETEROSCOPY/BIOPSY;  Surgeon: Rene Paci, MD;  Location: [facility];  Service: Urology;  Laterality: Right;   EYE SURGERY Bilateral 1990   Implant  IR IMAGING GUIDED PORT INSERTION  07/07/2020   IR PATIENT EVAL TECH 0-60 MINS  03/17/2022   IR PATIENT EVAL TECH 0-60 MINS  03/22/2022   IR PATIENT EVAL TECH 0-60 MINS  03/29/2022   IR PATIENT EVAL TECH 0-60 MINS  04/05/2022   IR REMOVAL TUN ACCESS W/ PORT W/O FL MOD SED  03/15/2022   KIDNEY STONE SURGERY Right 06/21/2019   KNEE SURGERY Right 2012   Knee fracture, Dr. Chryl Heck @ [facility]   ROBOT ASSITED LAPAROSCOPIC NEPHROURETERECTOMY Right 05/21/2020   Procedure: XI ROBOT ASSITED LAPAROSCOPIC NEPHROURETERECTOMY,ADRENALECTOMY;  Surgeon: Rene Paci, MD;  Location: [facility];  Service: Urology;  Laterality: Right;   Tummy Tuck  yrs ago   Vericose Vein Stripping  yrs ago   left leg    CURRENT MEDICATIONS:   Current Outpatient Medications  Medication Sig Dispense Refill   Probiotic Product (BACID PO) Take by mouth.     amLODipine (NORVASC) 10 MG tablet Take 10 mg by mouth daily.     BioGaia Probiotic (BIOGAIA/GERBER SOOTHE) LIQD Take 5 drops by mouth daily at 8 pm.     fenofibrate 160 MG tablet Take 160 mg by mouth at bedtime.     levothyroxine (SYNTHROID) 112 MCG tablet Take 112 mcg by mouth daily.     Multiple Vitamin (MULTIVITAMIN) capsule Take 1 capsule by mouth daily.     Multiple Vitamins-Minerals (PRESERVISION AREDS PO) Take 1 capsule by mouth daily.     pregabalin (LYRICA) 50 MG capsule Take 50 mg by mouth 2 (two) times daily.     No current facility-administered medications for this visit.    ALLERGIES:   Allergies  Allergen Reactions   Flomax [Tamsulosin Hcl] Nausea Only    FAMILY HISTORY:   Family History  Problem Relation Age of Onset   Osteoporosis Mother    Colon cancer Neg Hx    Esophageal cancer Neg Hx     SOCIAL HISTORY:   reports that she quit smoking about 28 years ago. Her smoking use included cigarettes. She has a 40.00 pack-year smoking history. She has never used smokeless tobacco. She reports that she does not currently use alcohol. She reports that she does not use drugs.  REVIEW OF SYSTEMS:  Review of Systems - Oncology   PHYSICAL EXAM:  Blood pressure (!) 167/72, pulse 61, temperature (!) 97.4 F (36.3 C), resp. rate 14, height 5\' 5"  (1.651 m), weight 147 lb 9.6 oz (67 kg), SpO2 98 %. Wt Readings from Last 3 Encounters:  01/11/23 146 lb 3.2 oz (66.3 kg)  12/09/22 147 lb (66.7 kg)  12/09/22 147 lb 9.6 oz (67 kg)   Body mass index is 24.56 kg/m. Performance status (ECOG): 1 - Symptomatic but completely ambulatory Physical Exam Constitutional:      Appearance: Normal appearance.  She is not ill-appearing.  HENT:     Mouth/Throat:     Mouth: Mucous membranes are moist.     Pharynx: Oropharynx is clear. No oropharyngeal exudate or posterior oropharyngeal erythema.  Cardiovascular:     Rate and Rhythm: Normal rate and regular rhythm.     Heart sounds: No murmur heard.    No friction rub. No gallop.  Pulmonary:     Effort: Pulmonary effort is normal. No respiratory distress.     Breath sounds: Normal breath sounds. No wheezing, rhonchi or rales.  Abdominal:     General: Bowel sounds are normal. There is no distension.     Palpations: Abdomen is  soft. There is no mass.     Tenderness: There is no abdominal tenderness.  Musculoskeletal:        General: No swelling.     Right lower leg: No edema.     Left lower leg: No edema.  Lymphadenopathy:     Cervical: No cervical adenopathy.     Upper Body:     Right upper body: No supraclavicular or axillary adenopathy.     Left upper body: No supraclavicular or axillary adenopathy.     Lower Body: No right inguinal adenopathy. No left inguinal adenopathy.  Skin:    General: Skin is warm.     Coloration: Skin is not jaundiced.     Findings: No lesion or rash.  Neurological:     General: No focal deficit present.     Mental Status: She is alert and oriented to person, place, and time. Mental status is at baseline.  Psychiatric:        Mood and Affect: Mood normal.        Behavior: Behavior normal.        Thought Content: Thought content normal.     LABS:      Latest Ref Rng & Units 01/10/2023   12:00 AM 12/09/2022   12:00 AM 09/09/2022   10:08 AM  CBC  WBC  3.1     4.2     3.9   Hemoglobin 12.0 - 16.0 12.1     12.5     12.3   Hematocrit 36 - 46 37     38     37.9   Platelets 150 - 400 K/uL 176     171     252      This result is from an external source.      Latest Ref Rng & Units 01/10/2023   12:00 AM 12/09/2022    1:20 PM 09/09/2022   10:08 AM  CMP  Glucose 70 - 99 mg/dL  91  87   BUN 4 - 21 28      30  27    Creatinine 0.5 - 1.1 1.1     1.11  1.15   Sodium 137 - 147 137     143  141   Potassium 3.5 - 5.1 mEq/L 4.0     4.4  4.5   Chloride 99 - 108 100     102  103   CO2 13 - 22 32     30  35   Calcium 8.7 - 10.7 10.1     10.5  10.8   Total Protein 6.5 - 8.1 g/dL  7.1  6.4   Total Bilirubin 0.3 - 1.2 mg/dL  0.5  0.5   Alkaline Phos 25 - 125 82     73  67   AST 13 - 35 37     28  23   ALT 7 - 35 U/L 27     25  18       This result is from an external source.     No results found for: "CEA1", "CEA" / No results found for: "CEA1", "CEA" No results found for: "PSA1" No results found for: "ZOX096" No results found for: "CAN125"  No results found for: "TOTALPROTELP", "ALBUMINELP", "A1GS", "A2GS", "BETS", "BETA2SER", "GAMS", "MSPIKE", "SPEI" No results found for: "TIBC", "FERRITIN", "IRONPCTSAT" Lab Results  Component Value Date   LDH 139 12/09/2022       Component Value Date/Time   LDH 139 12/09/2022  1320    Review Flowsheet       Latest Ref Rng & Units 12/09/2022  Oncology Labs  LDH 98 - 192 U/L 139     STUDIES:  No results found.   ASSESSMENT & PLAN:  A 89 y.o. female who I was asked to consult upon for  .The patient understands all the plans discussed today and is in agreement with them.  I do appreciate Abner Greenspan, MD for his new consult.   Karesa Maultsby Kirby Funk, MD

## 2022-12-08 NOTE — Progress Notes (Unsigned)
Cardiology Office Note:    Date:  12/09/2022   ID:  Kayla Bowers, DOB 1929-03-23, MRN [id number]  PCP:  [name], MD  Cardiologist:  Shirlee More, MD    Referring MD: [name], MD    ASSESSMENT:    1. Bradycardia   2. Left bundle branch block   3. Essential hypertension   4. Bilateral lower extremity edema    PLAN:    In order of problems listed above:  There was concern she had significant bradycardia her monitor does not show evidence of high degree heart block with pauses or sustained bradycardia and I told the family I am comfortable saying that she does not require consideration of pacemaker and implanted loop recorder.  I did ask him to purchase a pulse meter to spot check her at home and to let her physician know if she consistently had rates less than 50 bpm.  We should also avoid rate slowing medications Stable pattern first-degree AV block left bundle branch block Blood pressure is well-controlled on high-dose amlodipine but she has developed very bothersome lower extremity edema the family is unsure of other antihypertensive agents she is taken [place] discussed with her PCP alternate treatment such as ARB with or without thiazide diuretic to avoid this bothersome side effect of her calcium channel blocker I will plan to see back in my office as needed. Overall overall frequency of supraventricular and ventricular arrhythmia was low she has no atrial fibrillation and I do not think she requires any consideration beta-blocker or an antiarrhythmic drug.   Next appointment: I will see back in my office as needed   Medication Adjustments/Labs and Tests Ordered: Current medicines are reviewed at length with the patient today.  Concerns regarding medicines are outlined above.  Orders Placed This Encounter  Procedures   EKG 12-Lead   No orders of the defined types were placed in this encounter.   Chief Complaint  Patient presents with   PVC's     History of Present Illness:    Kayla Bowers is a 89 y.o. female with a hx of hypertension hyperlipidemia and left bundle branch block last seen 04/30/2020 and preoperative evaluation.  She is referred back to the office by her primary care physician following office visit 10/21/2022 for ventricular arrhythmia  She had an event monitor performed at her primary care physician's office for 8 days ending 08/27/2022 there were no symptomatic events.  Ventricular ectopy was rare supraventricular ectopy was rare.  No pauses of 3 seconds or greater no episodes of second or third-degree AV block.  She had 4 episodes of ventricular tachycardia noted however there was a great deal of artifact present.  There were 7 episodes of SVT longest 10 complexes rate of 135 bpm there were no episodes of atrial fibrillation or flutter.  First-degree AV block and bundle branch block were noted.  Compliance with diet, lifestyle and medications: Yes She is a very nice caring concerned family She was in physical therapy and the physical therapist thought her heart rate was slow called her PCP and had her event monitor.   She has not had syncope palpitation or known arrhythmia No history of heart disease congenital rheumatic or atrial fibrillation She has developed edema taking high-dose amlodipine previously was on hydrochlorothiazide Past Medical History:  Diagnosis Date   Aortic atherosclerosis ([place])    Benign hypertension with chronic kidney disease, stage III (HCC)    Bladder cancer ([place])    Blood clot in  vein 40 yrs ago   left   Bundle branch block    left   Cancer of kidney ([place])    right kidney 1  area cancer last chemo Sep 18 2020   Closed wedge compression fracture of T2 vertebra ([place]) 07/09/2019   Essential hypertension 04/30/2020   History of kidney stones    Hypercholesteremia    Hypertriglyceridemia 04/30/2020   Hypothyroid    IBS (irritable bowel syndrome)    Kidney stone    Left  atrial enlargement 04/30/2020   Left bundle branch block 04/30/2020   Leg edema, left    some swelling goes down after sleeping saew pcp and gabapentin increased   Malignant neoplasm of kidney excluding renal pelvis ([place]) 06/27/2020   Mass of adrenal gland ([place])    Metastatic adenocarcinoma (HCC)    Mixed hyperlipidemia    Neuropathy    toes and x 2 in hands   Osteoporosis    Skin abnormality    sore quarter size x 3 weeks as of 11-03-2020 healing open to air with rare bloody drainage   Urothelial carcinoma of bladder ([place])    Varicose veins of both lower extremities    Wears glasses    Wears partial dentures    upper and lower    Past Surgical History:  Procedure Laterality Date   ABDOMINAL HYSTERECTOMY  40 yrs ago   complete   APPENDECTOMY  40 yrs ago   done with hysterectomy   BLADDER SURGERY  01/2017   removal of a tumor   CATARACT EXTRACTION Bilateral 05/12/2009   COLON SURGERY  18 yrs ago   took out 12 inches   COLONOSCOPY  11/19/2009   Mild pancolonic diverticulitis. Status post sigmoid resection. Small internal hemorrhoid. Otherwies normal colonsocopy.    CYSTOSCOPY WITH BIOPSY N/A 11/05/2020   Procedure: CYSTOSCOPY WITH BLADDER BIOPSY/ FULGURATION;  Surgeon: Ceasar Mons, MD;  Location[facility];  Service: Urology;  Laterality: N/A;   CYSTOSCOPY WITH URETEROSCOPY AND STENT PLACEMENT Right 01/30/2020   Procedure: CYSTOSCOPY WITH RIGHT URETEROSCOPY/BIOPSY;  Surgeon: Ceasar Mons, MD;  Location: [facility];  Service: Urology;  Laterality: Right;   EYE SURGERY Bilateral 1990   Implant   IR IMAGING GUIDED PORT INSERTION  07/07/2020   IR PATIENT EVAL TECH 0-60 MINS  03/17/2022   IR PATIENT EVAL TECH 0-60 MINS  03/22/2022   IR PATIENT EVAL TECH 0-60 MINS  03/29/2022   IR PATIENT EVAL TECH 0-60 MINS  04/05/2022   IR REMOVAL TUN ACCESS W/ PORT W/O FL MOD SED  03/15/2022   KIDNEY STONE SURGERY Right 06/21/2019    KNEE SURGERY Right 2012   Knee fracture, Dr. Norberto Sorenson @ [place] NEPHROURETERECTOMY Right 05/21/2020   Procedure: XI ROBOT ASSITED LAPAROSCOPIC [place];  Surgeon: Ceasar Mons, MD;  Location: [facility];  Service: Urology;  Laterality: Right;   Tummy Tuck  yrs ago   Vericose Vein Stripping  yrs ago   left leg    Current Medications: Current Meds  Medication Sig   amLODipine (NORVASC) 10 MG tablet Take 10 mg by mouth daily.   BioGaia Probiotic (BIOGAIA/GERBER SOOTHE) LIQD Take 5 drops by mouth daily at 8 pm.   fenofibrate 160 MG tablet Take 160 mg by mouth at bedtime.   levothyroxine (SYNTHROID) 112 MCG tablet Take 112 mcg by mouth daily.   Multiple Vitamin (MULTIVITAMIN) capsule Take 1 capsule by mouth daily.   Multiple Vitamins-Minerals (  PRESERVISION AREDS PO) Take 1 capsule by mouth daily.   pregabalin (LYRICA) 50 MG capsule Take 50 mg by mouth 2 (two) times daily.   Probiotic Product (BACID PO) Take by mouth.     Allergies:   Flomax [tamsulosin hcl]   Social History   Socioeconomic History   Marital status: Married    Spouse name: Not on file   Number of children: 2   Years of education: Not on file   Highest education level: Not on file  Occupational History   Not on file  Tobacco Use   Smoking status: Former    Packs/day: 1.00    Years: 40.00    Additional pack years: 0.00    Total pack years: 40.00    Types: Cigarettes    Quit date: 29    Years since quitting: 28.2   Smokeless tobacco: Never   Tobacco comments:    quit age 68  Vaping Use   Vaping Use: Never used  Substance and Sexual Activity   Alcohol use: Not Currently    Comment: quit 2 years ago    Drug use: Never   Sexual activity: Not Currently    Birth control/protection: Post-menopausal  Other Topics Concern   Not on file  Social History Narrative   Not on file   Social Determinants of Health   Financial Resource Strain:  Not on file  Food Insecurity: Not on file  Transportation Needs: Not on file  Physical Activity: Not on file  Stress: Not on file  Social Connections: Not on file     Family History: The patient's 1 family history includes Osteoporosis in her mother. There is no history of Colon cancer or Esophageal cancer. ROS:   Please see the history of present illness.    All other systems reviewed and are negative.  EKGs/Labs/Other Studies Reviewed:    The following studies were reviewed today:      EKG:  EKG ordered today and personally reviewed.  The ekg ordered today demonstrates sinus rhythm first-degree AV block left bundle branch block  Recent Labs: 09/09/2022: ALT 18; BUN 27; Creatinine 1.15; Potassium 4.5; Sodium 141; TSH 0.772 12/09/2022: Hemoglobin 12.5; Platelets 171  Recent Lipid Panel No results found for: "CHOL", "TRIG", "HDL", "CHOLHDL", "VLDL", "LDLCALC", "LDLDIRECT"  Physical Exam:    VS:  BP 120/60 (BP Location: Right Arm, Patient Position: Sitting, Cuff Size: Normal)   Pulse (!) 59   Ht '5\' 5"'$  (1.651 m)   Wt 147 lb (66.7 kg)   SpO2 98%   BMI 24.46 kg/m     Wt Readings from Last 3 Encounters:  12/09/22 147 lb (66.7 kg)  12/09/22 147 lb 9.6 oz (67 kg)  10/12/22 149 lb (67.6 kg)     GEN:  Well nourished, well developed in no acute distress HEENT: Normal NECK: No JVD; No carotid bruits LYMPHATICS: No lymphadenopathy CARDIAC: Paradoxical second heart sound RRR, no murmurs, rubs, gallops RESPIRATORY:  Clear to auscultation without rales, wheezing or rhonchi  ABDOMEN: Soft, non-tender, non-distended MUSCULOSKELETAL: Normal lower extremity pitting calcium channel blocker marked bilateral edema with stasis changes; No deformity  SKIN: Warm and dry NEUROLOGIC:  Alert and oriented x 3 PSYCHIATRIC:  Normal affect    Signed, Shirlee More, MD  12/09/2022 4:40 PM    [facility]

## 2022-12-09 ENCOUNTER — Ambulatory Visit: Payer: Medicare PPO | Attending: Cardiology | Admitting: Cardiology

## 2022-12-09 ENCOUNTER — Encounter: Payer: Self-pay | Admitting: Cardiology

## 2022-12-09 ENCOUNTER — Inpatient Hospital Stay: Payer: Medicare PPO

## 2022-12-09 ENCOUNTER — Inpatient Hospital Stay: Payer: Medicare PPO | Attending: Oncology | Admitting: Oncology

## 2022-12-09 VITALS — BP 167/72 | HR 61 | Temp 97.4°F | Resp 14 | Ht 65.0 in | Wt 147.6 lb

## 2022-12-09 VITALS — BP 120/60 | HR 59 | Ht 65.0 in | Wt 147.0 lb

## 2022-12-09 DIAGNOSIS — R600 Localized edema: Secondary | ICD-10-CM | POA: Diagnosis not present

## 2022-12-09 DIAGNOSIS — Z538 Procedure and treatment not carried out for other reasons: Secondary | ICD-10-CM

## 2022-12-09 DIAGNOSIS — Z9221 Personal history of antineoplastic chemotherapy: Secondary | ICD-10-CM | POA: Insufficient documentation

## 2022-12-09 DIAGNOSIS — I10 Essential (primary) hypertension: Secondary | ICD-10-CM | POA: Diagnosis not present

## 2022-12-09 DIAGNOSIS — Z8589 Personal history of malignant neoplasm of other organs and systems: Secondary | ICD-10-CM | POA: Diagnosis not present

## 2022-12-09 DIAGNOSIS — D649 Anemia, unspecified: Secondary | ICD-10-CM | POA: Diagnosis not present

## 2022-12-09 DIAGNOSIS — C641 Malignant neoplasm of right kidney, except renal pelvis: Secondary | ICD-10-CM | POA: Diagnosis not present

## 2022-12-09 DIAGNOSIS — I447 Left bundle-branch block, unspecified: Secondary | ICD-10-CM

## 2022-12-09 DIAGNOSIS — R001 Bradycardia, unspecified: Secondary | ICD-10-CM | POA: Diagnosis not present

## 2022-12-09 LAB — CMP (CANCER CENTER ONLY)
ALT: 25 U/L (ref 0–44)
AST: 28 U/L (ref 15–41)
Albumin: 4.2 g/dL (ref 3.5–5.0)
Alkaline Phosphatase: 73 U/L (ref 38–126)
Anion gap: 11 (ref 5–15)
BUN: 30 mg/dL — ABNORMAL HIGH (ref 8–23)
CO2: 30 mmol/L (ref 22–32)
Calcium: 10.5 mg/dL — ABNORMAL HIGH (ref 8.9–10.3)
Chloride: 102 mmol/L (ref 98–111)
Creatinine: 1.11 mg/dL — ABNORMAL HIGH (ref 0.44–1.00)
GFR, Estimated: 47 mL/min — ABNORMAL LOW (ref >60)
Glucose, Bld: 91 mg/dL (ref 70–99)
Potassium: 4.4 mmol/L (ref 3.5–5.1)
Sodium: 143 mmol/L (ref 135–145)
Total Bilirubin: 0.5 mg/dL (ref 0.3–1.2)
Total Protein: 7.1 g/dL (ref 6.5–8.1)

## 2022-12-09 LAB — CBC AND DIFFERENTIAL
HCT: 38 (ref 36–46)
Hemoglobin: 12.5 (ref 12.0–16.0)
Neutrophils Absolute: 2.44
Platelets: 171 10*3/uL (ref 150–400)
WBC: 4.2

## 2022-12-09 LAB — CBC: RBC: 4.23 (ref 3.87–5.11)

## 2022-12-09 LAB — LACTATE DEHYDROGENASE: LDH: 139 U/L (ref 98–192)

## 2022-12-09 NOTE — Patient Instructions (Signed)
Medication Instructions:  Your physician recommends that you continue on your current medications as directed. Please refer to the Current Medication list given to you today.  *If you need a refill on your cardiac medications before your next appointment, please call your pharmacy*   Lab Work: None If you have labs (blood work) drawn today and your tests are completely normal, you will receive your results only by: MyChart Message (if you have MyChart) OR A paper copy in the mail If you have any lab test that is abnormal or we need to change your treatment, we will call you to review the results.   Testing/Procedures: None   Follow-Up: At [facility], you and your health needs are our priority.  As part of our continuing mission to provide you with exceptional heart care, we have created designated Provider Care Teams.  These Care Teams include your primary Cardiologist (physician) and Advanced Practice Providers (APPs -  Physician Assistants and Nurse Practitioners) who all work together to provide you with the care you need, when you need it.  We recommend signing up for the patient portal called "MyChart".  Sign up information is provided on this After Visit Summary.  MyChart is used to connect with patients for Virtual Visits (Telemedicine).  Patients are able to view lab/test results, encounter notes, upcoming appointments, etc.  Non-urgent messages can be sent to your provider as well.   To learn more about what you can do with MyChart, go to [email or web address].    Your next appointment:   Follow up as needed  Provider:   Brian Munley, MD    Other Instructions None  

## 2022-12-13 DIAGNOSIS — N183 Chronic kidney disease, stage 3 unspecified: Secondary | ICD-10-CM | POA: Diagnosis not present

## 2022-12-13 DIAGNOSIS — I129 Hypertensive chronic kidney disease with stage 1 through stage 4 chronic kidney disease, or unspecified chronic kidney disease: Secondary | ICD-10-CM | POA: Diagnosis not present

## 2022-12-13 DIAGNOSIS — C679 Malignant neoplasm of bladder, unspecified: Secondary | ICD-10-CM | POA: Diagnosis not present

## 2022-12-13 DIAGNOSIS — Z6824 Body mass index (BMI) 24.0-24.9, adult: Secondary | ICD-10-CM | POA: Diagnosis not present

## 2022-12-13 DIAGNOSIS — R609 Edema, unspecified: Secondary | ICD-10-CM | POA: Diagnosis not present

## 2022-12-17 ENCOUNTER — Encounter: Payer: Self-pay | Admitting: Oncology

## 2022-12-20 ENCOUNTER — Telehealth: Payer: Self-pay | Admitting: Oncology

## 2022-12-20 NOTE — Telephone Encounter (Signed)
12/20/22 Spoke with patient and scheduled  ct scans and f/u visit.Patient confirmed all appts

## 2022-12-27 DIAGNOSIS — C799 Secondary malignant neoplasm of unspecified site: Secondary | ICD-10-CM | POA: Diagnosis not present

## 2022-12-27 DIAGNOSIS — C679 Malignant neoplasm of bladder, unspecified: Secondary | ICD-10-CM | POA: Diagnosis not present

## 2022-12-28 DIAGNOSIS — H906 Mixed conductive and sensorineural hearing loss, bilateral: Secondary | ICD-10-CM | POA: Diagnosis not present

## 2022-12-28 DIAGNOSIS — H6523 Chronic serous otitis media, bilateral: Secondary | ICD-10-CM | POA: Diagnosis not present

## 2023-01-10 DIAGNOSIS — C649 Malignant neoplasm of unspecified kidney, except renal pelvis: Secondary | ICD-10-CM | POA: Diagnosis not present

## 2023-01-10 DIAGNOSIS — D649 Anemia, unspecified: Secondary | ICD-10-CM | POA: Diagnosis not present

## 2023-01-10 DIAGNOSIS — C641 Malignant neoplasm of right kidney, except renal pelvis: Secondary | ICD-10-CM | POA: Diagnosis not present

## 2023-01-10 LAB — COMPREHENSIVE METABOLIC PANEL
Albumin: 4 (ref 3.5–5.0)
Calcium: 10.1 (ref 8.7–10.7)

## 2023-01-10 LAB — CBC AND DIFFERENTIAL
HCT: 37 (ref 36–46)
Hemoglobin: 12.1 (ref 12.0–16.0)
Neutrophils Absolute: 1.49
Platelets: 176 10*3/uL (ref 150–400)
WBC: 3.1

## 2023-01-10 LAB — CBC: RBC: 4.12 (ref 3.87–5.11)

## 2023-01-10 LAB — HEPATIC FUNCTION PANEL
ALT: 27 U/L (ref 7–35)
AST: 37 — AB (ref 13–35)
Alkaline Phosphatase: 82 (ref 25–125)
Bilirubin, Total: 0.4

## 2023-01-10 LAB — BASIC METABOLIC PANEL
BUN: 28 — AB (ref 4–21)
CO2: 32 — AB (ref 13–22)
Chloride: 100 (ref 99–108)
Creatinine: 1.1 (ref 0.5–1.1)
Glucose: 87
Potassium: 4 mEq/L (ref 3.5–5.1)
Sodium: 137 (ref 137–147)

## 2023-01-11 ENCOUNTER — Inpatient Hospital Stay: Payer: Medicare PPO | Attending: Oncology | Admitting: Oncology

## 2023-01-11 ENCOUNTER — Telehealth: Payer: Self-pay

## 2023-01-11 ENCOUNTER — Other Ambulatory Visit: Payer: Self-pay | Admitting: Oncology

## 2023-01-11 VITALS — BP 147/66 | HR 52 | Resp 14 | Ht 65.0 in | Wt 146.2 lb

## 2023-01-11 DIAGNOSIS — C641 Malignant neoplasm of right kidney, except renal pelvis: Secondary | ICD-10-CM

## 2023-01-11 LAB — TSH: TSH: 0.03 — AB (ref 0.41–5.90)

## 2023-01-11 NOTE — Progress Notes (Signed)
[facility]  [address] [place],  [place]  [address] [phone]  Clinic Day:  01/11/2023  Referring physician: Abner Greenspan, MD   HISTORY OF PRESENT ILLNESS:  The patient is a 89 y.o. 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  She comes in today to go over her CT scans ascertain her new disease baseline.  Since her last visit, the patient has been doing fairly well.  She denies having abdominal or back pain or any urinary symptoms which concern her for overt signs of disease recurrence/progression.   She initially presented with right renal pelvis cancer diagnosed in 2021.  She underwent a robotic-assisted right nephroureterectomy and right adrenalectomy in August 2021.  This was followed by 4 cycles of adjuvant carboplatin/gemcitabine, which were completed in December 2021.  As disease recurrence was eventually found, she was placed on nivolumab in September 2022.  She completed 7 cycles of nivolumab, with the last cycle being given in late March 2023.  Scans done which showed a near complete response to therapy.  Due to her age and other comorbidities, her nivolumab was discontinued.  Fortunately, since then, follow-up scans have not shown any obvious evidence of disease recurrence.   PHYSICAL EXAM:  Blood pressure (!) 147/66, pulse (!) 52, resp. rate 14, height 5\' 5"  (1.651 m), weight 146 lb 3.2 oz (66.3 kg), SpO2 98 %. Wt Readings from Last 3 Encounters:  01/11/23 146 lb 3.2 oz (66.3 kg)  12/09/22 147 lb (66.7 kg)  12/09/22 147 lb 9.6 oz (67 kg)   Body mass index is 24.33 kg/m. Performance status (ECOG): 2 - Symptomatic, <50% confined to bed Physical Exam Constitutional:      Appearance: Normal appearance. She is not ill-appearing.     Comments: She ambulates with a cane.  HENT:     Mouth/Throat:     Mouth: Mucous membranes are moist.     Pharynx: Oropharynx is clear. No oropharyngeal exudate or  posterior oropharyngeal erythema.  Cardiovascular:     Rate and Rhythm: Normal rate and regular rhythm.     Heart sounds: No murmur heard.    No friction rub. No gallop.  Pulmonary:     Effort: Pulmonary effort is normal. No respiratory distress.     Breath sounds: Normal breath sounds. No wheezing, rhonchi or rales.  Abdominal:     General: Bowel sounds are normal. There is no distension.     Palpations: Abdomen is soft. There is no mass.     Tenderness: There is no abdominal tenderness.  Musculoskeletal:        General: No swelling.     Right lower leg: No edema.     Left lower leg: No edema.  Lymphadenopathy:     Cervical: No cervical adenopathy.     Upper Body:     Right upper body: No supraclavicular or axillary adenopathy.     Left upper body: No supraclavicular or axillary adenopathy.     Lower Body: No right inguinal adenopathy. No left inguinal adenopathy.  Skin:    General: Skin is warm.     Coloration: Skin is not jaundiced.     Findings: No lesion or rash.  Neurological:     General: No focal deficit present.     Mental Status: She is alert and oriented to person, place, and time. Mental status is at baseline.  Psychiatric:        Mood and Affect: Mood normal.  Behavior: Behavior normal.        Thought Content: Thought content normal.    LABS:      Latest Ref Rng & Units 01/10/2023   12:00 AM 12/09/2022   12:00 AM 09/09/2022   10:08 AM  CBC  WBC  3.1     4.2     3.9   Hemoglobin 12.0 - 16.0 12.1     12.5     12.3   Hematocrit 36 - 46 37     38     37.9   Platelets 150 - 400 K/uL 176     171     252      This result is from an external source.      Latest Ref Rng & Units 01/10/2023   12:00 AM 12/09/2022    1:20 PM 09/09/2022   10:08 AM  CMP  Glucose 70 - 99 mg/dL  91  87   BUN 4 - Creatinine 0.5 - 1.1 1.1     1.11  1.15   Sodium 137 - 147 137     143  141   Potassium 3.5 - 5.1 mEq/L 4.0     4.4  4.5   Chloride 99 - 108 100      102  103   CO2 13 - 22 32     30  35   Calcium 8.7 - 10.7 10.1     10.5  10.8   Total Protein 6.5 - 8.1 g/dL  7.1  6.4   Total Bilirubin 0.3 - 1.2 mg/dL  0.5  0.5   Alkaline Phos 25 - 125 82     73  67   AST 13 - 35 37     28  23   ALT 7 - 35 U/L This result is from an external source.   Lab Results  Component Value Date   LDH 139 12/09/2022   STUDIES: CT scans of her abdomen/pelvis revealed the following:  FINDINGS: CT CHEST FINDINGS  Cardiovascular: No acute vascular findings. There is atherosclerosis of the aorta, great vessels and coronary arteries. Stable cardiomegaly without significant pericardial fluid.  Mediastinum/Nodes: There are no enlarged mediastinal, hilar or axillary lymph nodes. The thyroid gland, trachea and esophagus demonstrate no significant findings.  Lungs/Pleura: Trace left pleural effusion. Stable mild centrilobular emphysema, biapical scarring and chronic left lower lobe atelectasis or scarring. The previously demonstrated part solid left upper lobe nodule has contracted and now appears more linear, measuring 7 x 3 mm on image 26/301, likely of focus of postinflammatory scarring. No suspicious pulmonary nodules.  Musculoskeletal/Chest wall: No chest wall mass or suspicious osseous findings. Old fractures of the proximal right humerus, bilateral ribs and thoracic spine are grossly stable.  CT ABDOMEN AND PELVIS FINDINGS  Hepatobiliary: The liver is normal in density without suspicious focal abnormality. No evidence of gallstones, gallbladder wall thickening or biliary dilatation.  Pancreas: Unremarkable. No pancreatic ductal dilatation or surrounding inflammatory changes.  Spleen: Normal in size without focal abnormality.  Adrenals/Urinary Tract: Stable postsurgical changes from right nephro ureterectomy. No evidence of mass lesion in the nephrectomy bed. Stable 2.2 x 1.3 cm left adrenal nodule which has been previously  characterized as an adenoma. The right adrenal gland is not clearly visualized. No suspicious left renal findings. No evidence of urinary tract calculus or hydronephrosis.  The bladder appears normal for its degree of distention.  Stomach/Bowel: Enteric contrast has passed into the mid colon. The stomach appears unremarkable for its degree of distension. No evidence of bowel wall thickening, distention or surrounding inflammatory change. Duodenal diverticula are again noted. There is a sigmoid colon anastomosis. There is moderate stool throughout the colon.  Vascular/Lymphatic: There are no enlarged abdominal or pelvic lymph nodes. Diffuse aortic and branch vessel atherosclerosis without evidence of large vessel occlusion. The left femoral artery appears attenuated in the proximal left thigh.  Reproductive: Hysterectomy. No adnexal mass.  Other: No evidence of abdominal wall mass or hernia. No ascites.  Musculoskeletal: No acute or significant osseous findings. Stable degenerative changes in the spine associated with a convex left thoracolumbar scoliosis.  IMPRESSION: 1. No evidence of local recurrence or metastatic disease in the chest, abdomen or pelvis. 2. The previously demonstrated part solid left upper lobe pulmonary nodule has contracted and now appears more linear, likely of postinflammatory scarring. 3. Stable postsurgical changes from right nephro ureterectomy. 4. Stable left adrenal nodule, previously characterized as an adenoma. 5. Aortic Atherosclerosis (ICD10-I70.0) and Emphysema (ICD10-J43.9).  ASSESSMENT & PLAN:  Assessment/Plan:  A 89 y.o. female with stage IV high grade urothelial carcinoma, who was on nivolumab immunotherapy up until March 2023.  In clinic today, I went over her CT scan images, for which she could see she remains disease free.  Understandably, the patient was pleased with her CT scan images.  I will remain conservative with her disease  management.  I will see her back in 6 months for repeat clinical assessment.  The patient understands all the plans discussed today and is in agreement with them.      Seraphim Affinito Kirby Funk, MD

## 2023-01-11 NOTE — Telephone Encounter (Addendum)
I notified pt's spouse that the TSH results are not back. He req I call them once they are.

## 2023-01-12 ENCOUNTER — Encounter: Payer: Self-pay | Admitting: Oncology

## 2023-01-17 DIAGNOSIS — I129 Hypertensive chronic kidney disease with stage 1 through stage 4 chronic kidney disease, or unspecified chronic kidney disease: Secondary | ICD-10-CM | POA: Diagnosis not present

## 2023-01-17 DIAGNOSIS — N183 Chronic kidney disease, stage 3 unspecified: Secondary | ICD-10-CM | POA: Diagnosis not present

## 2023-01-17 DIAGNOSIS — Z1339 Encounter for screening examination for other mental health and behavioral disorders: Secondary | ICD-10-CM | POA: Diagnosis not present

## 2023-01-17 DIAGNOSIS — Z1389 Encounter for screening for other disorder: Secondary | ICD-10-CM | POA: Diagnosis not present

## 2023-01-17 DIAGNOSIS — Z139 Encounter for screening, unspecified: Secondary | ICD-10-CM | POA: Diagnosis not present

## 2023-01-17 DIAGNOSIS — Z0000 Encounter for general adult medical examination without abnormal findings: Secondary | ICD-10-CM | POA: Diagnosis not present

## 2023-01-17 DIAGNOSIS — Z1331 Encounter for screening for depression: Secondary | ICD-10-CM | POA: Diagnosis not present

## 2023-01-17 DIAGNOSIS — E039 Hypothyroidism, unspecified: Secondary | ICD-10-CM | POA: Diagnosis not present

## 2023-01-17 DIAGNOSIS — L309 Dermatitis, unspecified: Secondary | ICD-10-CM | POA: Diagnosis not present

## 2023-01-18 DIAGNOSIS — R609 Edema, unspecified: Secondary | ICD-10-CM | POA: Diagnosis not present

## 2023-01-18 DIAGNOSIS — Z6824 Body mass index (BMI) 24.0-24.9, adult: Secondary | ICD-10-CM | POA: Diagnosis not present

## 2023-01-21 DIAGNOSIS — R609 Edema, unspecified: Secondary | ICD-10-CM | POA: Diagnosis not present

## 2023-01-23 DIAGNOSIS — I10 Essential (primary) hypertension: Secondary | ICD-10-CM | POA: Diagnosis not present

## 2023-01-23 DIAGNOSIS — R9431 Abnormal electrocardiogram [ECG] [EKG]: Secondary | ICD-10-CM | POA: Diagnosis not present

## 2023-01-23 DIAGNOSIS — R600 Localized edema: Secondary | ICD-10-CM | POA: Diagnosis not present

## 2023-01-23 DIAGNOSIS — Z79899 Other long term (current) drug therapy: Secondary | ICD-10-CM | POA: Diagnosis not present

## 2023-01-23 DIAGNOSIS — I440 Atrioventricular block, first degree: Secondary | ICD-10-CM | POA: Diagnosis not present

## 2023-01-23 DIAGNOSIS — E7800 Pure hypercholesterolemia, unspecified: Secondary | ICD-10-CM | POA: Diagnosis not present

## 2023-01-24 DIAGNOSIS — I10 Essential (primary) hypertension: Secondary | ICD-10-CM | POA: Diagnosis not present

## 2023-01-24 DIAGNOSIS — R9431 Abnormal electrocardiogram [ECG] [EKG]: Secondary | ICD-10-CM | POA: Diagnosis not present

## 2023-01-24 DIAGNOSIS — E7800 Pure hypercholesterolemia, unspecified: Secondary | ICD-10-CM | POA: Diagnosis not present

## 2023-01-24 DIAGNOSIS — R600 Localized edema: Secondary | ICD-10-CM | POA: Diagnosis not present

## 2023-01-24 DIAGNOSIS — I440 Atrioventricular block, first degree: Secondary | ICD-10-CM | POA: Diagnosis not present

## 2023-01-25 ENCOUNTER — Encounter: Payer: Self-pay | Admitting: Oncology

## 2023-01-25 DIAGNOSIS — I129 Hypertensive chronic kidney disease with stage 1 through stage 4 chronic kidney disease, or unspecified chronic kidney disease: Secondary | ICD-10-CM | POA: Diagnosis not present

## 2023-01-26 DIAGNOSIS — C679 Malignant neoplasm of bladder, unspecified: Secondary | ICD-10-CM | POA: Diagnosis not present

## 2023-01-26 DIAGNOSIS — C799 Secondary malignant neoplasm of unspecified site: Secondary | ICD-10-CM | POA: Diagnosis not present

## 2023-01-28 DIAGNOSIS — Z7689 Persons encountering health services in other specified circumstances: Secondary | ICD-10-CM | POA: Diagnosis not present

## 2023-01-28 DIAGNOSIS — N1832 Chronic kidney disease, stage 3b: Secondary | ICD-10-CM | POA: Diagnosis not present

## 2023-01-28 DIAGNOSIS — I872 Venous insufficiency (chronic) (peripheral): Secondary | ICD-10-CM | POA: Diagnosis not present

## 2023-01-28 DIAGNOSIS — R600 Localized edema: Secondary | ICD-10-CM | POA: Diagnosis not present

## 2023-01-28 DIAGNOSIS — Z6824 Body mass index (BMI) 24.0-24.9, adult: Secondary | ICD-10-CM | POA: Diagnosis not present

## 2023-01-28 DIAGNOSIS — Z905 Acquired absence of kidney: Secondary | ICD-10-CM | POA: Diagnosis not present

## 2023-01-29 DIAGNOSIS — I6200 Nontraumatic subdural hemorrhage, unspecified: Secondary | ICD-10-CM | POA: Diagnosis not present

## 2023-01-29 DIAGNOSIS — W19XXXA Unspecified fall, initial encounter: Secondary | ICD-10-CM | POA: Diagnosis not present

## 2023-01-29 DIAGNOSIS — S42001A Fracture of unspecified part of right clavicle, initial encounter for closed fracture: Secondary | ICD-10-CM | POA: Diagnosis not present

## 2023-01-29 DIAGNOSIS — I10 Essential (primary) hypertension: Secondary | ICD-10-CM | POA: Diagnosis not present

## 2023-01-29 DIAGNOSIS — S065X0A Traumatic subdural hemorrhage without loss of consciousness, initial encounter: Secondary | ICD-10-CM | POA: Diagnosis not present

## 2023-01-29 DIAGNOSIS — S0101XA Laceration without foreign body of scalp, initial encounter: Secondary | ICD-10-CM | POA: Diagnosis not present

## 2023-01-29 DIAGNOSIS — E7800 Pure hypercholesterolemia, unspecified: Secondary | ICD-10-CM | POA: Diagnosis not present

## 2023-01-29 DIAGNOSIS — S42034A Nondisplaced fracture of lateral end of right clavicle, initial encounter for closed fracture: Secondary | ICD-10-CM | POA: Diagnosis not present

## 2023-01-30 DIAGNOSIS — S42001A Fracture of unspecified part of right clavicle, initial encounter for closed fracture: Secondary | ICD-10-CM | POA: Diagnosis not present

## 2023-01-30 DIAGNOSIS — I6200 Nontraumatic subdural hemorrhage, unspecified: Secondary | ICD-10-CM | POA: Diagnosis not present

## 2023-02-02 ENCOUNTER — Telehealth: Payer: Self-pay

## 2023-02-02 NOTE — Telephone Encounter (Signed)
Transition Care Management Follow-up Telephone Call Date of discharge and from where: 4/29/20024 [facility] How have you been since you were released from the hospital? Patient stated she is feeling much better. Any questions or concerns? No  Items Reviewed: Did the pt receive and understand the discharge instructions provided? Yes  Medications obtained and verified? Yes  Other? No  Any new allergies since your discharge? No  Dietary orders reviewed? Yes Do you have support at home? Yes   Follow up appointments reviewed:  PCP Hospital f/u appt confirmed? Yes  Scheduled to see Dr. Abner Greenspan on  @ . Specialist Hospital f/u appt confirmed? No  Scheduled to see  on  @ . Are transportation arrangements needed? No  If their condition worsens, is the pt aware to call PCP or go to the Emergency Dept.? Yes Was the patient provided with contact information for the PCP's office or ED? Yes Was to pt encouraged to call back with questions or concerns? Yes  Jenina Moening [facility]  [facility] Population Health Community Resource Care Guide   [email or web address]  ?? [phone]   Website: [email or web address]  [email or web address]

## 2023-02-03 DIAGNOSIS — Z9181 History of falling: Secondary | ICD-10-CM | POA: Diagnosis not present

## 2023-02-03 DIAGNOSIS — Z6824 Body mass index (BMI) 24.0-24.9, adult: Secondary | ICD-10-CM | POA: Diagnosis not present

## 2023-02-03 DIAGNOSIS — Z7689 Persons encountering health services in other specified circumstances: Secondary | ICD-10-CM | POA: Diagnosis not present

## 2023-02-03 DIAGNOSIS — S065XAA Traumatic subdural hemorrhage with loss of consciousness status unknown, initial encounter: Secondary | ICD-10-CM | POA: Diagnosis not present

## 2023-02-03 DIAGNOSIS — S42009A Fracture of unspecified part of unspecified clavicle, initial encounter for closed fracture: Secondary | ICD-10-CM | POA: Diagnosis not present

## 2023-02-07 DIAGNOSIS — R531 Weakness: Secondary | ICD-10-CM | POA: Diagnosis not present

## 2023-02-07 DIAGNOSIS — D649 Anemia, unspecified: Secondary | ICD-10-CM | POA: Diagnosis not present

## 2023-02-07 DIAGNOSIS — G629 Polyneuropathy, unspecified: Secondary | ICD-10-CM | POA: Diagnosis not present

## 2023-02-07 DIAGNOSIS — S42031G Displaced fracture of lateral end of right clavicle, subsequent encounter for fracture with delayed healing: Secondary | ICD-10-CM | POA: Diagnosis not present

## 2023-02-07 DIAGNOSIS — D72819 Decreased white blood cell count, unspecified: Secondary | ICD-10-CM | POA: Diagnosis not present

## 2023-02-07 DIAGNOSIS — J159 Unspecified bacterial pneumonia: Secondary | ICD-10-CM | POA: Diagnosis not present

## 2023-02-10 DIAGNOSIS — S0101XD Laceration without foreign body of scalp, subsequent encounter: Secondary | ICD-10-CM | POA: Diagnosis not present

## 2023-02-10 DIAGNOSIS — I959 Hypotension, unspecified: Secondary | ICD-10-CM | POA: Diagnosis not present

## 2023-02-10 DIAGNOSIS — Z6824 Body mass index (BMI) 24.0-24.9, adult: Secondary | ICD-10-CM | POA: Diagnosis not present

## 2023-02-17 DIAGNOSIS — W19XXXA Unspecified fall, initial encounter: Secondary | ICD-10-CM | POA: Diagnosis not present

## 2023-02-17 DIAGNOSIS — I440 Atrioventricular block, first degree: Secondary | ICD-10-CM | POA: Diagnosis not present

## 2023-02-17 DIAGNOSIS — R296 Repeated falls: Secondary | ICD-10-CM | POA: Diagnosis not present

## 2023-02-17 DIAGNOSIS — J159 Unspecified bacterial pneumonia: Secondary | ICD-10-CM | POA: Diagnosis not present

## 2023-02-17 DIAGNOSIS — I447 Left bundle-branch block, unspecified: Secondary | ICD-10-CM | POA: Diagnosis not present

## 2023-02-17 DIAGNOSIS — M6281 Muscle weakness (generalized): Secondary | ICD-10-CM | POA: Diagnosis not present

## 2023-02-17 DIAGNOSIS — J189 Pneumonia, unspecified organism: Secondary | ICD-10-CM | POA: Diagnosis not present

## 2023-02-17 DIAGNOSIS — J9811 Atelectasis: Secondary | ICD-10-CM | POA: Diagnosis not present

## 2023-02-17 DIAGNOSIS — I444 Left anterior fascicular block: Secondary | ICD-10-CM | POA: Diagnosis not present

## 2023-02-17 DIAGNOSIS — R9431 Abnormal electrocardiogram [ECG] [EKG]: Secondary | ICD-10-CM | POA: Diagnosis not present

## 2023-02-17 DIAGNOSIS — R680 Hypothermia, not associated with low environmental temperature: Secondary | ICD-10-CM | POA: Diagnosis not present

## 2023-02-17 DIAGNOSIS — I10 Essential (primary) hypertension: Secondary | ICD-10-CM | POA: Diagnosis not present

## 2023-02-17 DIAGNOSIS — I6782 Cerebral ischemia: Secondary | ICD-10-CM | POA: Diagnosis not present

## 2023-02-17 DIAGNOSIS — J90 Pleural effusion, not elsewhere classified: Secondary | ICD-10-CM | POA: Diagnosis not present

## 2023-02-17 DIAGNOSIS — R531 Weakness: Secondary | ICD-10-CM | POA: Diagnosis not present

## 2023-02-17 DIAGNOSIS — R269 Unspecified abnormalities of gait and mobility: Secondary | ICD-10-CM | POA: Diagnosis not present

## 2023-02-17 DIAGNOSIS — M1611 Unilateral primary osteoarthritis, right hip: Secondary | ICD-10-CM | POA: Diagnosis not present

## 2023-02-17 DIAGNOSIS — E7800 Pure hypercholesterolemia, unspecified: Secondary | ICD-10-CM | POA: Diagnosis not present

## 2023-02-17 DIAGNOSIS — Z743 Need for continuous supervision: Secondary | ICD-10-CM | POA: Diagnosis not present

## 2023-02-17 DIAGNOSIS — E039 Hypothyroidism, unspecified: Secondary | ICD-10-CM | POA: Diagnosis not present

## 2023-02-17 DIAGNOSIS — T68XXXA Hypothermia, initial encounter: Secondary | ICD-10-CM | POA: Diagnosis not present

## 2023-02-18 DIAGNOSIS — J90 Pleural effusion, not elsewhere classified: Secondary | ICD-10-CM | POA: Diagnosis not present

## 2023-02-18 DIAGNOSIS — I10 Essential (primary) hypertension: Secondary | ICD-10-CM | POA: Diagnosis not present

## 2023-02-26 DIAGNOSIS — D649 Anemia, unspecified: Secondary | ICD-10-CM | POA: Diagnosis not present

## 2023-02-26 DIAGNOSIS — E039 Hypothyroidism, unspecified: Secondary | ICD-10-CM | POA: Diagnosis not present

## 2023-02-26 DIAGNOSIS — R609 Edema, unspecified: Secondary | ICD-10-CM | POA: Diagnosis not present

## 2023-02-26 DIAGNOSIS — S065X0S Traumatic subdural hemorrhage without loss of consciousness, sequela: Secondary | ICD-10-CM | POA: Diagnosis not present

## 2023-02-26 DIAGNOSIS — C679 Malignant neoplasm of bladder, unspecified: Secondary | ICD-10-CM | POA: Diagnosis not present

## 2023-02-26 DIAGNOSIS — S42001D Fracture of unspecified part of right clavicle, subsequent encounter for fracture with routine healing: Secondary | ICD-10-CM | POA: Diagnosis not present

## 2023-02-26 DIAGNOSIS — C799 Secondary malignant neoplasm of unspecified site: Secondary | ICD-10-CM | POA: Diagnosis not present

## 2023-02-26 DIAGNOSIS — I10 Essential (primary) hypertension: Secondary | ICD-10-CM | POA: Diagnosis not present

## 2023-02-26 DIAGNOSIS — D72819 Decreased white blood cell count, unspecified: Secondary | ICD-10-CM | POA: Diagnosis not present

## 2023-02-26 DIAGNOSIS — D61818 Other pancytopenia: Secondary | ICD-10-CM | POA: Diagnosis not present

## 2023-02-28 DIAGNOSIS — R278 Other lack of coordination: Secondary | ICD-10-CM | POA: Diagnosis not present

## 2023-02-28 DIAGNOSIS — R296 Repeated falls: Secondary | ICD-10-CM | POA: Diagnosis not present

## 2023-02-28 DIAGNOSIS — R2681 Unsteadiness on feet: Secondary | ICD-10-CM | POA: Diagnosis not present

## 2023-02-28 DIAGNOSIS — M6259 Muscle wasting and atrophy, not elsewhere classified, multiple sites: Secondary | ICD-10-CM | POA: Diagnosis not present

## 2023-03-01 DIAGNOSIS — R2681 Unsteadiness on feet: Secondary | ICD-10-CM | POA: Diagnosis not present

## 2023-03-01 DIAGNOSIS — R296 Repeated falls: Secondary | ICD-10-CM | POA: Diagnosis not present

## 2023-03-01 DIAGNOSIS — M6259 Muscle wasting and atrophy, not elsewhere classified, multiple sites: Secondary | ICD-10-CM | POA: Diagnosis not present

## 2023-03-01 DIAGNOSIS — R278 Other lack of coordination: Secondary | ICD-10-CM | POA: Diagnosis not present

## 2023-03-02 DIAGNOSIS — S065X0S Traumatic subdural hemorrhage without loss of consciousness, sequela: Secondary | ICD-10-CM | POA: Diagnosis not present

## 2023-03-02 DIAGNOSIS — M6259 Muscle wasting and atrophy, not elsewhere classified, multiple sites: Secondary | ICD-10-CM | POA: Diagnosis not present

## 2023-03-02 DIAGNOSIS — S42001D Fracture of unspecified part of right clavicle, subsequent encounter for fracture with routine healing: Secondary | ICD-10-CM | POA: Diagnosis not present

## 2023-03-02 DIAGNOSIS — D72819 Decreased white blood cell count, unspecified: Secondary | ICD-10-CM | POA: Diagnosis not present

## 2023-03-02 DIAGNOSIS — D649 Anemia, unspecified: Secondary | ICD-10-CM | POA: Diagnosis not present

## 2023-03-02 DIAGNOSIS — R2681 Unsteadiness on feet: Secondary | ICD-10-CM | POA: Diagnosis not present

## 2023-03-02 DIAGNOSIS — R609 Edema, unspecified: Secondary | ICD-10-CM | POA: Diagnosis not present

## 2023-03-02 DIAGNOSIS — I10 Essential (primary) hypertension: Secondary | ICD-10-CM | POA: Diagnosis not present

## 2023-03-02 DIAGNOSIS — E039 Hypothyroidism, unspecified: Secondary | ICD-10-CM | POA: Diagnosis not present

## 2023-03-02 DIAGNOSIS — R296 Repeated falls: Secondary | ICD-10-CM | POA: Diagnosis not present

## 2023-03-02 DIAGNOSIS — D61818 Other pancytopenia: Secondary | ICD-10-CM | POA: Diagnosis not present

## 2023-03-02 DIAGNOSIS — R278 Other lack of coordination: Secondary | ICD-10-CM | POA: Diagnosis not present

## 2023-03-03 DIAGNOSIS — M6259 Muscle wasting and atrophy, not elsewhere classified, multiple sites: Secondary | ICD-10-CM | POA: Diagnosis not present

## 2023-03-03 DIAGNOSIS — R2681 Unsteadiness on feet: Secondary | ICD-10-CM | POA: Diagnosis not present

## 2023-03-03 DIAGNOSIS — R278 Other lack of coordination: Secondary | ICD-10-CM | POA: Diagnosis not present

## 2023-03-03 DIAGNOSIS — R296 Repeated falls: Secondary | ICD-10-CM | POA: Diagnosis not present

## 2023-03-04 DIAGNOSIS — R296 Repeated falls: Secondary | ICD-10-CM | POA: Diagnosis not present

## 2023-03-04 DIAGNOSIS — R278 Other lack of coordination: Secondary | ICD-10-CM | POA: Diagnosis not present

## 2023-03-04 DIAGNOSIS — M6259 Muscle wasting and atrophy, not elsewhere classified, multiple sites: Secondary | ICD-10-CM | POA: Diagnosis not present

## 2023-03-04 DIAGNOSIS — R2681 Unsteadiness on feet: Secondary | ICD-10-CM | POA: Diagnosis not present

## 2023-03-07 DIAGNOSIS — M6259 Muscle wasting and atrophy, not elsewhere classified, multiple sites: Secondary | ICD-10-CM | POA: Diagnosis not present

## 2023-03-07 DIAGNOSIS — R278 Other lack of coordination: Secondary | ICD-10-CM | POA: Diagnosis not present

## 2023-03-07 DIAGNOSIS — R296 Repeated falls: Secondary | ICD-10-CM | POA: Diagnosis not present

## 2023-03-07 DIAGNOSIS — R2681 Unsteadiness on feet: Secondary | ICD-10-CM | POA: Diagnosis not present

## 2023-03-08 DIAGNOSIS — R2681 Unsteadiness on feet: Secondary | ICD-10-CM | POA: Diagnosis not present

## 2023-03-08 DIAGNOSIS — R278 Other lack of coordination: Secondary | ICD-10-CM | POA: Diagnosis not present

## 2023-03-08 DIAGNOSIS — M6259 Muscle wasting and atrophy, not elsewhere classified, multiple sites: Secondary | ICD-10-CM | POA: Diagnosis not present

## 2023-03-08 DIAGNOSIS — R296 Repeated falls: Secondary | ICD-10-CM | POA: Diagnosis not present

## 2023-03-08 DIAGNOSIS — S42001A Fracture of unspecified part of right clavicle, initial encounter for closed fracture: Secondary | ICD-10-CM | POA: Diagnosis not present

## 2023-03-09 DIAGNOSIS — R278 Other lack of coordination: Secondary | ICD-10-CM | POA: Diagnosis not present

## 2023-03-09 DIAGNOSIS — R2681 Unsteadiness on feet: Secondary | ICD-10-CM | POA: Diagnosis not present

## 2023-03-09 DIAGNOSIS — M6259 Muscle wasting and atrophy, not elsewhere classified, multiple sites: Secondary | ICD-10-CM | POA: Diagnosis not present

## 2023-03-09 DIAGNOSIS — R296 Repeated falls: Secondary | ICD-10-CM | POA: Diagnosis not present

## 2023-03-10 DIAGNOSIS — M6259 Muscle wasting and atrophy, not elsewhere classified, multiple sites: Secondary | ICD-10-CM | POA: Diagnosis not present

## 2023-03-10 DIAGNOSIS — R278 Other lack of coordination: Secondary | ICD-10-CM | POA: Diagnosis not present

## 2023-03-10 DIAGNOSIS — R2681 Unsteadiness on feet: Secondary | ICD-10-CM | POA: Diagnosis not present

## 2023-03-10 DIAGNOSIS — R296 Repeated falls: Secondary | ICD-10-CM | POA: Diagnosis not present

## 2023-03-11 DIAGNOSIS — S42001D Fracture of unspecified part of right clavicle, subsequent encounter for fracture with routine healing: Secondary | ICD-10-CM | POA: Diagnosis not present

## 2023-03-11 DIAGNOSIS — R609 Edema, unspecified: Secondary | ICD-10-CM | POA: Diagnosis not present

## 2023-03-11 DIAGNOSIS — D61818 Other pancytopenia: Secondary | ICD-10-CM | POA: Diagnosis not present

## 2023-03-11 DIAGNOSIS — I10 Essential (primary) hypertension: Secondary | ICD-10-CM | POA: Diagnosis not present

## 2023-03-11 DIAGNOSIS — D649 Anemia, unspecified: Secondary | ICD-10-CM | POA: Diagnosis not present

## 2023-03-11 DIAGNOSIS — D72819 Decreased white blood cell count, unspecified: Secondary | ICD-10-CM | POA: Diagnosis not present

## 2023-03-11 DIAGNOSIS — S065X0S Traumatic subdural hemorrhage without loss of consciousness, sequela: Secondary | ICD-10-CM | POA: Diagnosis not present

## 2023-03-11 DIAGNOSIS — E039 Hypothyroidism, unspecified: Secondary | ICD-10-CM | POA: Diagnosis not present

## 2023-03-14 DIAGNOSIS — D631 Anemia in chronic kidney disease: Secondary | ICD-10-CM | POA: Diagnosis not present

## 2023-03-14 DIAGNOSIS — I2510 Atherosclerotic heart disease of native coronary artery without angina pectoris: Secondary | ICD-10-CM | POA: Diagnosis not present

## 2023-03-14 DIAGNOSIS — N1832 Chronic kidney disease, stage 3b: Secondary | ICD-10-CM | POA: Diagnosis not present

## 2023-03-14 DIAGNOSIS — I700 Atherosclerosis of aorta: Secondary | ICD-10-CM | POA: Diagnosis not present

## 2023-03-14 DIAGNOSIS — M80011G Age-related osteoporosis with current pathological fracture, right shoulder, subsequent encounter for fracture with delayed healing: Secondary | ICD-10-CM | POA: Diagnosis not present

## 2023-03-14 DIAGNOSIS — I1310 Hypertensive heart and chronic kidney disease without heart failure, with stage 1 through stage 4 chronic kidney disease, or unspecified chronic kidney disease: Secondary | ICD-10-CM | POA: Diagnosis not present

## 2023-03-14 DIAGNOSIS — C679 Malignant neoplasm of bladder, unspecified: Secondary | ICD-10-CM | POA: Diagnosis not present

## 2023-03-14 DIAGNOSIS — C786 Secondary malignant neoplasm of retroperitoneum and peritoneum: Secondary | ICD-10-CM | POA: Diagnosis not present

## 2023-03-14 DIAGNOSIS — D630 Anemia in neoplastic disease: Secondary | ICD-10-CM | POA: Diagnosis not present

## 2023-03-16 DIAGNOSIS — D631 Anemia in chronic kidney disease: Secondary | ICD-10-CM | POA: Diagnosis not present

## 2023-03-16 DIAGNOSIS — N1832 Chronic kidney disease, stage 3b: Secondary | ICD-10-CM | POA: Diagnosis not present

## 2023-03-16 DIAGNOSIS — D630 Anemia in neoplastic disease: Secondary | ICD-10-CM | POA: Diagnosis not present

## 2023-03-16 DIAGNOSIS — I2510 Atherosclerotic heart disease of native coronary artery without angina pectoris: Secondary | ICD-10-CM | POA: Diagnosis not present

## 2023-03-16 DIAGNOSIS — C679 Malignant neoplasm of bladder, unspecified: Secondary | ICD-10-CM | POA: Diagnosis not present

## 2023-03-16 DIAGNOSIS — I1310 Hypertensive heart and chronic kidney disease without heart failure, with stage 1 through stage 4 chronic kidney disease, or unspecified chronic kidney disease: Secondary | ICD-10-CM | POA: Diagnosis not present

## 2023-03-16 DIAGNOSIS — I700 Atherosclerosis of aorta: Secondary | ICD-10-CM | POA: Diagnosis not present

## 2023-03-16 DIAGNOSIS — C786 Secondary malignant neoplasm of retroperitoneum and peritoneum: Secondary | ICD-10-CM | POA: Diagnosis not present

## 2023-03-16 DIAGNOSIS — M80011G Age-related osteoporosis with current pathological fracture, right shoulder, subsequent encounter for fracture with delayed healing: Secondary | ICD-10-CM | POA: Diagnosis not present

## 2023-03-21 DIAGNOSIS — D630 Anemia in neoplastic disease: Secondary | ICD-10-CM | POA: Diagnosis not present

## 2023-03-21 DIAGNOSIS — I700 Atherosclerosis of aorta: Secondary | ICD-10-CM | POA: Diagnosis not present

## 2023-03-21 DIAGNOSIS — C679 Malignant neoplasm of bladder, unspecified: Secondary | ICD-10-CM | POA: Diagnosis not present

## 2023-03-21 DIAGNOSIS — D631 Anemia in chronic kidney disease: Secondary | ICD-10-CM | POA: Diagnosis not present

## 2023-03-21 DIAGNOSIS — I2510 Atherosclerotic heart disease of native coronary artery without angina pectoris: Secondary | ICD-10-CM | POA: Diagnosis not present

## 2023-03-21 DIAGNOSIS — I1310 Hypertensive heart and chronic kidney disease without heart failure, with stage 1 through stage 4 chronic kidney disease, or unspecified chronic kidney disease: Secondary | ICD-10-CM | POA: Diagnosis not present

## 2023-03-21 DIAGNOSIS — N1832 Chronic kidney disease, stage 3b: Secondary | ICD-10-CM | POA: Diagnosis not present

## 2023-03-21 DIAGNOSIS — M25551 Pain in right hip: Secondary | ICD-10-CM | POA: Diagnosis not present

## 2023-03-21 DIAGNOSIS — M80011G Age-related osteoporosis with current pathological fracture, right shoulder, subsequent encounter for fracture with delayed healing: Secondary | ICD-10-CM | POA: Diagnosis not present

## 2023-03-21 DIAGNOSIS — C786 Secondary malignant neoplasm of retroperitoneum and peritoneum: Secondary | ICD-10-CM | POA: Diagnosis not present

## 2023-03-23 DIAGNOSIS — E039 Hypothyroidism, unspecified: Secondary | ICD-10-CM | POA: Diagnosis not present

## 2023-03-23 DIAGNOSIS — R5383 Other fatigue: Secondary | ICD-10-CM | POA: Diagnosis not present

## 2023-03-24 DIAGNOSIS — D649 Anemia, unspecified: Secondary | ICD-10-CM | POA: Diagnosis not present

## 2023-03-24 DIAGNOSIS — D3502 Benign neoplasm of left adrenal gland: Secondary | ICD-10-CM | POA: Diagnosis not present

## 2023-03-24 DIAGNOSIS — R195 Other fecal abnormalities: Secondary | ICD-10-CM | POA: Diagnosis not present

## 2023-03-24 DIAGNOSIS — Z6824 Body mass index (BMI) 24.0-24.9, adult: Secondary | ICD-10-CM | POA: Diagnosis not present

## 2023-03-24 DIAGNOSIS — R1031 Right lower quadrant pain: Secondary | ICD-10-CM | POA: Diagnosis not present

## 2023-03-24 DIAGNOSIS — R531 Weakness: Secondary | ICD-10-CM | POA: Diagnosis not present

## 2023-03-28 ENCOUNTER — Ambulatory Visit (HOSPITAL_COMMUNITY)
Admission: RE | Admit: 2023-03-28 | Discharge: 2023-03-28 | Disposition: A | Discharge status: Home / Self Care | Payer: Medicare PPO | Source: Ambulatory Visit | Attending: Physical Medicine and Rehabilitation | Admitting: Physical Medicine and Rehabilitation

## 2023-03-28 ENCOUNTER — Other Ambulatory Visit (HOSPITAL_COMMUNITY): Payer: Self-pay | Admitting: Physical Medicine and Rehabilitation

## 2023-03-28 DIAGNOSIS — M5136 Other intervertebral disc degeneration, lumbar region: Secondary | ICD-10-CM | POA: Diagnosis not present

## 2023-03-28 DIAGNOSIS — R102 Pelvic and perineal pain: Secondary | ICD-10-CM | POA: Insufficient documentation

## 2023-03-28 DIAGNOSIS — M898X8 Other specified disorders of bone, other site: Secondary | ICD-10-CM

## 2023-03-28 DIAGNOSIS — C799 Secondary malignant neoplasm of unspecified site: Secondary | ICD-10-CM | POA: Diagnosis not present

## 2023-03-28 DIAGNOSIS — I700 Atherosclerosis of aorta: Secondary | ICD-10-CM | POA: Diagnosis not present

## 2023-03-28 DIAGNOSIS — M545 Low back pain, unspecified: Secondary | ICD-10-CM | POA: Diagnosis not present

## 2023-03-28 DIAGNOSIS — M160 Bilateral primary osteoarthritis of hip: Secondary | ICD-10-CM | POA: Diagnosis not present

## 2023-03-28 DIAGNOSIS — R195 Other fecal abnormalities: Secondary | ICD-10-CM | POA: Diagnosis not present

## 2023-03-28 DIAGNOSIS — C679 Malignant neoplasm of bladder, unspecified: Secondary | ICD-10-CM | POA: Diagnosis not present

## 2023-04-04 DIAGNOSIS — C786 Secondary malignant neoplasm of retroperitoneum and peritoneum: Secondary | ICD-10-CM | POA: Diagnosis not present

## 2023-04-04 DIAGNOSIS — C679 Malignant neoplasm of bladder, unspecified: Secondary | ICD-10-CM | POA: Diagnosis not present

## 2023-04-04 DIAGNOSIS — I1310 Hypertensive heart and chronic kidney disease without heart failure, with stage 1 through stage 4 chronic kidney disease, or unspecified chronic kidney disease: Secondary | ICD-10-CM | POA: Diagnosis not present

## 2023-04-04 DIAGNOSIS — D630 Anemia in neoplastic disease: Secondary | ICD-10-CM | POA: Diagnosis not present

## 2023-04-04 DIAGNOSIS — I700 Atherosclerosis of aorta: Secondary | ICD-10-CM | POA: Diagnosis not present

## 2023-04-04 DIAGNOSIS — M80011G Age-related osteoporosis with current pathological fracture, right shoulder, subsequent encounter for fracture with delayed healing: Secondary | ICD-10-CM | POA: Diagnosis not present

## 2023-04-04 DIAGNOSIS — N1832 Chronic kidney disease, stage 3b: Secondary | ICD-10-CM | POA: Diagnosis not present

## 2023-04-04 DIAGNOSIS — D631 Anemia in chronic kidney disease: Secondary | ICD-10-CM | POA: Diagnosis not present

## 2023-04-04 DIAGNOSIS — I2510 Atherosclerotic heart disease of native coronary artery without angina pectoris: Secondary | ICD-10-CM | POA: Diagnosis not present

## 2023-04-12 DIAGNOSIS — I2510 Atherosclerotic heart disease of native coronary artery without angina pectoris: Secondary | ICD-10-CM | POA: Diagnosis not present

## 2023-04-12 DIAGNOSIS — D630 Anemia in neoplastic disease: Secondary | ICD-10-CM | POA: Diagnosis not present

## 2023-04-12 DIAGNOSIS — C786 Secondary malignant neoplasm of retroperitoneum and peritoneum: Secondary | ICD-10-CM | POA: Diagnosis not present

## 2023-04-12 DIAGNOSIS — N1832 Chronic kidney disease, stage 3b: Secondary | ICD-10-CM | POA: Diagnosis not present

## 2023-04-12 DIAGNOSIS — I700 Atherosclerosis of aorta: Secondary | ICD-10-CM | POA: Diagnosis not present

## 2023-04-12 DIAGNOSIS — C679 Malignant neoplasm of bladder, unspecified: Secondary | ICD-10-CM | POA: Diagnosis not present

## 2023-04-12 DIAGNOSIS — M80011G Age-related osteoporosis with current pathological fracture, right shoulder, subsequent encounter for fracture with delayed healing: Secondary | ICD-10-CM | POA: Diagnosis not present

## 2023-04-12 DIAGNOSIS — I1310 Hypertensive heart and chronic kidney disease without heart failure, with stage 1 through stage 4 chronic kidney disease, or unspecified chronic kidney disease: Secondary | ICD-10-CM | POA: Diagnosis not present

## 2023-04-12 DIAGNOSIS — D631 Anemia in chronic kidney disease: Secondary | ICD-10-CM | POA: Diagnosis not present

## 2023-04-19 DIAGNOSIS — L97921 Non-pressure chronic ulcer of unspecified part of left lower leg limited to breakdown of skin: Secondary | ICD-10-CM | POA: Diagnosis not present

## 2023-04-23 DIAGNOSIS — S42009A Fracture of unspecified part of unspecified clavicle, initial encounter for closed fracture: Secondary | ICD-10-CM | POA: Diagnosis not present

## 2023-04-23 DIAGNOSIS — R509 Fever, unspecified: Secondary | ICD-10-CM | POA: Diagnosis not present

## 2023-04-23 DIAGNOSIS — R0902 Hypoxemia: Secondary | ICD-10-CM | POA: Diagnosis not present

## 2023-04-23 DIAGNOSIS — J189 Pneumonia, unspecified organism: Secondary | ICD-10-CM | POA: Diagnosis not present

## 2023-04-23 DIAGNOSIS — L03116 Cellulitis of left lower limb: Secondary | ICD-10-CM | POA: Diagnosis not present

## 2023-04-23 DIAGNOSIS — J449 Chronic obstructive pulmonary disease, unspecified: Secondary | ICD-10-CM | POA: Diagnosis not present

## 2023-04-23 DIAGNOSIS — E7800 Pure hypercholesterolemia, unspecified: Secondary | ICD-10-CM | POA: Diagnosis not present

## 2023-04-23 DIAGNOSIS — I959 Hypotension, unspecified: Secondary | ICD-10-CM | POA: Diagnosis not present

## 2023-04-23 DIAGNOSIS — I10 Essential (primary) hypertension: Secondary | ICD-10-CM | POA: Diagnosis not present

## 2023-04-23 DIAGNOSIS — A419 Sepsis, unspecified organism: Secondary | ICD-10-CM | POA: Diagnosis not present

## 2023-04-23 DIAGNOSIS — I700 Atherosclerosis of aorta: Secondary | ICD-10-CM | POA: Diagnosis not present

## 2023-04-23 DIAGNOSIS — Z905 Acquired absence of kidney: Secondary | ICD-10-CM | POA: Diagnosis not present

## 2023-04-23 DIAGNOSIS — J90 Pleural effusion, not elsewhere classified: Secondary | ICD-10-CM | POA: Diagnosis not present

## 2023-04-23 DIAGNOSIS — Z48813 Encounter for surgical aftercare following surgery on the respiratory system: Secondary | ICD-10-CM | POA: Diagnosis not present

## 2023-04-23 DIAGNOSIS — I447 Left bundle-branch block, unspecified: Secondary | ICD-10-CM | POA: Diagnosis not present

## 2023-04-23 DIAGNOSIS — E039 Hypothyroidism, unspecified: Secondary | ICD-10-CM | POA: Diagnosis not present

## 2023-04-23 DIAGNOSIS — R339 Retention of urine, unspecified: Secondary | ICD-10-CM | POA: Diagnosis not present

## 2023-04-23 DIAGNOSIS — I517 Cardiomegaly: Secondary | ICD-10-CM | POA: Diagnosis not present

## 2023-04-23 DIAGNOSIS — M1611 Unilateral primary osteoarthritis, right hip: Secondary | ICD-10-CM | POA: Diagnosis not present

## 2023-04-27 DIAGNOSIS — S81812A Laceration without foreign body, left lower leg, initial encounter: Secondary | ICD-10-CM | POA: Diagnosis not present

## 2023-04-27 DIAGNOSIS — L03116 Cellulitis of left lower limb: Secondary | ICD-10-CM | POA: Diagnosis not present

## 2023-04-27 DIAGNOSIS — I2781 Cor pulmonale (chronic): Secondary | ICD-10-CM | POA: Diagnosis not present

## 2023-04-27 DIAGNOSIS — M80011G Age-related osteoporosis with current pathological fracture, right shoulder, subsequent encounter for fracture with delayed healing: Secondary | ICD-10-CM | POA: Diagnosis not present

## 2023-04-27 DIAGNOSIS — C679 Malignant neoplasm of bladder, unspecified: Secondary | ICD-10-CM | POA: Diagnosis not present

## 2023-04-27 DIAGNOSIS — C786 Secondary malignant neoplasm of retroperitoneum and peritoneum: Secondary | ICD-10-CM | POA: Diagnosis not present

## 2023-04-27 DIAGNOSIS — N1832 Chronic kidney disease, stage 3b: Secondary | ICD-10-CM | POA: Diagnosis not present

## 2023-04-27 DIAGNOSIS — I1310 Hypertensive heart and chronic kidney disease without heart failure, with stage 1 through stage 4 chronic kidney disease, or unspecified chronic kidney disease: Secondary | ICD-10-CM | POA: Diagnosis not present

## 2023-04-27 DIAGNOSIS — D631 Anemia in chronic kidney disease: Secondary | ICD-10-CM | POA: Diagnosis not present

## 2023-04-28 DIAGNOSIS — C679 Malignant neoplasm of bladder, unspecified: Secondary | ICD-10-CM | POA: Diagnosis not present

## 2023-04-28 DIAGNOSIS — C799 Secondary malignant neoplasm of unspecified site: Secondary | ICD-10-CM | POA: Diagnosis not present

## 2023-04-29 DIAGNOSIS — S81812A Laceration without foreign body, left lower leg, initial encounter: Secondary | ICD-10-CM | POA: Diagnosis not present

## 2023-04-29 DIAGNOSIS — I1310 Hypertensive heart and chronic kidney disease without heart failure, with stage 1 through stage 4 chronic kidney disease, or unspecified chronic kidney disease: Secondary | ICD-10-CM | POA: Diagnosis not present

## 2023-04-29 DIAGNOSIS — N1832 Chronic kidney disease, stage 3b: Secondary | ICD-10-CM | POA: Diagnosis not present

## 2023-04-29 DIAGNOSIS — M80011G Age-related osteoporosis with current pathological fracture, right shoulder, subsequent encounter for fracture with delayed healing: Secondary | ICD-10-CM | POA: Diagnosis not present

## 2023-04-29 DIAGNOSIS — L03116 Cellulitis of left lower limb: Secondary | ICD-10-CM | POA: Diagnosis not present

## 2023-04-29 DIAGNOSIS — I2781 Cor pulmonale (chronic): Secondary | ICD-10-CM | POA: Diagnosis not present

## 2023-04-29 DIAGNOSIS — C786 Secondary malignant neoplasm of retroperitoneum and peritoneum: Secondary | ICD-10-CM | POA: Diagnosis not present

## 2023-04-29 DIAGNOSIS — D631 Anemia in chronic kidney disease: Secondary | ICD-10-CM | POA: Diagnosis not present

## 2023-04-29 DIAGNOSIS — C679 Malignant neoplasm of bladder, unspecified: Secondary | ICD-10-CM | POA: Diagnosis not present

## 2023-05-02 DIAGNOSIS — C679 Malignant neoplasm of bladder, unspecified: Secondary | ICD-10-CM | POA: Diagnosis not present

## 2023-05-02 DIAGNOSIS — D631 Anemia in chronic kidney disease: Secondary | ICD-10-CM | POA: Diagnosis not present

## 2023-05-02 DIAGNOSIS — R2681 Unsteadiness on feet: Secondary | ICD-10-CM | POA: Diagnosis not present

## 2023-05-02 DIAGNOSIS — C786 Secondary malignant neoplasm of retroperitoneum and peritoneum: Secondary | ICD-10-CM | POA: Diagnosis not present

## 2023-05-02 DIAGNOSIS — S81812A Laceration without foreign body, left lower leg, initial encounter: Secondary | ICD-10-CM | POA: Diagnosis not present

## 2023-05-02 DIAGNOSIS — I1310 Hypertensive heart and chronic kidney disease without heart failure, with stage 1 through stage 4 chronic kidney disease, or unspecified chronic kidney disease: Secondary | ICD-10-CM | POA: Diagnosis not present

## 2023-05-02 DIAGNOSIS — I2781 Cor pulmonale (chronic): Secondary | ICD-10-CM | POA: Diagnosis not present

## 2023-05-02 DIAGNOSIS — M80011G Age-related osteoporosis with current pathological fracture, right shoulder, subsequent encounter for fracture with delayed healing: Secondary | ICD-10-CM | POA: Diagnosis not present

## 2023-05-02 DIAGNOSIS — L03116 Cellulitis of left lower limb: Secondary | ICD-10-CM | POA: Diagnosis not present

## 2023-05-02 DIAGNOSIS — R339 Retention of urine, unspecified: Secondary | ICD-10-CM | POA: Diagnosis not present

## 2023-05-02 DIAGNOSIS — R531 Weakness: Secondary | ICD-10-CM | POA: Diagnosis not present

## 2023-05-02 DIAGNOSIS — N1832 Chronic kidney disease, stage 3b: Secondary | ICD-10-CM | POA: Diagnosis not present

## 2023-05-04 DIAGNOSIS — C679 Malignant neoplasm of bladder, unspecified: Secondary | ICD-10-CM | POA: Diagnosis not present

## 2023-05-04 DIAGNOSIS — I2781 Cor pulmonale (chronic): Secondary | ICD-10-CM | POA: Diagnosis not present

## 2023-05-04 DIAGNOSIS — M80011G Age-related osteoporosis with current pathological fracture, right shoulder, subsequent encounter for fracture with delayed healing: Secondary | ICD-10-CM | POA: Diagnosis not present

## 2023-05-04 DIAGNOSIS — D631 Anemia in chronic kidney disease: Secondary | ICD-10-CM | POA: Diagnosis not present

## 2023-05-04 DIAGNOSIS — N1832 Chronic kidney disease, stage 3b: Secondary | ICD-10-CM | POA: Diagnosis not present

## 2023-05-04 DIAGNOSIS — L03116 Cellulitis of left lower limb: Secondary | ICD-10-CM | POA: Diagnosis not present

## 2023-05-04 DIAGNOSIS — S81812A Laceration without foreign body, left lower leg, initial encounter: Secondary | ICD-10-CM | POA: Diagnosis not present

## 2023-05-04 DIAGNOSIS — C786 Secondary malignant neoplasm of retroperitoneum and peritoneum: Secondary | ICD-10-CM | POA: Diagnosis not present

## 2023-05-04 DIAGNOSIS — I1310 Hypertensive heart and chronic kidney disease without heart failure, with stage 1 through stage 4 chronic kidney disease, or unspecified chronic kidney disease: Secondary | ICD-10-CM | POA: Diagnosis not present

## 2023-05-06 DIAGNOSIS — I2781 Cor pulmonale (chronic): Secondary | ICD-10-CM | POA: Diagnosis not present

## 2023-05-06 DIAGNOSIS — N1832 Chronic kidney disease, stage 3b: Secondary | ICD-10-CM | POA: Diagnosis not present

## 2023-05-06 DIAGNOSIS — C679 Malignant neoplasm of bladder, unspecified: Secondary | ICD-10-CM | POA: Diagnosis not present

## 2023-05-06 DIAGNOSIS — M80011G Age-related osteoporosis with current pathological fracture, right shoulder, subsequent encounter for fracture with delayed healing: Secondary | ICD-10-CM | POA: Diagnosis not present

## 2023-05-06 DIAGNOSIS — D631 Anemia in chronic kidney disease: Secondary | ICD-10-CM | POA: Diagnosis not present

## 2023-05-06 DIAGNOSIS — S81812A Laceration without foreign body, left lower leg, initial encounter: Secondary | ICD-10-CM | POA: Diagnosis not present

## 2023-05-06 DIAGNOSIS — L03116 Cellulitis of left lower limb: Secondary | ICD-10-CM | POA: Diagnosis not present

## 2023-05-06 DIAGNOSIS — I1310 Hypertensive heart and chronic kidney disease without heart failure, with stage 1 through stage 4 chronic kidney disease, or unspecified chronic kidney disease: Secondary | ICD-10-CM | POA: Diagnosis not present

## 2023-05-06 DIAGNOSIS — C786 Secondary malignant neoplasm of retroperitoneum and peritoneum: Secondary | ICD-10-CM | POA: Diagnosis not present

## 2023-05-09 DIAGNOSIS — I1310 Hypertensive heart and chronic kidney disease without heart failure, with stage 1 through stage 4 chronic kidney disease, or unspecified chronic kidney disease: Secondary | ICD-10-CM | POA: Diagnosis not present

## 2023-05-09 DIAGNOSIS — C786 Secondary malignant neoplasm of retroperitoneum and peritoneum: Secondary | ICD-10-CM | POA: Diagnosis not present

## 2023-05-09 DIAGNOSIS — N1832 Chronic kidney disease, stage 3b: Secondary | ICD-10-CM | POA: Diagnosis not present

## 2023-05-09 DIAGNOSIS — S81812A Laceration without foreign body, left lower leg, initial encounter: Secondary | ICD-10-CM | POA: Diagnosis not present

## 2023-05-09 DIAGNOSIS — C679 Malignant neoplasm of bladder, unspecified: Secondary | ICD-10-CM | POA: Diagnosis not present

## 2023-05-09 DIAGNOSIS — D631 Anemia in chronic kidney disease: Secondary | ICD-10-CM | POA: Diagnosis not present

## 2023-05-09 DIAGNOSIS — I2781 Cor pulmonale (chronic): Secondary | ICD-10-CM | POA: Diagnosis not present

## 2023-05-09 DIAGNOSIS — M80011G Age-related osteoporosis with current pathological fracture, right shoulder, subsequent encounter for fracture with delayed healing: Secondary | ICD-10-CM | POA: Diagnosis not present

## 2023-05-09 DIAGNOSIS — L03116 Cellulitis of left lower limb: Secondary | ICD-10-CM | POA: Diagnosis not present

## 2023-05-10 DIAGNOSIS — S81812A Laceration without foreign body, left lower leg, initial encounter: Secondary | ICD-10-CM | POA: Diagnosis not present

## 2023-05-10 DIAGNOSIS — C786 Secondary malignant neoplasm of retroperitoneum and peritoneum: Secondary | ICD-10-CM | POA: Diagnosis not present

## 2023-05-10 DIAGNOSIS — C679 Malignant neoplasm of bladder, unspecified: Secondary | ICD-10-CM | POA: Diagnosis not present

## 2023-05-10 DIAGNOSIS — N1832 Chronic kidney disease, stage 3b: Secondary | ICD-10-CM | POA: Diagnosis not present

## 2023-05-10 DIAGNOSIS — I2781 Cor pulmonale (chronic): Secondary | ICD-10-CM | POA: Diagnosis not present

## 2023-05-10 DIAGNOSIS — L03116 Cellulitis of left lower limb: Secondary | ICD-10-CM | POA: Diagnosis not present

## 2023-05-10 DIAGNOSIS — M80011G Age-related osteoporosis with current pathological fracture, right shoulder, subsequent encounter for fracture with delayed healing: Secondary | ICD-10-CM | POA: Diagnosis not present

## 2023-05-10 DIAGNOSIS — I1310 Hypertensive heart and chronic kidney disease without heart failure, with stage 1 through stage 4 chronic kidney disease, or unspecified chronic kidney disease: Secondary | ICD-10-CM | POA: Diagnosis not present

## 2023-05-10 DIAGNOSIS — D631 Anemia in chronic kidney disease: Secondary | ICD-10-CM | POA: Diagnosis not present

## 2023-05-12 DIAGNOSIS — S81812A Laceration without foreign body, left lower leg, initial encounter: Secondary | ICD-10-CM | POA: Diagnosis not present

## 2023-05-12 DIAGNOSIS — I1310 Hypertensive heart and chronic kidney disease without heart failure, with stage 1 through stage 4 chronic kidney disease, or unspecified chronic kidney disease: Secondary | ICD-10-CM | POA: Diagnosis not present

## 2023-05-12 DIAGNOSIS — D631 Anemia in chronic kidney disease: Secondary | ICD-10-CM | POA: Diagnosis not present

## 2023-05-12 DIAGNOSIS — L03116 Cellulitis of left lower limb: Secondary | ICD-10-CM | POA: Diagnosis not present

## 2023-05-12 DIAGNOSIS — N1832 Chronic kidney disease, stage 3b: Secondary | ICD-10-CM | POA: Diagnosis not present

## 2023-05-12 DIAGNOSIS — M80011G Age-related osteoporosis with current pathological fracture, right shoulder, subsequent encounter for fracture with delayed healing: Secondary | ICD-10-CM | POA: Diagnosis not present

## 2023-05-12 DIAGNOSIS — I2781 Cor pulmonale (chronic): Secondary | ICD-10-CM | POA: Diagnosis not present

## 2023-05-12 DIAGNOSIS — C786 Secondary malignant neoplasm of retroperitoneum and peritoneum: Secondary | ICD-10-CM | POA: Diagnosis not present

## 2023-05-12 DIAGNOSIS — C679 Malignant neoplasm of bladder, unspecified: Secondary | ICD-10-CM | POA: Diagnosis not present

## 2023-05-16 DIAGNOSIS — D631 Anemia in chronic kidney disease: Secondary | ICD-10-CM | POA: Diagnosis not present

## 2023-05-16 DIAGNOSIS — M80011G Age-related osteoporosis with current pathological fracture, right shoulder, subsequent encounter for fracture with delayed healing: Secondary | ICD-10-CM | POA: Diagnosis not present

## 2023-05-16 DIAGNOSIS — I2781 Cor pulmonale (chronic): Secondary | ICD-10-CM | POA: Diagnosis not present

## 2023-05-16 DIAGNOSIS — I1310 Hypertensive heart and chronic kidney disease without heart failure, with stage 1 through stage 4 chronic kidney disease, or unspecified chronic kidney disease: Secondary | ICD-10-CM | POA: Diagnosis not present

## 2023-05-16 DIAGNOSIS — L03116 Cellulitis of left lower limb: Secondary | ICD-10-CM | POA: Diagnosis not present

## 2023-05-16 DIAGNOSIS — N1832 Chronic kidney disease, stage 3b: Secondary | ICD-10-CM | POA: Diagnosis not present

## 2023-05-16 DIAGNOSIS — C679 Malignant neoplasm of bladder, unspecified: Secondary | ICD-10-CM | POA: Diagnosis not present

## 2023-05-16 DIAGNOSIS — S81812A Laceration without foreign body, left lower leg, initial encounter: Secondary | ICD-10-CM | POA: Diagnosis not present

## 2023-05-16 DIAGNOSIS — C786 Secondary malignant neoplasm of retroperitoneum and peritoneum: Secondary | ICD-10-CM | POA: Diagnosis not present

## 2023-05-19 DIAGNOSIS — I2781 Cor pulmonale (chronic): Secondary | ICD-10-CM | POA: Diagnosis not present

## 2023-05-19 DIAGNOSIS — M80011G Age-related osteoporosis with current pathological fracture, right shoulder, subsequent encounter for fracture with delayed healing: Secondary | ICD-10-CM | POA: Diagnosis not present

## 2023-05-19 DIAGNOSIS — C786 Secondary malignant neoplasm of retroperitoneum and peritoneum: Secondary | ICD-10-CM | POA: Diagnosis not present

## 2023-05-19 DIAGNOSIS — D631 Anemia in chronic kidney disease: Secondary | ICD-10-CM | POA: Diagnosis not present

## 2023-05-19 DIAGNOSIS — C679 Malignant neoplasm of bladder, unspecified: Secondary | ICD-10-CM | POA: Diagnosis not present

## 2023-05-19 DIAGNOSIS — S81812A Laceration without foreign body, left lower leg, initial encounter: Secondary | ICD-10-CM | POA: Diagnosis not present

## 2023-05-19 DIAGNOSIS — N1832 Chronic kidney disease, stage 3b: Secondary | ICD-10-CM | POA: Diagnosis not present

## 2023-05-19 DIAGNOSIS — I1310 Hypertensive heart and chronic kidney disease without heart failure, with stage 1 through stage 4 chronic kidney disease, or unspecified chronic kidney disease: Secondary | ICD-10-CM | POA: Diagnosis not present

## 2023-05-19 DIAGNOSIS — L03116 Cellulitis of left lower limb: Secondary | ICD-10-CM | POA: Diagnosis not present

## 2023-05-20 DIAGNOSIS — C679 Malignant neoplasm of bladder, unspecified: Secondary | ICD-10-CM | POA: Diagnosis not present

## 2023-05-20 DIAGNOSIS — D631 Anemia in chronic kidney disease: Secondary | ICD-10-CM | POA: Diagnosis not present

## 2023-05-20 DIAGNOSIS — I1310 Hypertensive heart and chronic kidney disease without heart failure, with stage 1 through stage 4 chronic kidney disease, or unspecified chronic kidney disease: Secondary | ICD-10-CM | POA: Diagnosis not present

## 2023-05-20 DIAGNOSIS — S81812A Laceration without foreign body, left lower leg, initial encounter: Secondary | ICD-10-CM | POA: Diagnosis not present

## 2023-05-20 DIAGNOSIS — I2781 Cor pulmonale (chronic): Secondary | ICD-10-CM | POA: Diagnosis not present

## 2023-05-20 DIAGNOSIS — C786 Secondary malignant neoplasm of retroperitoneum and peritoneum: Secondary | ICD-10-CM | POA: Diagnosis not present

## 2023-05-20 DIAGNOSIS — M80011G Age-related osteoporosis with current pathological fracture, right shoulder, subsequent encounter for fracture with delayed healing: Secondary | ICD-10-CM | POA: Diagnosis not present

## 2023-05-20 DIAGNOSIS — L03116 Cellulitis of left lower limb: Secondary | ICD-10-CM | POA: Diagnosis not present

## 2023-05-20 DIAGNOSIS — N1832 Chronic kidney disease, stage 3b: Secondary | ICD-10-CM | POA: Diagnosis not present

## 2023-05-23 DIAGNOSIS — D631 Anemia in chronic kidney disease: Secondary | ICD-10-CM | POA: Diagnosis not present

## 2023-05-23 DIAGNOSIS — C786 Secondary malignant neoplasm of retroperitoneum and peritoneum: Secondary | ICD-10-CM | POA: Diagnosis not present

## 2023-05-23 DIAGNOSIS — I1310 Hypertensive heart and chronic kidney disease without heart failure, with stage 1 through stage 4 chronic kidney disease, or unspecified chronic kidney disease: Secondary | ICD-10-CM | POA: Diagnosis not present

## 2023-05-23 DIAGNOSIS — N1832 Chronic kidney disease, stage 3b: Secondary | ICD-10-CM | POA: Diagnosis not present

## 2023-05-23 DIAGNOSIS — M80011G Age-related osteoporosis with current pathological fracture, right shoulder, subsequent encounter for fracture with delayed healing: Secondary | ICD-10-CM | POA: Diagnosis not present

## 2023-05-23 DIAGNOSIS — L03116 Cellulitis of left lower limb: Secondary | ICD-10-CM | POA: Diagnosis not present

## 2023-05-23 DIAGNOSIS — C679 Malignant neoplasm of bladder, unspecified: Secondary | ICD-10-CM | POA: Diagnosis not present

## 2023-05-23 DIAGNOSIS — S81812A Laceration without foreign body, left lower leg, initial encounter: Secondary | ICD-10-CM | POA: Diagnosis not present

## 2023-05-23 DIAGNOSIS — I2781 Cor pulmonale (chronic): Secondary | ICD-10-CM | POA: Diagnosis not present

## 2023-05-24 DIAGNOSIS — N1832 Chronic kidney disease, stage 3b: Secondary | ICD-10-CM | POA: Diagnosis not present

## 2023-05-24 DIAGNOSIS — M80011G Age-related osteoporosis with current pathological fracture, right shoulder, subsequent encounter for fracture with delayed healing: Secondary | ICD-10-CM | POA: Diagnosis not present

## 2023-05-24 DIAGNOSIS — I2781 Cor pulmonale (chronic): Secondary | ICD-10-CM | POA: Diagnosis not present

## 2023-05-24 DIAGNOSIS — C786 Secondary malignant neoplasm of retroperitoneum and peritoneum: Secondary | ICD-10-CM | POA: Diagnosis not present

## 2023-05-24 DIAGNOSIS — S81812A Laceration without foreign body, left lower leg, initial encounter: Secondary | ICD-10-CM | POA: Diagnosis not present

## 2023-05-24 DIAGNOSIS — D631 Anemia in chronic kidney disease: Secondary | ICD-10-CM | POA: Diagnosis not present

## 2023-05-24 DIAGNOSIS — L03116 Cellulitis of left lower limb: Secondary | ICD-10-CM | POA: Diagnosis not present

## 2023-05-24 DIAGNOSIS — I1310 Hypertensive heart and chronic kidney disease without heart failure, with stage 1 through stage 4 chronic kidney disease, or unspecified chronic kidney disease: Secondary | ICD-10-CM | POA: Diagnosis not present

## 2023-05-24 DIAGNOSIS — C679 Malignant neoplasm of bladder, unspecified: Secondary | ICD-10-CM | POA: Diagnosis not present

## 2023-05-26 DIAGNOSIS — M80011G Age-related osteoporosis with current pathological fracture, right shoulder, subsequent encounter for fracture with delayed healing: Secondary | ICD-10-CM | POA: Diagnosis not present

## 2023-05-26 DIAGNOSIS — L03116 Cellulitis of left lower limb: Secondary | ICD-10-CM | POA: Diagnosis not present

## 2023-05-26 DIAGNOSIS — D631 Anemia in chronic kidney disease: Secondary | ICD-10-CM | POA: Diagnosis not present

## 2023-05-26 DIAGNOSIS — C679 Malignant neoplasm of bladder, unspecified: Secondary | ICD-10-CM | POA: Diagnosis not present

## 2023-05-26 DIAGNOSIS — C786 Secondary malignant neoplasm of retroperitoneum and peritoneum: Secondary | ICD-10-CM | POA: Diagnosis not present

## 2023-05-26 DIAGNOSIS — I2781 Cor pulmonale (chronic): Secondary | ICD-10-CM | POA: Diagnosis not present

## 2023-05-26 DIAGNOSIS — S81812A Laceration without foreign body, left lower leg, initial encounter: Secondary | ICD-10-CM | POA: Diagnosis not present

## 2023-05-26 DIAGNOSIS — I1310 Hypertensive heart and chronic kidney disease without heart failure, with stage 1 through stage 4 chronic kidney disease, or unspecified chronic kidney disease: Secondary | ICD-10-CM | POA: Diagnosis not present

## 2023-05-26 DIAGNOSIS — N1832 Chronic kidney disease, stage 3b: Secondary | ICD-10-CM | POA: Diagnosis not present

## 2023-05-27 DIAGNOSIS — I1310 Hypertensive heart and chronic kidney disease without heart failure, with stage 1 through stage 4 chronic kidney disease, or unspecified chronic kidney disease: Secondary | ICD-10-CM | POA: Diagnosis not present

## 2023-05-27 DIAGNOSIS — C786 Secondary malignant neoplasm of retroperitoneum and peritoneum: Secondary | ICD-10-CM | POA: Diagnosis not present

## 2023-05-27 DIAGNOSIS — I2781 Cor pulmonale (chronic): Secondary | ICD-10-CM | POA: Diagnosis not present

## 2023-05-27 DIAGNOSIS — D631 Anemia in chronic kidney disease: Secondary | ICD-10-CM | POA: Diagnosis not present

## 2023-05-27 DIAGNOSIS — C679 Malignant neoplasm of bladder, unspecified: Secondary | ICD-10-CM | POA: Diagnosis not present

## 2023-05-27 DIAGNOSIS — N1832 Chronic kidney disease, stage 3b: Secondary | ICD-10-CM | POA: Diagnosis not present

## 2023-05-27 DIAGNOSIS — L03116 Cellulitis of left lower limb: Secondary | ICD-10-CM | POA: Diagnosis not present

## 2023-05-27 DIAGNOSIS — S81812A Laceration without foreign body, left lower leg, initial encounter: Secondary | ICD-10-CM | POA: Diagnosis not present

## 2023-05-27 DIAGNOSIS — M80011G Age-related osteoporosis with current pathological fracture, right shoulder, subsequent encounter for fracture with delayed healing: Secondary | ICD-10-CM | POA: Diagnosis not present

## 2023-05-29 DIAGNOSIS — C679 Malignant neoplasm of bladder, unspecified: Secondary | ICD-10-CM | POA: Diagnosis not present

## 2023-05-29 DIAGNOSIS — C799 Secondary malignant neoplasm of unspecified site: Secondary | ICD-10-CM | POA: Diagnosis not present

## 2023-05-31 DIAGNOSIS — N1832 Chronic kidney disease, stage 3b: Secondary | ICD-10-CM | POA: Diagnosis not present

## 2023-05-31 DIAGNOSIS — I2781 Cor pulmonale (chronic): Secondary | ICD-10-CM | POA: Diagnosis not present

## 2023-05-31 DIAGNOSIS — M80011G Age-related osteoporosis with current pathological fracture, right shoulder, subsequent encounter for fracture with delayed healing: Secondary | ICD-10-CM | POA: Diagnosis not present

## 2023-05-31 DIAGNOSIS — C786 Secondary malignant neoplasm of retroperitoneum and peritoneum: Secondary | ICD-10-CM | POA: Diagnosis not present

## 2023-05-31 DIAGNOSIS — C679 Malignant neoplasm of bladder, unspecified: Secondary | ICD-10-CM | POA: Diagnosis not present

## 2023-05-31 DIAGNOSIS — I1310 Hypertensive heart and chronic kidney disease without heart failure, with stage 1 through stage 4 chronic kidney disease, or unspecified chronic kidney disease: Secondary | ICD-10-CM | POA: Diagnosis not present

## 2023-05-31 DIAGNOSIS — S81812A Laceration without foreign body, left lower leg, initial encounter: Secondary | ICD-10-CM | POA: Diagnosis not present

## 2023-05-31 DIAGNOSIS — D631 Anemia in chronic kidney disease: Secondary | ICD-10-CM | POA: Diagnosis not present

## 2023-05-31 DIAGNOSIS — L03116 Cellulitis of left lower limb: Secondary | ICD-10-CM | POA: Diagnosis not present

## 2023-06-01 DIAGNOSIS — I1310 Hypertensive heart and chronic kidney disease without heart failure, with stage 1 through stage 4 chronic kidney disease, or unspecified chronic kidney disease: Secondary | ICD-10-CM | POA: Diagnosis not present

## 2023-06-01 DIAGNOSIS — L03116 Cellulitis of left lower limb: Secondary | ICD-10-CM | POA: Diagnosis not present

## 2023-06-01 DIAGNOSIS — I2781 Cor pulmonale (chronic): Secondary | ICD-10-CM | POA: Diagnosis not present

## 2023-06-01 DIAGNOSIS — C786 Secondary malignant neoplasm of retroperitoneum and peritoneum: Secondary | ICD-10-CM | POA: Diagnosis not present

## 2023-06-01 DIAGNOSIS — C679 Malignant neoplasm of bladder, unspecified: Secondary | ICD-10-CM | POA: Diagnosis not present

## 2023-06-01 DIAGNOSIS — S81812A Laceration without foreign body, left lower leg, initial encounter: Secondary | ICD-10-CM | POA: Diagnosis not present

## 2023-06-01 DIAGNOSIS — M80011G Age-related osteoporosis with current pathological fracture, right shoulder, subsequent encounter for fracture with delayed healing: Secondary | ICD-10-CM | POA: Diagnosis not present

## 2023-06-01 DIAGNOSIS — N1832 Chronic kidney disease, stage 3b: Secondary | ICD-10-CM | POA: Diagnosis not present

## 2023-06-01 DIAGNOSIS — D631 Anemia in chronic kidney disease: Secondary | ICD-10-CM | POA: Diagnosis not present

## 2023-06-02 DIAGNOSIS — L03116 Cellulitis of left lower limb: Secondary | ICD-10-CM | POA: Diagnosis not present

## 2023-06-02 DIAGNOSIS — S81812A Laceration without foreign body, left lower leg, initial encounter: Secondary | ICD-10-CM | POA: Diagnosis not present

## 2023-06-02 DIAGNOSIS — D631 Anemia in chronic kidney disease: Secondary | ICD-10-CM | POA: Diagnosis not present

## 2023-06-02 DIAGNOSIS — C786 Secondary malignant neoplasm of retroperitoneum and peritoneum: Secondary | ICD-10-CM | POA: Diagnosis not present

## 2023-06-02 DIAGNOSIS — I1310 Hypertensive heart and chronic kidney disease without heart failure, with stage 1 through stage 4 chronic kidney disease, or unspecified chronic kidney disease: Secondary | ICD-10-CM | POA: Diagnosis not present

## 2023-06-02 DIAGNOSIS — I2781 Cor pulmonale (chronic): Secondary | ICD-10-CM | POA: Diagnosis not present

## 2023-06-02 DIAGNOSIS — N1832 Chronic kidney disease, stage 3b: Secondary | ICD-10-CM | POA: Diagnosis not present

## 2023-06-02 DIAGNOSIS — C679 Malignant neoplasm of bladder, unspecified: Secondary | ICD-10-CM | POA: Diagnosis not present

## 2023-06-02 DIAGNOSIS — M80011G Age-related osteoporosis with current pathological fracture, right shoulder, subsequent encounter for fracture with delayed healing: Secondary | ICD-10-CM | POA: Diagnosis not present

## 2023-06-03 DIAGNOSIS — C679 Malignant neoplasm of bladder, unspecified: Secondary | ICD-10-CM | POA: Diagnosis not present

## 2023-06-03 DIAGNOSIS — I1310 Hypertensive heart and chronic kidney disease without heart failure, with stage 1 through stage 4 chronic kidney disease, or unspecified chronic kidney disease: Secondary | ICD-10-CM | POA: Diagnosis not present

## 2023-06-03 DIAGNOSIS — L03116 Cellulitis of left lower limb: Secondary | ICD-10-CM | POA: Diagnosis not present

## 2023-06-03 DIAGNOSIS — N1832 Chronic kidney disease, stage 3b: Secondary | ICD-10-CM | POA: Diagnosis not present

## 2023-06-03 DIAGNOSIS — S81812A Laceration without foreign body, left lower leg, initial encounter: Secondary | ICD-10-CM | POA: Diagnosis not present

## 2023-06-03 DIAGNOSIS — C786 Secondary malignant neoplasm of retroperitoneum and peritoneum: Secondary | ICD-10-CM | POA: Diagnosis not present

## 2023-06-03 DIAGNOSIS — I2781 Cor pulmonale (chronic): Secondary | ICD-10-CM | POA: Diagnosis not present

## 2023-06-03 DIAGNOSIS — M80011G Age-related osteoporosis with current pathological fracture, right shoulder, subsequent encounter for fracture with delayed healing: Secondary | ICD-10-CM | POA: Diagnosis not present

## 2023-06-03 DIAGNOSIS — D631 Anemia in chronic kidney disease: Secondary | ICD-10-CM | POA: Diagnosis not present

## 2023-06-07 DIAGNOSIS — I2781 Cor pulmonale (chronic): Secondary | ICD-10-CM | POA: Diagnosis not present

## 2023-06-07 DIAGNOSIS — S81812A Laceration without foreign body, left lower leg, initial encounter: Secondary | ICD-10-CM | POA: Diagnosis not present

## 2023-06-07 DIAGNOSIS — C786 Secondary malignant neoplasm of retroperitoneum and peritoneum: Secondary | ICD-10-CM | POA: Diagnosis not present

## 2023-06-07 DIAGNOSIS — N1832 Chronic kidney disease, stage 3b: Secondary | ICD-10-CM | POA: Diagnosis not present

## 2023-06-07 DIAGNOSIS — L03116 Cellulitis of left lower limb: Secondary | ICD-10-CM | POA: Diagnosis not present

## 2023-06-07 DIAGNOSIS — C679 Malignant neoplasm of bladder, unspecified: Secondary | ICD-10-CM | POA: Diagnosis not present

## 2023-06-07 DIAGNOSIS — M80011G Age-related osteoporosis with current pathological fracture, right shoulder, subsequent encounter for fracture with delayed healing: Secondary | ICD-10-CM | POA: Diagnosis not present

## 2023-06-07 DIAGNOSIS — I1310 Hypertensive heart and chronic kidney disease without heart failure, with stage 1 through stage 4 chronic kidney disease, or unspecified chronic kidney disease: Secondary | ICD-10-CM | POA: Diagnosis not present

## 2023-06-07 DIAGNOSIS — D631 Anemia in chronic kidney disease: Secondary | ICD-10-CM | POA: Diagnosis not present

## 2023-06-09 DIAGNOSIS — S81812A Laceration without foreign body, left lower leg, initial encounter: Secondary | ICD-10-CM | POA: Diagnosis not present

## 2023-06-09 DIAGNOSIS — D631 Anemia in chronic kidney disease: Secondary | ICD-10-CM | POA: Diagnosis not present

## 2023-06-09 DIAGNOSIS — C679 Malignant neoplasm of bladder, unspecified: Secondary | ICD-10-CM | POA: Diagnosis not present

## 2023-06-09 DIAGNOSIS — I1310 Hypertensive heart and chronic kidney disease without heart failure, with stage 1 through stage 4 chronic kidney disease, or unspecified chronic kidney disease: Secondary | ICD-10-CM | POA: Diagnosis not present

## 2023-06-09 DIAGNOSIS — C786 Secondary malignant neoplasm of retroperitoneum and peritoneum: Secondary | ICD-10-CM | POA: Diagnosis not present

## 2023-06-09 DIAGNOSIS — M80011G Age-related osteoporosis with current pathological fracture, right shoulder, subsequent encounter for fracture with delayed healing: Secondary | ICD-10-CM | POA: Diagnosis not present

## 2023-06-09 DIAGNOSIS — I2781 Cor pulmonale (chronic): Secondary | ICD-10-CM | POA: Diagnosis not present

## 2023-06-09 DIAGNOSIS — L03116 Cellulitis of left lower limb: Secondary | ICD-10-CM | POA: Diagnosis not present

## 2023-06-09 DIAGNOSIS — N1832 Chronic kidney disease, stage 3b: Secondary | ICD-10-CM | POA: Diagnosis not present

## 2023-06-15 DIAGNOSIS — N1832 Chronic kidney disease, stage 3b: Secondary | ICD-10-CM | POA: Diagnosis not present

## 2023-06-15 DIAGNOSIS — M80011G Age-related osteoporosis with current pathological fracture, right shoulder, subsequent encounter for fracture with delayed healing: Secondary | ICD-10-CM | POA: Diagnosis not present

## 2023-06-15 DIAGNOSIS — I1310 Hypertensive heart and chronic kidney disease without heart failure, with stage 1 through stage 4 chronic kidney disease, or unspecified chronic kidney disease: Secondary | ICD-10-CM | POA: Diagnosis not present

## 2023-06-15 DIAGNOSIS — I2781 Cor pulmonale (chronic): Secondary | ICD-10-CM | POA: Diagnosis not present

## 2023-06-15 DIAGNOSIS — C679 Malignant neoplasm of bladder, unspecified: Secondary | ICD-10-CM | POA: Diagnosis not present

## 2023-06-15 DIAGNOSIS — S81812A Laceration without foreign body, left lower leg, initial encounter: Secondary | ICD-10-CM | POA: Diagnosis not present

## 2023-06-15 DIAGNOSIS — L03116 Cellulitis of left lower limb: Secondary | ICD-10-CM | POA: Diagnosis not present

## 2023-06-15 DIAGNOSIS — C786 Secondary malignant neoplasm of retroperitoneum and peritoneum: Secondary | ICD-10-CM | POA: Diagnosis not present

## 2023-06-15 DIAGNOSIS — D631 Anemia in chronic kidney disease: Secondary | ICD-10-CM | POA: Diagnosis not present

## 2023-06-20 DIAGNOSIS — D631 Anemia in chronic kidney disease: Secondary | ICD-10-CM | POA: Diagnosis not present

## 2023-06-20 DIAGNOSIS — N1832 Chronic kidney disease, stage 3b: Secondary | ICD-10-CM | POA: Diagnosis not present

## 2023-06-20 DIAGNOSIS — I2781 Cor pulmonale (chronic): Secondary | ICD-10-CM | POA: Diagnosis not present

## 2023-06-20 DIAGNOSIS — L03116 Cellulitis of left lower limb: Secondary | ICD-10-CM | POA: Diagnosis not present

## 2023-06-20 DIAGNOSIS — S81812A Laceration without foreign body, left lower leg, initial encounter: Secondary | ICD-10-CM | POA: Diagnosis not present

## 2023-06-20 DIAGNOSIS — C786 Secondary malignant neoplasm of retroperitoneum and peritoneum: Secondary | ICD-10-CM | POA: Diagnosis not present

## 2023-06-20 DIAGNOSIS — I1310 Hypertensive heart and chronic kidney disease without heart failure, with stage 1 through stage 4 chronic kidney disease, or unspecified chronic kidney disease: Secondary | ICD-10-CM | POA: Diagnosis not present

## 2023-06-20 DIAGNOSIS — M80011G Age-related osteoporosis with current pathological fracture, right shoulder, subsequent encounter for fracture with delayed healing: Secondary | ICD-10-CM | POA: Diagnosis not present

## 2023-06-20 DIAGNOSIS — C679 Malignant neoplasm of bladder, unspecified: Secondary | ICD-10-CM | POA: Diagnosis not present

## 2023-06-22 DIAGNOSIS — I2781 Cor pulmonale (chronic): Secondary | ICD-10-CM | POA: Diagnosis not present

## 2023-06-22 DIAGNOSIS — D631 Anemia in chronic kidney disease: Secondary | ICD-10-CM | POA: Diagnosis not present

## 2023-06-22 DIAGNOSIS — C679 Malignant neoplasm of bladder, unspecified: Secondary | ICD-10-CM | POA: Diagnosis not present

## 2023-06-22 DIAGNOSIS — S81812A Laceration without foreign body, left lower leg, initial encounter: Secondary | ICD-10-CM | POA: Diagnosis not present

## 2023-06-22 DIAGNOSIS — L03116 Cellulitis of left lower limb: Secondary | ICD-10-CM | POA: Diagnosis not present

## 2023-06-22 DIAGNOSIS — I1310 Hypertensive heart and chronic kidney disease without heart failure, with stage 1 through stage 4 chronic kidney disease, or unspecified chronic kidney disease: Secondary | ICD-10-CM | POA: Diagnosis not present

## 2023-06-22 DIAGNOSIS — N1832 Chronic kidney disease, stage 3b: Secondary | ICD-10-CM | POA: Diagnosis not present

## 2023-06-22 DIAGNOSIS — C786 Secondary malignant neoplasm of retroperitoneum and peritoneum: Secondary | ICD-10-CM | POA: Diagnosis not present

## 2023-06-22 DIAGNOSIS — M80011G Age-related osteoporosis with current pathological fracture, right shoulder, subsequent encounter for fracture with delayed healing: Secondary | ICD-10-CM | POA: Diagnosis not present

## 2023-06-28 DIAGNOSIS — C799 Secondary malignant neoplasm of unspecified site: Secondary | ICD-10-CM | POA: Diagnosis not present

## 2023-06-28 DIAGNOSIS — C679 Malignant neoplasm of bladder, unspecified: Secondary | ICD-10-CM | POA: Diagnosis not present

## 2023-07-12 NOTE — Progress Notes (Unsigned)
[facility]  [address] [place],  [place]  [address] [phone]  Clinic Day:  07/12/2023  Referring physician: Abner Greenspan, MD   HISTORY OF PRESENT ILLNESS:  The patient is a 89 y.o. 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  She comes in today to go over her CT scans ascertain her new disease baseline.  Since her last visit, the patient has been doing fairly well.  She denies having abdominal or back pain or any urinary symptoms which concern her for overt signs of disease recurrence/progression.   She initially presented with right renal pelvis cancer diagnosed in 2021.  She underwent a robotic-assisted right nephroureterectomy and right adrenalectomy in August 2021.  This was followed by 4 cycles of adjuvant carboplatin/gemcitabine, which were completed in December 2021.  As disease recurrence was eventually found, she was placed on nivolumab in September 2022.  She completed 7 cycles of nivolumab, with the last cycle being given in late March 2023.  Scans done which showed a near complete response to therapy.  Due to her age and other comorbidities, her nivolumab was discontinued.  Fortunately, since then, follow-up scans have not shown any obvious evidence of disease recurrence.   PHYSICAL EXAM:  There were no vitals taken for this visit. Wt Readings from Last 3 Encounters:  01/11/23 146 lb 3.2 oz (66.3 kg)  12/09/22 147 lb (66.7 kg)  12/09/22 147 lb 9.6 oz (67 kg)   There is no height or weight on file to calculate BMI. Performance status (ECOG): 2 - Symptomatic, <50% confined to bed Physical Exam Constitutional:      Appearance: Normal appearance. She is not ill-appearing.     Comments: She ambulates with a cane.  HENT:     Mouth/Throat:     Mouth: Mucous membranes are moist.     Pharynx: Oropharynx is clear. No oropharyngeal exudate or posterior oropharyngeal erythema.  Cardiovascular:      Rate and Rhythm: Normal rate and regular rhythm.     Heart sounds: No murmur heard.    No friction rub. No gallop.  Pulmonary:     Effort: Pulmonary effort is normal. No respiratory distress.     Breath sounds: Normal breath sounds. No wheezing, rhonchi or rales.  Abdominal:     General: Bowel sounds are normal. There is no distension.     Palpations: Abdomen is soft. There is no mass.     Tenderness: There is no abdominal tenderness.  Musculoskeletal:        General: No swelling.     Right lower leg: No edema.     Left lower leg: No edema.  Lymphadenopathy:     Cervical: No cervical adenopathy.     Upper Body:     Right upper body: No supraclavicular or axillary adenopathy.     Left upper body: No supraclavicular or axillary adenopathy.     Lower Body: No right inguinal adenopathy. No left inguinal adenopathy.  Skin:    General: Skin is warm.     Coloration: Skin is not jaundiced.     Findings: No lesion or rash.  Neurological:     General: No focal deficit present.     Mental Status: She is alert and oriented to person, place, and time. Mental status is at baseline.  Psychiatric:        Mood and Affect: Mood normal.        Behavior: Behavior normal.  Thought Content: Thought content normal.    LABS:      Latest Ref Rng & Units 01/10/2023   12:00 AM 12/09/2022   12:00 AM 09/09/2022   10:08 AM  CBC  WBC  3.1     4.2     3.9   Hemoglobin 12.0 - 16.0 12.1     12.5     12.3   Hematocrit 36 - 46 37     38     37.9   Platelets 150 - 400 K/uL 176     171     252      This result is from an external source.      Latest Ref Rng & Units 01/10/2023   12:00 AM 12/09/2022    1:20 PM 09/09/2022   10:08 AM  CMP  Glucose 70 - 99 mg/dL  91  87   BUN 4 - 21 28     30  27    Creatinine 0.5 - 1.1 1.1     1.11  1.15   Sodium 137 - 147 137     143  141   Potassium 3.5 - 5.1 mEq/L 4.0     4.4  4.5   Chloride 99 - 108 100     102  103   CO2 13 - 22 32     30  35   Calcium 8.7 -  10.7 10.1     10.5  10.8   Total Protein 6.5 - 8.1 g/dL  7.1  6.4   Total Bilirubin 0.3 - 1.2 mg/dL  0.5  0.5   Alkaline Phos 25 - 125 82     73  67   AST 13 - 35 37     28  23   ALT 7 - 35 U/L 27     25  18       This result is from an external source.   Lab Results  Component Value Date   LDH 139 12/09/2022   STUDIES: CT scans of her abdomen/pelvis revealed the following:  FINDINGS: CT CHEST FINDINGS  Cardiovascular: No acute vascular findings. There is atherosclerosis of the aorta, great vessels and coronary arteries. Stable cardiomegaly without significant pericardial fluid.  Mediastinum/Nodes: There are no enlarged mediastinal, hilar or axillary lymph nodes. The thyroid gland, trachea and esophagus demonstrate no significant findings.  Lungs/Pleura: Trace left pleural effusion. Stable mild centrilobular emphysema, biapical scarring and chronic left lower lobe atelectasis or scarring. The previously demonstrated part solid left upper lobe nodule has contracted and now appears more linear, measuring 7 x 3 mm on image 26/301, likely of focus of postinflammatory scarring. No suspicious pulmonary nodules.  Musculoskeletal/Chest wall: No chest wall mass or suspicious osseous findings. Old fractures of the proximal right humerus, bilateral ribs and thoracic spine are grossly stable.  CT ABDOMEN AND PELVIS FINDINGS  Hepatobiliary: The liver is normal in density without suspicious focal abnormality. No evidence of gallstones, gallbladder wall thickening or biliary dilatation.  Pancreas: Unremarkable. No pancreatic ductal dilatation or surrounding inflammatory changes.  Spleen: Normal in size without focal abnormality.  Adrenals/Urinary Tract: Stable postsurgical changes from right nephro ureterectomy. No evidence of mass lesion in the nephrectomy bed. Stable 2.2 x 1.3 cm left adrenal nodule which has been previously characterized as an adenoma. The right adrenal gland  is not clearly visualized. No suspicious left renal findings. No evidence of urinary tract calculus or hydronephrosis. The bladder appears normal for its degree of distention.  Stomach/Bowel: Enteric contrast has passed into the mid colon. The stomach appears unremarkable for its degree of distension. No evidence of bowel wall thickening, distention or surrounding inflammatory change. Duodenal diverticula are again noted. There is a sigmoid colon anastomosis. There is moderate stool throughout the colon.  Vascular/Lymphatic: There are no enlarged abdominal or pelvic lymph nodes. Diffuse aortic and branch vessel atherosclerosis without evidence of large vessel occlusion. The left femoral artery appears attenuated in the proximal left thigh.  Reproductive: Hysterectomy. No adnexal mass.  Other: No evidence of abdominal wall mass or hernia. No ascites.  Musculoskeletal: No acute or significant osseous findings. Stable degenerative changes in the spine associated with a convex left thoracolumbar scoliosis.  IMPRESSION: 1. No evidence of local recurrence or metastatic disease in the chest, abdomen or pelvis. 2. The previously demonstrated part solid left upper lobe pulmonary nodule has contracted and now appears more linear, likely of postinflammatory scarring. 3. Stable postsurgical changes from right nephro ureterectomy. 4. Stable left adrenal nodule, previously characterized as an adenoma. 5. Aortic Atherosclerosis (ICD10-I70.0) and Emphysema (ICD10-J43.9).  ASSESSMENT & PLAN:  Assessment/Plan:  A 89 y.o. female with stage IV high grade urothelial carcinoma, who was on nivolumab immunotherapy up until March 2023.  In clinic today, I went over her CT scan images, for which she could see she remains disease free.  Understandably, the patient was pleased with her CT scan images.  I will remain conservative with her disease management.  I will see her back in 6 months for repeat  clinical assessment.  The patient understands all the plans discussed today and is in agreement with them.      Royer Cristobal Kirby Funk, MD

## 2023-07-13 ENCOUNTER — Inpatient Hospital Stay: Payer: Medicare PPO | Admitting: Dietician

## 2023-07-13 ENCOUNTER — Other Ambulatory Visit: Payer: Self-pay | Admitting: Oncology

## 2023-07-13 ENCOUNTER — Inpatient Hospital Stay: Payer: Medicare PPO

## 2023-07-13 ENCOUNTER — Inpatient Hospital Stay: Payer: Medicare PPO | Attending: Oncology | Admitting: Oncology

## 2023-07-13 VITALS — BP 139/66 | HR 48 | Temp 98.4°F | Resp 14 | Ht 65.0 in | Wt 135.2 lb

## 2023-07-13 DIAGNOSIS — C641 Malignant neoplasm of right kidney, except renal pelvis: Secondary | ICD-10-CM

## 2023-07-13 DIAGNOSIS — C651 Malignant neoplasm of right renal pelvis: Secondary | ICD-10-CM | POA: Diagnosis not present

## 2023-07-13 DIAGNOSIS — D649 Anemia, unspecified: Secondary | ICD-10-CM | POA: Diagnosis not present

## 2023-07-13 LAB — CMP (CANCER CENTER ONLY)
ALT: 25 U/L (ref 0–44)
AST: 31 U/L (ref 15–41)
Albumin: 4 g/dL (ref 3.5–5.0)
Alkaline Phosphatase: 59 U/L (ref 38–126)
Anion gap: 9 (ref 5–15)
BUN: 25 mg/dL — ABNORMAL HIGH (ref 8–23)
CO2: 29 mmol/L (ref 22–32)
Calcium: 10.3 mg/dL (ref 8.9–10.3)
Chloride: 98 mmol/L (ref 98–111)
Creatinine: 1.09 mg/dL — ABNORMAL HIGH (ref 0.44–1.00)
GFR, Estimated: 48 mL/min — ABNORMAL LOW (ref >60)
Glucose, Bld: 82 mg/dL (ref 70–99)
Potassium: 4.5 mmol/L (ref 3.5–5.1)
Sodium: 136 mmol/L (ref 135–145)
Total Bilirubin: 0.6 mg/dL (ref 0.3–1.2)
Total Protein: 6.5 g/dL (ref 6.5–8.1)

## 2023-07-13 LAB — LACTATE DEHYDROGENASE: LDH: 134 U/L (ref 98–192)

## 2023-07-13 LAB — CBC AND DIFFERENTIAL
HCT: 36 (ref 36–46)
Hemoglobin: 11.5 — AB (ref 12.0–16.0)
Neutrophils Absolute: 0.78
Platelets: 165 10*3/uL (ref 150–400)
WBC: 2.9

## 2023-07-13 LAB — CBC: RBC: 4.31 (ref 3.87–5.11)

## 2023-07-13 MED ORDER — MEGESTROL ACETATE 400 MG/10ML PO SUSP: 800.0000 mg | Freq: Every day | ORAL | 2 refills | Status: DC

## 2023-07-13 NOTE — Progress Notes (Signed)
Nutrition Assessment   Reason for Assessment: Referral for weight loss.    ASSESSMENT: Patient is a 89 y.o. 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   Due to age and co morbidities she is not currently receiving any active treatment.  She is followed by Dr. Melvyn Neth who is concerned with recent weight loss.  He has prescribed Megace to aid in appetite.  She also reports new dentures which make chewing take longer and she feels fuller faster. Patient reports she eats 2 meals and light supper daily.  Usual intake: Breakfast: [place] yogurt with peaches, Nutra grain bar, or Pizza balls and Nutra grain bar, or cereal with milk banana and raisins 2pm is main meal: Meat & 2 vegetables (pinto beans, sauerkraut, mixed vegetables, sweet potatoes, broccoli, cabbage) Supper: apple & one peanut butter and cracker at night Fluids: coffee (1.5 cups blacks) Crystal light iced tea,was drinking some electrolyte drink but not anymore, did just buy a Rock[place]bin protein shake after meeting with MD today (she like chocolate flavor)    Anthropometrics:  weight loss 11# (7.5%) past 6 month, approaching significant  Height: 65" Weight: 135# UBW: 145-150# BMI: 22.5    NUTRITION DIAGNOSIS: Inadequate PO intake to meet increased nutrient needs, r/t cancer diagnosis  INTERVENTION:  Relayed that nutrition services are wrap around service provided at no charge and encouraged continued communication if experiencing continued weight loss or any nutritional impact symptoms (NIS). Educated on importance of adequate calorie and protein energy intake  with nutrient dense foods when possible to maintain weight/strength Suggested adding [place] yogurt daily to supper meal, adding glass of juice at breakfast meal, and oral nutrition supplement QD Encouraged bimonthly weights. Emailed Nutrition Tip sheet  for  High Protein High Calorie Snacking with contact information  provided   MONITORING, EVALUATION, GOAL: weight, PO intake, Nutrition Impact Symptoms, labs Goal is weight maintenance  Next Visit: PRN at patient or provider request  Gennaro Africa, RDN, LDN Registered Dietitian, [facility] Part Time Remote (Usual office hours: Tuesday-Thursday) Cell: [phone]

## 2023-07-29 DIAGNOSIS — C799 Secondary malignant neoplasm of unspecified site: Secondary | ICD-10-CM | POA: Diagnosis not present

## 2023-07-29 DIAGNOSIS — C679 Malignant neoplasm of bladder, unspecified: Secondary | ICD-10-CM | POA: Diagnosis not present

## 2023-08-28 DIAGNOSIS — C799 Secondary malignant neoplasm of unspecified site: Secondary | ICD-10-CM | POA: Diagnosis not present

## 2023-08-28 DIAGNOSIS — C679 Malignant neoplasm of bladder, unspecified: Secondary | ICD-10-CM | POA: Diagnosis not present

## 2023-09-14 DIAGNOSIS — E039 Hypothyroidism, unspecified: Secondary | ICD-10-CM | POA: Diagnosis not present

## 2023-09-14 DIAGNOSIS — I129 Hypertensive chronic kidney disease with stage 1 through stage 4 chronic kidney disease, or unspecified chronic kidney disease: Secondary | ICD-10-CM | POA: Diagnosis not present

## 2023-09-14 DIAGNOSIS — E782 Mixed hyperlipidemia: Secondary | ICD-10-CM | POA: Diagnosis not present

## 2023-09-26 DIAGNOSIS — Z6823 Body mass index (BMI) 23.0-23.9, adult: Secondary | ICD-10-CM | POA: Diagnosis not present

## 2023-09-26 DIAGNOSIS — E039 Hypothyroidism, unspecified: Secondary | ICD-10-CM | POA: Diagnosis not present

## 2023-09-26 DIAGNOSIS — E782 Mixed hyperlipidemia: Secondary | ICD-10-CM | POA: Diagnosis not present

## 2023-09-26 DIAGNOSIS — I129 Hypertensive chronic kidney disease with stage 1 through stage 4 chronic kidney disease, or unspecified chronic kidney disease: Secondary | ICD-10-CM | POA: Diagnosis not present

## 2023-09-26 DIAGNOSIS — N183 Chronic kidney disease, stage 3 unspecified: Secondary | ICD-10-CM | POA: Diagnosis not present

## 2023-09-28 DIAGNOSIS — C679 Malignant neoplasm of bladder, unspecified: Secondary | ICD-10-CM | POA: Diagnosis not present

## 2023-09-28 DIAGNOSIS — C799 Secondary malignant neoplasm of unspecified site: Secondary | ICD-10-CM | POA: Diagnosis not present

## 2023-10-24 DIAGNOSIS — E039 Hypothyroidism, unspecified: Secondary | ICD-10-CM | POA: Diagnosis not present

## 2023-10-26 DIAGNOSIS — H353133 Nonexudative age-related macular degeneration, bilateral, advanced atrophic without subfoveal involvement: Secondary | ICD-10-CM | POA: Diagnosis not present

## 2023-10-26 DIAGNOSIS — H524 Presbyopia: Secondary | ICD-10-CM | POA: Diagnosis not present

## 2023-10-29 DIAGNOSIS — C799 Secondary malignant neoplasm of unspecified site: Secondary | ICD-10-CM | POA: Diagnosis not present

## 2023-10-29 DIAGNOSIS — C679 Malignant neoplasm of bladder, unspecified: Secondary | ICD-10-CM | POA: Diagnosis not present

## 2023-11-14 DIAGNOSIS — J40 Bronchitis, not specified as acute or chronic: Secondary | ICD-10-CM | POA: Diagnosis not present

## 2023-11-14 DIAGNOSIS — Z6824 Body mass index (BMI) 24.0-24.9, adult: Secondary | ICD-10-CM | POA: Diagnosis not present

## 2023-11-14 DIAGNOSIS — N1831 Chronic kidney disease, stage 3a: Secondary | ICD-10-CM | POA: Diagnosis not present

## 2023-11-14 DIAGNOSIS — R059 Cough, unspecified: Secondary | ICD-10-CM | POA: Diagnosis not present

## 2023-11-14 DIAGNOSIS — Z20822 Contact with and (suspected) exposure to covid-19: Secondary | ICD-10-CM | POA: Diagnosis not present

## 2023-11-14 DIAGNOSIS — R600 Localized edema: Secondary | ICD-10-CM | POA: Diagnosis not present

## 2023-11-16 DIAGNOSIS — I700 Atherosclerosis of aorta: Secondary | ICD-10-CM | POA: Diagnosis not present

## 2023-11-16 DIAGNOSIS — R600 Localized edema: Secondary | ICD-10-CM | POA: Diagnosis not present

## 2023-11-16 DIAGNOSIS — G0400 Acute disseminated encephalitis and encephalomyelitis, unspecified: Secondary | ICD-10-CM | POA: Diagnosis not present

## 2023-11-16 DIAGNOSIS — Z6824 Body mass index (BMI) 24.0-24.9, adult: Secondary | ICD-10-CM | POA: Diagnosis not present

## 2023-11-16 DIAGNOSIS — K5900 Constipation, unspecified: Secondary | ICD-10-CM | POA: Diagnosis not present

## 2023-11-17 DIAGNOSIS — K5900 Constipation, unspecified: Secondary | ICD-10-CM | POA: Diagnosis not present

## 2023-11-21 DIAGNOSIS — J811 Chronic pulmonary edema: Secondary | ICD-10-CM | POA: Diagnosis not present

## 2023-11-21 DIAGNOSIS — J90 Pleural effusion, not elsewhere classified: Secondary | ICD-10-CM | POA: Diagnosis not present

## 2023-11-21 DIAGNOSIS — R609 Edema, unspecified: Secondary | ICD-10-CM | POA: Diagnosis not present

## 2023-11-21 DIAGNOSIS — Z6825 Body mass index (BMI) 25.0-25.9, adult: Secondary | ICD-10-CM | POA: Diagnosis not present

## 2023-11-21 DIAGNOSIS — R600 Localized edema: Secondary | ICD-10-CM | POA: Diagnosis not present

## 2023-11-21 DIAGNOSIS — J189 Pneumonia, unspecified organism: Secondary | ICD-10-CM | POA: Diagnosis not present

## 2023-11-21 DIAGNOSIS — R918 Other nonspecific abnormal finding of lung field: Secondary | ICD-10-CM | POA: Diagnosis not present

## 2023-11-21 DIAGNOSIS — K5900 Constipation, unspecified: Secondary | ICD-10-CM | POA: Diagnosis not present

## 2023-11-26 DIAGNOSIS — C679 Malignant neoplasm of bladder, unspecified: Secondary | ICD-10-CM | POA: Diagnosis not present

## 2023-11-26 DIAGNOSIS — C799 Secondary malignant neoplasm of unspecified site: Secondary | ICD-10-CM | POA: Diagnosis not present

## 2023-12-01 DIAGNOSIS — R600 Localized edema: Secondary | ICD-10-CM | POA: Diagnosis not present

## 2023-12-01 DIAGNOSIS — I361 Nonrheumatic tricuspid (valve) insufficiency: Secondary | ICD-10-CM | POA: Diagnosis not present

## 2023-12-01 DIAGNOSIS — I342 Nonrheumatic mitral (valve) stenosis: Secondary | ICD-10-CM | POA: Diagnosis not present

## 2023-12-09 DIAGNOSIS — R600 Localized edema: Secondary | ICD-10-CM | POA: Diagnosis not present

## 2023-12-09 DIAGNOSIS — N1832 Chronic kidney disease, stage 3b: Secondary | ICD-10-CM | POA: Diagnosis not present

## 2023-12-09 DIAGNOSIS — Z6824 Body mass index (BMI) 24.0-24.9, adult: Secondary | ICD-10-CM | POA: Diagnosis not present

## 2023-12-09 DIAGNOSIS — Z20822 Contact with and (suspected) exposure to covid-19: Secondary | ICD-10-CM | POA: Diagnosis not present

## 2023-12-09 DIAGNOSIS — R001 Bradycardia, unspecified: Secondary | ICD-10-CM | POA: Diagnosis not present

## 2023-12-09 DIAGNOSIS — J189 Pneumonia, unspecified organism: Secondary | ICD-10-CM | POA: Diagnosis not present

## 2023-12-09 DIAGNOSIS — C679 Malignant neoplasm of bladder, unspecified: Secondary | ICD-10-CM | POA: Diagnosis not present

## 2023-12-11 DIAGNOSIS — J90 Pleural effusion, not elsewhere classified: Secondary | ICD-10-CM | POA: Diagnosis not present

## 2023-12-11 DIAGNOSIS — D62 Acute posthemorrhagic anemia: Secondary | ICD-10-CM | POA: Diagnosis not present

## 2023-12-11 DIAGNOSIS — J159 Unspecified bacterial pneumonia: Secondary | ICD-10-CM | POA: Diagnosis not present

## 2023-12-11 DIAGNOSIS — N179 Acute kidney failure, unspecified: Secondary | ICD-10-CM | POA: Diagnosis not present

## 2023-12-11 DIAGNOSIS — E860 Dehydration: Secondary | ICD-10-CM | POA: Diagnosis not present

## 2023-12-11 DIAGNOSIS — R001 Bradycardia, unspecified: Secondary | ICD-10-CM | POA: Diagnosis not present

## 2023-12-11 DIAGNOSIS — A419 Sepsis, unspecified organism: Secondary | ICD-10-CM | POA: Diagnosis not present

## 2023-12-11 DIAGNOSIS — R0602 Shortness of breath: Secondary | ICD-10-CM | POA: Diagnosis not present

## 2023-12-11 DIAGNOSIS — I10 Essential (primary) hypertension: Secondary | ICD-10-CM | POA: Diagnosis not present

## 2023-12-11 DIAGNOSIS — R918 Other nonspecific abnormal finding of lung field: Secondary | ICD-10-CM | POA: Diagnosis not present

## 2023-12-11 DIAGNOSIS — R531 Weakness: Secondary | ICD-10-CM | POA: Diagnosis not present

## 2023-12-11 DIAGNOSIS — J189 Pneumonia, unspecified organism: Secondary | ICD-10-CM | POA: Diagnosis not present

## 2023-12-11 DIAGNOSIS — J441 Chronic obstructive pulmonary disease with (acute) exacerbation: Secondary | ICD-10-CM | POA: Diagnosis not present

## 2023-12-11 DIAGNOSIS — J9601 Acute respiratory failure with hypoxia: Secondary | ICD-10-CM | POA: Diagnosis not present

## 2023-12-11 DIAGNOSIS — R042 Hemoptysis: Secondary | ICD-10-CM | POA: Diagnosis not present

## 2023-12-11 DIAGNOSIS — J440 Chronic obstructive pulmonary disease with acute lower respiratory infection: Secondary | ICD-10-CM | POA: Diagnosis not present

## 2023-12-12 ENCOUNTER — Other Ambulatory Visit (HOSPITAL_BASED_OUTPATIENT_CLINIC_OR_DEPARTMENT_OTHER): Payer: Self-pay | Admitting: Family Medicine

## 2023-12-12 ENCOUNTER — Encounter (HOSPITAL_BASED_OUTPATIENT_CLINIC_OR_DEPARTMENT_OTHER): Payer: Self-pay | Admitting: Family Medicine

## 2023-12-12 DIAGNOSIS — J129 Viral pneumonia, unspecified: Secondary | ICD-10-CM

## 2023-12-12 DIAGNOSIS — J189 Pneumonia, unspecified organism: Secondary | ICD-10-CM

## 2023-12-12 DIAGNOSIS — C679 Malignant neoplasm of bladder, unspecified: Secondary | ICD-10-CM

## 2023-12-12 DIAGNOSIS — Z905 Acquired absence of kidney: Secondary | ICD-10-CM

## 2023-12-12 DIAGNOSIS — N1832 Chronic kidney disease, stage 3b: Secondary | ICD-10-CM

## 2023-12-27 DEATH — deceased
